# Patient Record
Sex: Male | Born: 1945 | Race: White | Hispanic: No | Marital: Married | Smoking: Never smoker
Health system: Southern US, Community
[De-identification: ages and names within clinical notes are randomized; demographics above are authoritative.]

## PROBLEM LIST (undated history)

## (undated) DIAGNOSIS — Z86010 Personal history of colonic polyps: Secondary | ICD-10-CM

## (undated) DIAGNOSIS — C8590 Non-Hodgkin lymphoma, unspecified, unspecified site: Secondary | ICD-10-CM

## (undated) DIAGNOSIS — D649 Anemia, unspecified: Secondary | ICD-10-CM

## (undated) DIAGNOSIS — K219 Gastro-esophageal reflux disease without esophagitis: Secondary | ICD-10-CM

## (undated) DIAGNOSIS — I2510 Atherosclerotic heart disease of native coronary artery without angina pectoris: Secondary | ICD-10-CM

## (undated) DIAGNOSIS — F329 Major depressive disorder, single episode, unspecified: Secondary | ICD-10-CM

## (undated) DIAGNOSIS — F32A Depression, unspecified: Secondary | ICD-10-CM

## (undated) DIAGNOSIS — E785 Hyperlipidemia, unspecified: Secondary | ICD-10-CM

## (undated) DIAGNOSIS — M419 Scoliosis, unspecified: Secondary | ICD-10-CM

## (undated) DIAGNOSIS — M6740 Ganglion, unspecified site: Secondary | ICD-10-CM

## (undated) DIAGNOSIS — M8580 Other specified disorders of bone density and structure, unspecified site: Secondary | ICD-10-CM

## (undated) DIAGNOSIS — M4800 Spinal stenosis, site unspecified: Secondary | ICD-10-CM

## (undated) DIAGNOSIS — I10 Essential (primary) hypertension: Secondary | ICD-10-CM

## (undated) DIAGNOSIS — F419 Anxiety disorder, unspecified: Secondary | ICD-10-CM

## (undated) DIAGNOSIS — C4491 Basal cell carcinoma of skin, unspecified: Secondary | ICD-10-CM

## (undated) DIAGNOSIS — K4090 Unilateral inguinal hernia, without obstruction or gangrene, not specified as recurrent: Secondary | ICD-10-CM

## (undated) DIAGNOSIS — N400 Enlarged prostate without lower urinary tract symptoms: Secondary | ICD-10-CM

## (undated) HISTORY — DX: Anxiety disorder, unspecified: F41.9

## (undated) HISTORY — DX: Non-Hodgkin lymphoma, unspecified, unspecified site: C85.90

## (undated) HISTORY — PX: CERVICAL SPINE SURGERY: SHX589

## (undated) HISTORY — DX: Unilateral inguinal hernia, without obstruction or gangrene, not specified as recurrent: K40.90

## (undated) HISTORY — PX: HERNIA REPAIR: SHX51

## (undated) HISTORY — DX: Gastro-esophageal reflux disease without esophagitis: K21.9

## (undated) HISTORY — PX: CORONARY ARTERY BYPASS GRAFT: SHX141

## (undated) HISTORY — DX: Spinal stenosis, site unspecified: M48.00

## (undated) HISTORY — PX: RHINOPLASTY: SUR1284

## (undated) HISTORY — DX: Personal history of colonic polyps: Z86.010

## (undated) HISTORY — DX: Other specified disorders of bone density and structure, unspecified site: M85.80

## (undated) HISTORY — DX: Ganglion, unspecified site: M67.40

## (undated) HISTORY — PX: UPPER GASTROINTESTINAL ENDOSCOPY: SHX188

## (undated) HISTORY — DX: Anemia, unspecified: D64.9

## (undated) HISTORY — PX: OTHER SURGICAL HISTORY: SHX169

## (undated) HISTORY — DX: Depression, unspecified: F32.A

## (undated) HISTORY — DX: Benign prostatic hyperplasia without lower urinary tract symptoms: N40.0

## (undated) HISTORY — DX: Basal cell carcinoma of skin, unspecified: C44.91

## (undated) HISTORY — DX: Essential (primary) hypertension: I10

## (undated) HISTORY — DX: Hyperlipidemia, unspecified: E78.5

## (undated) HISTORY — DX: Scoliosis, unspecified: M41.9

## (undated) HISTORY — DX: Major depressive disorder, single episode, unspecified: F32.9

## (undated) HISTORY — DX: Atherosclerotic heart disease of native coronary artery without angina pectoris: I25.10

---

## 1997-06-18 HISTORY — PX: CORONARY ARTERY BYPASS GRAFT: SHX141

## 1997-12-27 ENCOUNTER — Ambulatory Visit (HOSPITAL_COMMUNITY): Admission: RE | Admit: 1997-12-27 | Discharge: 1997-12-27 | Payer: Self-pay | Admitting: Neurological Surgery

## 1998-01-11 ENCOUNTER — Ambulatory Visit (HOSPITAL_COMMUNITY): Admission: RE | Admit: 1998-01-11 | Discharge: 1998-01-11 | Payer: Self-pay | Admitting: Neurological Surgery

## 1998-01-26 ENCOUNTER — Ambulatory Visit (HOSPITAL_COMMUNITY): Admission: RE | Admit: 1998-01-26 | Discharge: 1998-01-26 | Payer: Self-pay | Admitting: Neurological Surgery

## 1998-04-19 ENCOUNTER — Inpatient Hospital Stay (HOSPITAL_COMMUNITY): Admission: AD | Admit: 1998-04-19 | Discharge: 1998-05-01 | Payer: Self-pay | Admitting: Cardiology

## 1998-04-22 ENCOUNTER — Encounter: Payer: Self-pay | Admitting: Surgery

## 1998-04-25 ENCOUNTER — Encounter: Payer: Self-pay | Admitting: Surgery

## 1998-04-26 ENCOUNTER — Encounter: Payer: Self-pay | Admitting: Surgery

## 1998-04-27 ENCOUNTER — Encounter: Payer: Self-pay | Admitting: Surgery

## 1998-05-07 ENCOUNTER — Encounter: Payer: Self-pay | Admitting: Internal Medicine

## 1998-05-07 ENCOUNTER — Inpatient Hospital Stay (HOSPITAL_COMMUNITY): Admission: EM | Admit: 1998-05-07 | Discharge: 1998-05-08 | Payer: Self-pay | Admitting: Emergency Medicine

## 1998-06-03 ENCOUNTER — Encounter (HOSPITAL_COMMUNITY): Admission: RE | Admit: 1998-06-03 | Discharge: 1998-09-01 | Payer: Self-pay | Admitting: Internal Medicine

## 1998-07-27 ENCOUNTER — Ambulatory Visit (HOSPITAL_COMMUNITY): Admission: RE | Admit: 1998-07-27 | Discharge: 1998-07-27 | Payer: Self-pay | Admitting: Gastroenterology

## 2000-06-18 HISTORY — PX: OTHER SURGICAL HISTORY: SHX169

## 2000-12-11 ENCOUNTER — Encounter (INDEPENDENT_AMBULATORY_CARE_PROVIDER_SITE_OTHER): Payer: Self-pay | Admitting: Specialist

## 2000-12-11 ENCOUNTER — Other Ambulatory Visit: Admission: RE | Admit: 2000-12-11 | Discharge: 2000-12-11 | Payer: Self-pay | Admitting: Urology

## 2002-09-24 ENCOUNTER — Encounter: Admission: RE | Admit: 2002-09-24 | Discharge: 2002-09-24 | Payer: Self-pay | Admitting: Neurological Surgery

## 2002-09-24 ENCOUNTER — Encounter: Payer: Self-pay | Admitting: Neurological Surgery

## 2002-11-20 ENCOUNTER — Encounter: Payer: Self-pay | Admitting: Neurological Surgery

## 2002-11-20 ENCOUNTER — Encounter: Admission: RE | Admit: 2002-11-20 | Discharge: 2002-11-20 | Payer: Self-pay | Admitting: Neurological Surgery

## 2003-04-15 ENCOUNTER — Encounter: Admission: RE | Admit: 2003-04-15 | Discharge: 2003-04-15 | Payer: Self-pay | Admitting: Surgery

## 2003-04-19 ENCOUNTER — Ambulatory Visit (HOSPITAL_COMMUNITY): Admission: RE | Admit: 2003-04-19 | Discharge: 2003-04-19 | Payer: Self-pay | Admitting: Surgery

## 2003-04-19 ENCOUNTER — Ambulatory Visit (HOSPITAL_BASED_OUTPATIENT_CLINIC_OR_DEPARTMENT_OTHER): Admission: RE | Admit: 2003-04-19 | Discharge: 2003-04-19 | Payer: Self-pay | Admitting: Surgery

## 2003-05-12 ENCOUNTER — Encounter: Admission: RE | Admit: 2003-05-12 | Discharge: 2003-05-12 | Payer: Self-pay | Admitting: Internal Medicine

## 2003-09-15 ENCOUNTER — Encounter: Admission: RE | Admit: 2003-09-15 | Discharge: 2003-09-15 | Payer: Self-pay | Admitting: Neurological Surgery

## 2004-02-18 ENCOUNTER — Encounter: Admission: RE | Admit: 2004-02-18 | Discharge: 2004-02-18 | Payer: Self-pay | Admitting: Neurological Surgery

## 2004-03-31 ENCOUNTER — Encounter: Admission: RE | Admit: 2004-03-31 | Discharge: 2004-03-31 | Payer: Self-pay | Admitting: Internal Medicine

## 2004-07-05 ENCOUNTER — Encounter: Admission: RE | Admit: 2004-07-05 | Discharge: 2004-07-05 | Payer: Self-pay | Admitting: Internal Medicine

## 2004-10-04 ENCOUNTER — Ambulatory Visit: Payer: Self-pay

## 2004-11-06 ENCOUNTER — Ambulatory Visit: Payer: Self-pay | Admitting: Internal Medicine

## 2004-11-08 ENCOUNTER — Ambulatory Visit: Payer: Self-pay | Admitting: Internal Medicine

## 2005-05-29 ENCOUNTER — Ambulatory Visit: Payer: Self-pay | Admitting: Internal Medicine

## 2005-11-27 ENCOUNTER — Ambulatory Visit: Payer: Self-pay | Admitting: Oncology

## 2005-11-27 LAB — CBC WITH DIFFERENTIAL/PLATELET
BASO%: 0.9 % (ref 0.0–2.0)
Basophils Absolute: 0.1 10*3/uL (ref 0.0–0.1)
EOS%: 0.8 % (ref 0.0–7.0)
Eosinophils Absolute: 0.1 10*3/uL (ref 0.0–0.5)
HCT: 39.8 % (ref 38.7–49.9)
HGB: 13.7 g/dL (ref 13.0–17.1)
LYMPH%: 14.8 % (ref 14.0–48.0)
MCH: 30.5 pg (ref 28.0–33.4)
MCHC: 34.5 g/dL (ref 32.0–35.9)
MCV: 88.5 fL (ref 81.6–98.0)
MONO#: 0.6 10*3/uL (ref 0.1–0.9)
MONO%: 7.5 % (ref 0.0–13.0)
NEUT#: 6.2 10*3/uL (ref 1.5–6.5)
NEUT%: 76 % — ABNORMAL HIGH (ref 40.0–75.0)
Platelets: 211 10*3/uL (ref 145–400)
RBC: 4.5 10*6/uL (ref 4.20–5.71)
RDW: 13.4 % (ref 11.2–14.6)
WBC: 8.2 10*3/uL (ref 4.0–10.0)
lymph#: 1.2 10*3/uL (ref 0.9–3.3)

## 2005-11-29 LAB — CEA: CEA: <0.5 ng/mL (ref 0.0–5.0)

## 2005-11-29 LAB — COMPREHENSIVE METABOLIC PANEL
ALT: 11 U/L (ref 0–40)
AST: 15 U/L (ref 0–37)
Albumin: 4.6 g/dL (ref 3.5–5.2)
Alkaline Phosphatase: 61 U/L (ref 39–117)
BUN: 17 mg/dL (ref 6–23)
CO2: 28 mEq/L (ref 19–32)
Calcium: 9.5 mg/dL (ref 8.4–10.5)
Chloride: 102 mEq/L (ref 96–112)
Creatinine, Ser: 1.22 mg/dL (ref 0.40–1.50)
Glucose, Bld: 88 mg/dL (ref 70–99)
Potassium: 4.3 mEq/L (ref 3.5–5.3)
Sodium: 139 mEq/L (ref 135–145)
Total Bilirubin: 0.8 mg/dL (ref 0.3–1.2)
Total Protein: 7.2 g/dL (ref 6.0–8.3)

## 2005-11-29 LAB — IMMUNOFIXATION ELECTROPHORESIS
IgA: 153 mg/dL (ref 68–378)
IgG (Immunoglobin G), Serum: 1000 mg/dL (ref 694–1618)
IgM, Serum: 144 mg/dL (ref 60–263)
Total Protein, Serum Electrophoresis: 7.2 g/dL (ref 6.0–8.3)

## 2005-11-29 LAB — LACTATE DEHYDROGENASE: LDH: 133 U/L (ref 94–250)

## 2005-11-29 LAB — BETA 2 MICROGLOBULIN, SERUM: Beta-2 Microglobulin: 1.74 mg/L — ABNORMAL HIGH (ref 1.01–1.73)

## 2005-11-30 ENCOUNTER — Ambulatory Visit (HOSPITAL_COMMUNITY): Admission: RE | Admit: 2005-11-30 | Discharge: 2005-11-30 | Payer: Self-pay | Admitting: Oncology

## 2005-12-03 ENCOUNTER — Ambulatory Visit (HOSPITAL_COMMUNITY): Admission: RE | Admit: 2005-12-03 | Discharge: 2005-12-03 | Payer: Self-pay | Admitting: Oncology

## 2005-12-03 ENCOUNTER — Encounter (INDEPENDENT_AMBULATORY_CARE_PROVIDER_SITE_OTHER): Payer: Self-pay | Admitting: *Deleted

## 2005-12-03 ENCOUNTER — Encounter (INDEPENDENT_AMBULATORY_CARE_PROVIDER_SITE_OTHER): Payer: Self-pay | Admitting: Interventional Radiology

## 2005-12-24 ENCOUNTER — Ambulatory Visit: Payer: Self-pay | Admitting: Internal Medicine

## 2006-01-14 ENCOUNTER — Ambulatory Visit: Payer: Self-pay | Admitting: Oncology

## 2006-01-15 LAB — CBC WITH DIFFERENTIAL/PLATELET
BASO%: 1.1 % (ref 0.0–2.0)
Basophils Absolute: 0.1 10*3/uL (ref 0.0–0.1)
EOS%: 1.6 % (ref 0.0–7.0)
Eosinophils Absolute: 0.1 10*3/uL (ref 0.0–0.5)
HCT: 40.2 % (ref 38.7–49.9)
HGB: 14.1 g/dL (ref 13.0–17.1)
LYMPH%: 24.2 % (ref 14.0–48.0)
MCH: 30.4 pg (ref 28.0–33.4)
MCHC: 35 g/dL (ref 32.0–35.9)
MCV: 86.7 fL (ref 81.6–98.0)
MONO#: 0.5 10*3/uL (ref 0.1–0.9)
MONO%: 10 % (ref 0.0–13.0)
NEUT#: 3 10*3/uL (ref 1.5–6.5)
NEUT%: 63.2 % (ref 40.0–75.0)
Platelets: 202 10*3/uL (ref 145–400)
RBC: 4.63 10*6/uL (ref 4.20–5.71)
RDW: 11.4 % (ref 11.2–14.6)
WBC: 4.8 10*3/uL (ref 4.0–10.0)
lymph#: 1.2 10*3/uL (ref 0.9–3.3)

## 2006-01-22 LAB — CBC WITH DIFFERENTIAL/PLATELET
BASO%: 1.6 % (ref 0.0–2.0)
Basophils Absolute: 0.1 10*3/uL (ref 0.0–0.1)
EOS%: 1.8 % (ref 0.0–7.0)
Eosinophils Absolute: 0.1 10*3/uL (ref 0.0–0.5)
HCT: 38.3 % — ABNORMAL LOW (ref 38.7–49.9)
HGB: 13.4 g/dL (ref 13.0–17.1)
LYMPH%: 18.6 % (ref 14.0–48.0)
MCH: 30.8 pg (ref 28.0–33.4)
MCHC: 35.1 g/dL (ref 32.0–35.9)
MCV: 87.6 fL (ref 81.6–98.0)
MONO#: 0.5 10*3/uL (ref 0.1–0.9)
MONO%: 10.5 % (ref 0.0–13.0)
NEUT#: 3.4 10*3/uL (ref 1.5–6.5)
NEUT%: 67.5 % (ref 40.0–75.0)
Platelets: 186 10*3/uL (ref 145–400)
RBC: 4.37 10*6/uL (ref 4.20–5.71)
RDW: 11.9 % (ref 11.2–14.6)
WBC: 5.1 10*3/uL (ref 4.0–10.0)
lymph#: 0.9 10*3/uL (ref 0.9–3.3)

## 2006-01-23 ENCOUNTER — Ambulatory Visit: Payer: Self-pay

## 2006-01-24 ENCOUNTER — Ambulatory Visit: Payer: Self-pay

## 2006-01-28 LAB — CBC WITH DIFFERENTIAL/PLATELET
BASO%: 0.9 % (ref 0.0–2.0)
Basophils Absolute: 0.1 10*3/uL (ref 0.0–0.1)
EOS%: 0.5 % (ref 0.0–7.0)
Eosinophils Absolute: 0 10*3/uL (ref 0.0–0.5)
HCT: 39.7 % (ref 38.7–49.9)
HGB: 13.7 g/dL (ref 13.0–17.1)
LYMPH%: 15.9 % (ref 14.0–48.0)
MCH: 30.9 pg (ref 28.0–33.4)
MCHC: 34.5 g/dL (ref 32.0–35.9)
MCV: 89.6 fL (ref 81.6–98.0)
MONO#: 0.7 10*3/uL (ref 0.1–0.9)
MONO%: 10.8 % (ref 0.0–13.0)
NEUT#: 4.4 10*3/uL (ref 1.5–6.5)
NEUT%: 71.9 % (ref 40.0–75.0)
Platelets: 223 10*3/uL (ref 145–400)
RBC: 4.43 10*6/uL (ref 4.20–5.71)
RDW: 13.4 % (ref 11.2–14.6)
WBC: 6.1 10*3/uL (ref 4.0–10.0)
lymph#: 1 10*3/uL (ref 0.9–3.3)

## 2006-02-04 ENCOUNTER — Ambulatory Visit: Payer: Self-pay | Admitting: Internal Medicine

## 2006-02-05 LAB — CBC WITH DIFFERENTIAL/PLATELET
BASO%: 1.7 % (ref 0.0–2.0)
Basophils Absolute: 0.1 10*3/uL (ref 0.0–0.1)
EOS%: 1.5 % (ref 0.0–7.0)
Eosinophils Absolute: 0.1 10*3/uL (ref 0.0–0.5)
HCT: 40.1 % (ref 38.7–49.9)
HGB: 14 g/dL (ref 13.0–17.1)
LYMPH%: 22 % (ref 14.0–48.0)
MCH: 30.9 pg (ref 28.0–33.4)
MCHC: 34.9 g/dL (ref 32.0–35.9)
MCV: 88.6 fL (ref 81.6–98.0)
MONO#: 0.7 10*3/uL (ref 0.1–0.9)
MONO%: 14.1 % — ABNORMAL HIGH (ref 0.0–13.0)
NEUT#: 2.8 10*3/uL (ref 1.5–6.5)
NEUT%: 60.8 % (ref 40.0–75.0)
Platelets: 209 10*3/uL (ref 145–400)
RBC: 4.52 10*6/uL (ref 4.20–5.71)
RDW: 12.2 % (ref 11.2–14.6)
WBC: 4.7 10*3/uL (ref 4.0–10.0)
lymph#: 1 10*3/uL (ref 0.9–3.3)

## 2006-02-11 LAB — CBC WITH DIFFERENTIAL/PLATELET
BASO%: 0.7 % (ref 0.0–2.0)
Basophils Absolute: 0.1 10*3/uL (ref 0.0–0.1)
EOS%: 0.4 % (ref 0.0–7.0)
Eosinophils Absolute: 0 10*3/uL (ref 0.0–0.5)
HCT: 37.7 % — ABNORMAL LOW (ref 38.7–49.9)
HGB: 13.5 g/dL (ref 13.0–17.1)
LYMPH%: 12 % — ABNORMAL LOW (ref 14.0–48.0)
MCH: 31.3 pg (ref 28.0–33.4)
MCHC: 35.7 g/dL (ref 32.0–35.9)
MCV: 87.6 fL (ref 81.6–98.0)
MONO#: 0.6 10*3/uL (ref 0.1–0.9)
MONO%: 7.3 % (ref 0.0–13.0)
NEUT#: 6.5 10*3/uL (ref 1.5–6.5)
NEUT%: 79.6 % — ABNORMAL HIGH (ref 40.0–75.0)
Platelets: 203 10*3/uL (ref 145–400)
RBC: 4.3 10*6/uL (ref 4.20–5.71)
RDW: 11.7 % (ref 11.2–14.6)
WBC: 8.1 10*3/uL (ref 4.0–10.0)
lymph#: 1 10*3/uL (ref 0.9–3.3)

## 2006-04-19 ENCOUNTER — Ambulatory Visit: Payer: Self-pay | Admitting: Oncology

## 2006-04-23 LAB — CBC WITH DIFFERENTIAL/PLATELET
BASO%: 0.7 % (ref 0.0–2.0)
Basophils Absolute: 0 10*3/uL (ref 0.0–0.1)
EOS%: 1.3 % (ref 0.0–7.0)
Eosinophils Absolute: 0.1 10*3/uL (ref 0.0–0.5)
HCT: 39.1 % (ref 38.7–49.9)
HGB: 13.5 g/dL (ref 13.0–17.1)
LYMPH%: 18.2 % (ref 14.0–48.0)
MCH: 31.1 pg (ref 28.0–33.4)
MCHC: 34.5 g/dL (ref 32.0–35.9)
MCV: 90 fL (ref 81.6–98.0)
MONO#: 0.5 10*3/uL (ref 0.1–0.9)
MONO%: 8.7 % (ref 0.0–13.0)
NEUT#: 4.2 10*3/uL (ref 1.5–6.5)
NEUT%: 71.1 % (ref 40.0–75.0)
Platelets: 213 10*3/uL (ref 145–400)
RBC: 4.35 10*6/uL (ref 4.20–5.71)
RDW: 12.9 % (ref 11.2–14.6)
WBC: 5.9 10*3/uL (ref 4.0–10.0)
lymph#: 1.1 10*3/uL (ref 0.9–3.3)

## 2006-04-24 LAB — COMPREHENSIVE METABOLIC PANEL
ALT: 16 U/L (ref 0–53)
AST: 17 U/L (ref 0–37)
Albumin: 4.4 g/dL (ref 3.5–5.2)
Alkaline Phosphatase: 77 U/L (ref 39–117)
BUN: 11 mg/dL (ref 6–23)
CO2: 31 mEq/L (ref 19–32)
Calcium: 8.9 mg/dL (ref 8.4–10.5)
Chloride: 100 mEq/L (ref 96–112)
Creatinine, Ser: 1.03 mg/dL (ref 0.40–1.50)
Glucose, Bld: 85 mg/dL (ref 70–99)
Potassium: 4.1 mEq/L (ref 3.5–5.3)
Sodium: 141 mEq/L (ref 135–145)
Total Bilirubin: 0.6 mg/dL (ref 0.3–1.2)
Total Protein: 6.8 g/dL (ref 6.0–8.3)

## 2006-04-24 LAB — BETA 2 MICROGLOBULIN, SERUM: Beta-2 Microglobulin: 1.64 mg/L (ref 1.01–1.73)

## 2006-04-24 LAB — LACTATE DEHYDROGENASE: LDH: 116 U/L (ref 94–250)

## 2006-05-13 LAB — CBC WITH DIFFERENTIAL/PLATELET
BASO%: 1.3 % (ref 0.0–2.0)
Basophils Absolute: 0.1 10*3/uL (ref 0.0–0.1)
EOS%: 1.1 % (ref 0.0–7.0)
Eosinophils Absolute: 0.1 10*3/uL (ref 0.0–0.5)
HCT: 42.1 % (ref 38.7–49.9)
HGB: 14.7 g/dL (ref 13.0–17.1)
LYMPH%: 15.2 % (ref 14.0–48.0)
MCH: 30.6 pg (ref 28.0–33.4)
MCHC: 35.1 g/dL (ref 32.0–35.9)
MCV: 87.2 fL (ref 81.6–98.0)
MONO#: 0.5 10*3/uL (ref 0.1–0.9)
MONO%: 8.8 % (ref 0.0–13.0)
NEUT#: 4.4 10*3/uL (ref 1.5–6.5)
NEUT%: 73.7 % (ref 40.0–75.0)
Platelets: 223 10*3/uL (ref 145–400)
RBC: 4.82 10*6/uL (ref 4.20–5.71)
RDW: 11.3 % (ref 11.2–14.6)
WBC: 6 10*3/uL (ref 4.0–10.0)
lymph#: 0.9 10*3/uL (ref 0.9–3.3)

## 2006-05-21 LAB — CBC WITH DIFFERENTIAL/PLATELET
BASO%: 1 % (ref 0.0–2.0)
Basophils Absolute: 0 10*3/uL (ref 0.0–0.1)
EOS%: 1.6 % (ref 0.0–7.0)
Eosinophils Absolute: 0.1 10*3/uL (ref 0.0–0.5)
HCT: 41 % (ref 38.7–49.9)
HGB: 14.3 g/dL (ref 13.0–17.1)
LYMPH%: 17.8 % (ref 14.0–48.0)
MCH: 30.4 pg (ref 28.0–33.4)
MCHC: 34.9 g/dL (ref 32.0–35.9)
MCV: 87.1 fL (ref 81.6–98.0)
MONO#: 0.4 10*3/uL (ref 0.1–0.9)
MONO%: 10.5 % (ref 0.0–13.0)
NEUT#: 3 10*3/uL (ref 1.5–6.5)
NEUT%: 69.1 % (ref 40.0–75.0)
Platelets: 209 10*3/uL (ref 145–400)
RBC: 4.7 10*6/uL (ref 4.20–5.71)
RDW: 11.1 % — ABNORMAL LOW (ref 11.2–14.6)
WBC: 4.3 10*3/uL (ref 4.0–10.0)
lymph#: 0.8 10*3/uL — ABNORMAL LOW (ref 0.9–3.3)

## 2006-05-28 LAB — CBC WITH DIFFERENTIAL/PLATELET
BASO%: 0.7 % (ref 0.0–2.0)
Basophils Absolute: 0 10*3/uL (ref 0.0–0.1)
EOS%: 1.6 % (ref 0.0–7.0)
Eosinophils Absolute: 0.1 10*3/uL (ref 0.0–0.5)
HCT: 39.8 % (ref 38.7–49.9)
HGB: 14.2 g/dL (ref 13.0–17.1)
LYMPH%: 11.7 % — ABNORMAL LOW (ref 14.0–48.0)
MCH: 30.8 pg (ref 28.0–33.4)
MCHC: 35.6 g/dL (ref 32.0–35.9)
MCV: 86.4 fL (ref 81.6–98.0)
MONO#: 0.5 10*3/uL (ref 0.1–0.9)
MONO%: 8 % (ref 0.0–13.0)
NEUT#: 4.7 10*3/uL (ref 1.5–6.5)
NEUT%: 78.1 % — ABNORMAL HIGH (ref 40.0–75.0)
Platelets: 207 10*3/uL (ref 145–400)
RBC: 4.61 10*6/uL (ref 4.20–5.71)
RDW: 11.4 % (ref 11.2–14.6)
WBC: 6 10*3/uL (ref 4.0–10.0)
lymph#: 0.7 10*3/uL — ABNORMAL LOW (ref 0.9–3.3)

## 2006-06-04 ENCOUNTER — Ambulatory Visit: Payer: Self-pay | Admitting: Oncology

## 2006-06-04 LAB — CBC WITH DIFFERENTIAL/PLATELET
BASO%: 0.6 % (ref 0.0–2.0)
Basophils Absolute: 0 10*3/uL (ref 0.0–0.1)
EOS%: 1.2 % (ref 0.0–7.0)
Eosinophils Absolute: 0 10*3/uL (ref 0.0–0.5)
HCT: 38.9 % (ref 38.7–49.9)
HGB: 13.3 g/dL (ref 13.0–17.1)
LYMPH%: 18 % (ref 14.0–48.0)
MCH: 29.9 pg (ref 28.0–33.4)
MCHC: 34.2 g/dL (ref 32.0–35.9)
MCV: 87.4 fL (ref 81.6–98.0)
MONO#: 0.5 10*3/uL (ref 0.1–0.9)
MONO%: 12.4 % (ref 0.0–13.0)
NEUT#: 2.5 10*3/uL (ref 1.5–6.5)
NEUT%: 67.8 % (ref 40.0–75.0)
Platelets: 192 10*3/uL (ref 145–400)
RBC: 4.45 10*6/uL (ref 4.20–5.71)
RDW: 11.7 % (ref 11.2–14.6)
WBC: 3.7 10*3/uL — ABNORMAL LOW (ref 4.0–10.0)
lymph#: 0.7 10*3/uL — ABNORMAL LOW (ref 0.9–3.3)

## 2006-06-19 LAB — CBC WITH DIFFERENTIAL/PLATELET
BASO%: 0.7 % (ref 0.0–2.0)
Basophils Absolute: 0 10*3/uL (ref 0.0–0.1)
EOS%: 1.4 % (ref 0.0–7.0)
Eosinophils Absolute: 0.1 10*3/uL (ref 0.0–0.5)
HCT: 40.6 % (ref 38.7–49.9)
HGB: 14.3 g/dL (ref 13.0–17.1)
LYMPH%: 11.1 % — ABNORMAL LOW (ref 14.0–48.0)
MCH: 30.7 pg (ref 28.0–33.4)
MCHC: 35.2 g/dL (ref 32.0–35.9)
MCV: 87.2 fL (ref 81.6–98.0)
MONO#: 0.5 10*3/uL (ref 0.1–0.9)
MONO%: 10.7 % (ref 0.0–13.0)
NEUT#: 3.8 10*3/uL (ref 1.5–6.5)
NEUT%: 76.1 % — ABNORMAL HIGH (ref 40.0–75.0)
Platelets: 194 10*3/uL (ref 145–400)
RBC: 4.65 10*6/uL (ref 4.20–5.71)
RDW: 12.1 % (ref 11.2–14.6)
WBC: 5 10*3/uL (ref 4.0–10.0)
lymph#: 0.5 10*3/uL — ABNORMAL LOW (ref 0.9–3.3)

## 2006-06-19 LAB — COMPREHENSIVE METABOLIC PANEL
ALT: 24 U/L (ref 0–53)
AST: 23 U/L (ref 0–37)
Albumin: 4.9 g/dL (ref 3.5–5.2)
Alkaline Phosphatase: 89 U/L (ref 39–117)
BUN: 17 mg/dL (ref 6–23)
CO2: 28 mEq/L (ref 19–32)
Calcium: 9.4 mg/dL (ref 8.4–10.5)
Chloride: 102 mEq/L (ref 96–112)
Creatinine, Ser: 1.05 mg/dL (ref 0.40–1.50)
Glucose, Bld: 107 mg/dL — ABNORMAL HIGH (ref 70–99)
Potassium: 3.9 mEq/L (ref 3.5–5.3)
Sodium: 140 mEq/L (ref 135–145)
Total Bilirubin: 0.9 mg/dL (ref 0.3–1.2)
Total Protein: 7.5 g/dL (ref 6.0–8.3)

## 2006-06-19 LAB — IGG, IGA, IGM
IgA: 163 mg/dL (ref 68–378)
IgG (Immunoglobin G), Serum: 1140 mg/dL (ref 694–1618)
IgM, Serum: 138 mg/dL (ref 60–263)

## 2006-06-19 LAB — BETA 2 MICROGLOBULIN, SERUM: Beta-2 Microglobulin: 1.73 mg/L (ref 1.01–1.73)

## 2006-09-05 ENCOUNTER — Ambulatory Visit: Payer: Self-pay | Admitting: Oncology

## 2006-09-09 LAB — CBC WITH DIFFERENTIAL/PLATELET
BASO%: 0.4 % (ref 0.0–2.0)
Basophils Absolute: 0 10*3/uL (ref 0.0–0.1)
EOS%: 0.7 % (ref 0.0–7.0)
Eosinophils Absolute: 0 10*3/uL (ref 0.0–0.5)
HCT: 41.9 % (ref 38.7–49.9)
HGB: 14.9 g/dL (ref 13.0–17.1)
LYMPH%: 12.4 % — ABNORMAL LOW (ref 14.0–48.0)
MCH: 31.5 pg (ref 28.0–33.4)
MCHC: 35.5 g/dL (ref 32.0–35.9)
MCV: 88.7 fL (ref 81.6–98.0)
MONO#: 0.7 10*3/uL (ref 0.1–0.9)
MONO%: 10.4 % (ref 0.0–13.0)
NEUT#: 4.8 10*3/uL (ref 1.5–6.5)
NEUT%: 76.1 % — ABNORMAL HIGH (ref 40.0–75.0)
Platelets: 182 10*3/uL (ref 145–400)
RBC: 4.73 10*6/uL (ref 4.20–5.71)
RDW: 12.7 % (ref 11.2–14.6)
WBC: 6.3 10*3/uL (ref 4.0–10.0)
lymph#: 0.8 10*3/uL — ABNORMAL LOW (ref 0.9–3.3)

## 2006-09-10 LAB — COMPREHENSIVE METABOLIC PANEL
ALT: 12 U/L (ref 0–53)
AST: 15 U/L (ref 0–37)
Albumin: 4.5 g/dL (ref 3.5–5.2)
Alkaline Phosphatase: 86 U/L (ref 39–117)
BUN: 12 mg/dL (ref 6–23)
CO2: 29 mEq/L (ref 19–32)
Calcium: 9.1 mg/dL (ref 8.4–10.5)
Chloride: 99 mEq/L (ref 96–112)
Creatinine, Ser: 0.98 mg/dL (ref 0.40–1.50)
Glucose, Bld: 73 mg/dL (ref 70–99)
Potassium: 4 mEq/L (ref 3.5–5.3)
Sodium: 138 mEq/L (ref 135–145)
Total Bilirubin: 0.9 mg/dL (ref 0.3–1.2)
Total Protein: 7.1 g/dL (ref 6.0–8.3)

## 2006-09-10 LAB — IGG, IGA, IGM
IgA: 143 mg/dL (ref 68–378)
IgG (Immunoglobin G), Serum: 880 mg/dL (ref 694–1618)
IgM, Serum: 130 mg/dL (ref 60–263)

## 2006-09-10 LAB — BETA 2 MICROGLOBULIN, SERUM: Beta-2 Microglobulin: 1.55 mg/L (ref 1.01–1.73)

## 2006-09-10 LAB — LACTATE DEHYDROGENASE: LDH: 123 U/L (ref 94–250)

## 2007-01-27 ENCOUNTER — Ambulatory Visit: Payer: Self-pay | Admitting: Internal Medicine

## 2007-01-28 ENCOUNTER — Ambulatory Visit: Payer: Self-pay

## 2007-03-07 ENCOUNTER — Encounter: Payer: Self-pay | Admitting: Internal Medicine

## 2007-03-13 ENCOUNTER — Ambulatory Visit (HOSPITAL_BASED_OUTPATIENT_CLINIC_OR_DEPARTMENT_OTHER): Admission: RE | Admit: 2007-03-13 | Discharge: 2007-03-13 | Payer: Self-pay | Admitting: Orthopedic Surgery

## 2007-03-13 ENCOUNTER — Encounter (INDEPENDENT_AMBULATORY_CARE_PROVIDER_SITE_OTHER): Payer: Self-pay | Admitting: Orthopedic Surgery

## 2007-03-18 ENCOUNTER — Ambulatory Visit: Payer: Self-pay | Admitting: Oncology

## 2007-03-20 LAB — CBC WITH DIFFERENTIAL/PLATELET
BASO%: 0.5 % (ref 0.0–2.0)
Basophils Absolute: 0 10*3/uL (ref 0.0–0.1)
EOS%: 2.2 % (ref 0.0–7.0)
Eosinophils Absolute: 0.1 10*3/uL (ref 0.0–0.5)
HCT: 40.1 % (ref 38.7–49.9)
HGB: 14.2 g/dL (ref 13.0–17.1)
LYMPH%: 14.4 % (ref 14.0–48.0)
MCH: 32 pg (ref 28.0–33.4)
MCHC: 35.4 g/dL (ref 32.0–35.9)
MCV: 90.6 fL (ref 81.6–98.0)
MONO#: 0.5 10*3/uL (ref 0.1–0.9)
MONO%: 10.9 % (ref 0.0–13.0)
NEUT#: 3.2 10*3/uL (ref 1.5–6.5)
NEUT%: 72 % (ref 40.0–75.0)
Platelets: 221 10*3/uL (ref 145–400)
RBC: 4.43 10*6/uL (ref 4.20–5.71)
RDW: 12.9 % (ref 11.2–14.6)
WBC: 4.4 10*3/uL (ref 4.0–10.0)
lymph#: 0.6 10*3/uL — ABNORMAL LOW (ref 0.9–3.3)

## 2007-03-20 LAB — IGG, IGA, IGM
IgA: 150 mg/dL (ref 68–378)
IgG (Immunoglobin G), Serum: 989 mg/dL (ref 694–1618)
IgM, Serum: 125 mg/dL (ref 60–263)

## 2007-03-20 LAB — BETA 2 MICROGLOBULIN, SERUM: Beta-2 Microglobulin: 1.58 mg/L (ref 1.01–1.73)

## 2007-03-20 LAB — COMPREHENSIVE METABOLIC PANEL
ALT: 14 U/L (ref 0–53)
AST: 15 U/L (ref 0–37)
Albumin: 4.4 g/dL (ref 3.5–5.2)
Alkaline Phosphatase: 77 U/L (ref 39–117)
BUN: 18 mg/dL (ref 6–23)
CO2: 28 mEq/L (ref 19–32)
Calcium: 9.1 mg/dL (ref 8.4–10.5)
Chloride: 101 mEq/L (ref 96–112)
Creatinine, Ser: 1.08 mg/dL (ref 0.40–1.50)
Glucose, Bld: 79 mg/dL (ref 70–99)
Potassium: 4.8 mEq/L (ref 3.5–5.3)
Sodium: 137 mEq/L (ref 135–145)
Total Bilirubin: 0.9 mg/dL (ref 0.3–1.2)
Total Protein: 6.8 g/dL (ref 6.0–8.3)

## 2007-03-27 ENCOUNTER — Ambulatory Visit (HOSPITAL_COMMUNITY): Admission: RE | Admit: 2007-03-27 | Discharge: 2007-03-27 | Payer: Self-pay | Admitting: Oncology

## 2007-06-17 ENCOUNTER — Ambulatory Visit: Payer: Self-pay | Admitting: Oncology

## 2007-06-20 LAB — CBC WITH DIFFERENTIAL/PLATELET
BASO%: 0.4 % (ref 0.0–2.0)
Basophils Absolute: 0 10*3/uL (ref 0.0–0.1)
EOS%: 1.6 % (ref 0.0–7.0)
Eosinophils Absolute: 0.1 10*3/uL (ref 0.0–0.5)
HCT: 39.6 % (ref 38.7–49.9)
HGB: 13.9 g/dL (ref 13.0–17.1)
LYMPH%: 14.9 % (ref 14.0–48.0)
MCH: 31.5 pg (ref 28.0–33.4)
MCHC: 35 g/dL (ref 32.0–35.9)
MCV: 89.9 fL (ref 81.6–98.0)
MONO#: 0.4 10*3/uL (ref 0.1–0.9)
MONO%: 9 % (ref 0.0–13.0)
NEUT#: 3.6 10*3/uL (ref 1.5–6.5)
NEUT%: 74.1 % (ref 40.0–75.0)
Platelets: 187 10*3/uL (ref 145–400)
RBC: 4.4 10*6/uL (ref 4.20–5.71)
RDW: 13.5 % (ref 11.2–14.6)
WBC: 4.9 10*3/uL (ref 4.0–10.0)
lymph#: 0.7 10*3/uL — ABNORMAL LOW (ref 0.9–3.3)

## 2007-06-20 LAB — MORPHOLOGY
PLT EST: ADEQUATE
RBC Comments: NORMAL

## 2007-06-20 LAB — COMPREHENSIVE METABOLIC PANEL
ALT: 19 U/L (ref 0–53)
AST: 17 U/L (ref 0–37)
Albumin: 4.6 g/dL (ref 3.5–5.2)
Alkaline Phosphatase: 77 U/L (ref 39–117)
BUN: 16 mg/dL (ref 6–23)
CO2: 27 mEq/L (ref 19–32)
Calcium: 9.4 mg/dL (ref 8.4–10.5)
Chloride: 99 mEq/L (ref 96–112)
Creatinine, Ser: 1.07 mg/dL (ref 0.40–1.50)
Glucose, Bld: 109 mg/dL — ABNORMAL HIGH (ref 70–99)
Potassium: 4.1 mEq/L (ref 3.5–5.3)
Sodium: 140 mEq/L (ref 135–145)
Total Bilirubin: 0.9 mg/dL (ref 0.3–1.2)
Total Protein: 7.1 g/dL (ref 6.0–8.3)

## 2007-06-20 LAB — IGG, IGA, IGM
IgA: 126 mg/dL (ref 68–378)
IgG (Immunoglobin G), Serum: 917 mg/dL (ref 694–1618)
IgM, Serum: 111 mg/dL (ref 60–263)

## 2007-06-20 LAB — BETA 2 MICROGLOBULIN, SERUM: Beta-2 Microglobulin: 1.61 mg/L (ref 1.01–1.73)

## 2007-06-20 LAB — URIC ACID: Uric Acid, Serum: 6 mg/dL (ref 2.4–7.0)

## 2007-06-20 LAB — LACTATE DEHYDROGENASE: LDH: 130 U/L (ref 94–250)

## 2007-08-07 ENCOUNTER — Ambulatory Visit: Payer: Self-pay | Admitting: Internal Medicine

## 2007-09-15 ENCOUNTER — Ambulatory Visit: Payer: Self-pay | Admitting: Oncology

## 2007-09-17 LAB — CBC WITH DIFFERENTIAL/PLATELET
BASO%: 0.4 % (ref 0.0–2.0)
Basophils Absolute: 0 10*3/uL (ref 0.0–0.1)
EOS%: 1.4 % (ref 0.0–7.0)
Eosinophils Absolute: 0.1 10*3/uL (ref 0.0–0.5)
HCT: 37.8 % — ABNORMAL LOW (ref 38.7–49.9)
HGB: 13.5 g/dL (ref 13.0–17.1)
LYMPH%: 15.1 % (ref 14.0–48.0)
MCH: 32.4 pg (ref 28.0–33.4)
MCHC: 35.6 g/dL (ref 32.0–35.9)
MCV: 91 fL (ref 81.6–98.0)
MONO#: 0.7 10*3/uL (ref 0.1–0.9)
MONO%: 10.1 % (ref 0.0–13.0)
NEUT#: 5 10*3/uL (ref 1.5–6.5)
NEUT%: 73 % (ref 40.0–75.0)
Platelets: 250 10*3/uL (ref 145–400)
RBC: 4.16 10*6/uL — ABNORMAL LOW (ref 4.20–5.71)
RDW: 13.5 % (ref 11.2–14.6)
WBC: 6.9 10*3/uL (ref 4.0–10.0)
lymph#: 1 10*3/uL (ref 0.9–3.3)

## 2007-09-17 LAB — COMPREHENSIVE METABOLIC PANEL
ALT: 15 U/L (ref 0–53)
AST: 14 U/L (ref 0–37)
Albumin: 4.4 g/dL (ref 3.5–5.2)
Alkaline Phosphatase: 68 U/L (ref 39–117)
BUN: 18 mg/dL (ref 6–23)
CO2: 26 mEq/L (ref 19–32)
Calcium: 9.1 mg/dL (ref 8.4–10.5)
Chloride: 102 mEq/L (ref 96–112)
Creatinine, Ser: 0.97 mg/dL (ref 0.40–1.50)
Glucose, Bld: 89 mg/dL (ref 70–99)
Potassium: 4 mEq/L (ref 3.5–5.3)
Sodium: 138 mEq/L (ref 135–145)
Total Bilirubin: 0.6 mg/dL (ref 0.3–1.2)
Total Protein: 6.6 g/dL (ref 6.0–8.3)

## 2007-09-19 LAB — IMMUNOFIXATION ELECTROPHORESIS
IgA: 142 mg/dL (ref 68–378)
IgG (Immunoglobin G), Serum: 915 mg/dL (ref 694–1618)
IgM, Serum: 111 mg/dL (ref 60–263)
Total Protein, Serum Electrophoresis: 6.8 g/dL (ref 6.0–8.3)

## 2007-09-19 LAB — BETA 2 MICROGLOBULIN, SERUM: Beta-2 Microglobulin: 1.33 mg/L (ref 1.01–1.73)

## 2007-11-13 ENCOUNTER — Ambulatory Visit: Payer: Self-pay | Admitting: Oncology

## 2007-11-20 ENCOUNTER — Encounter: Payer: Self-pay | Admitting: Internal Medicine

## 2007-11-20 ENCOUNTER — Ambulatory Visit: Payer: Self-pay

## 2007-11-20 ENCOUNTER — Ambulatory Visit: Payer: Self-pay | Admitting: Internal Medicine

## 2008-02-04 ENCOUNTER — Encounter: Payer: Self-pay | Admitting: Internal Medicine

## 2008-02-04 ENCOUNTER — Ambulatory Visit: Payer: Self-pay

## 2008-03-26 ENCOUNTER — Ambulatory Visit (HOSPITAL_COMMUNITY): Admission: RE | Admit: 2008-03-26 | Discharge: 2008-03-26 | Payer: Self-pay | Admitting: Oncology

## 2008-05-07 ENCOUNTER — Ambulatory Visit: Payer: Self-pay | Admitting: Oncology

## 2008-05-12 LAB — CBC WITH DIFFERENTIAL/PLATELET
BASO%: 0.8 % (ref 0.0–2.0)
Basophils Absolute: 0 10*3/uL (ref 0.0–0.1)
EOS%: 1.8 % (ref 0.0–7.0)
Eosinophils Absolute: 0.1 10*3/uL (ref 0.0–0.5)
HCT: 37 % — ABNORMAL LOW (ref 38.7–49.9)
HGB: 13 g/dL (ref 13.0–17.1)
LYMPH%: 22.4 % (ref 14.0–48.0)
MCH: 32.6 pg (ref 28.0–33.4)
MCHC: 35.2 g/dL (ref 32.0–35.9)
MCV: 92.6 fL (ref 81.6–98.0)
MONO#: 0.5 10*3/uL (ref 0.1–0.9)
MONO%: 10.3 % (ref 0.0–13.0)
NEUT#: 3.1 10*3/uL (ref 1.5–6.5)
NEUT%: 64.7 % (ref 40.0–75.0)
Platelets: 189 10*3/uL (ref 145–400)
RBC: 3.99 10*6/uL — ABNORMAL LOW (ref 4.20–5.71)
RDW: 12.8 % (ref 11.2–14.6)
WBC: 4.7 10*3/uL (ref 4.0–10.0)
lymph#: 1.1 10*3/uL (ref 0.9–3.3)

## 2008-05-17 LAB — IMMUNOFIXATION ELECTROPHORESIS
IgA: 161 mg/dL (ref 68–378)
IgG (Immunoglobin G), Serum: 1050 mg/dL (ref 694–1618)
IgM, Serum: 125 mg/dL (ref 60–263)
Total Protein, Serum Electrophoresis: 6.7 g/dL (ref 6.0–8.3)

## 2008-05-17 LAB — KAPPA/LAMBDA LIGHT CHAINS
Kappa free light chain: 1.56 mg/dL (ref 0.33–1.94)
Kappa:Lambda Ratio: 1.47 (ref 0.26–1.65)
Lambda Free Lght Chn: 1.06 mg/dL (ref 0.57–2.63)

## 2008-05-17 LAB — COMPREHENSIVE METABOLIC PANEL
ALT: 9 U/L (ref 0–53)
AST: 12 U/L (ref 0–37)
Albumin: 4.3 g/dL (ref 3.5–5.2)
Alkaline Phosphatase: 77 U/L (ref 39–117)
BUN: 16 mg/dL (ref 6–23)
CO2: 26 mEq/L (ref 19–32)
Calcium: 9.4 mg/dL (ref 8.4–10.5)
Chloride: 101 mEq/L (ref 96–112)
Creatinine, Ser: 1.15 mg/dL (ref 0.40–1.50)
Glucose, Bld: 84 mg/dL (ref 70–99)
Potassium: 4.2 mEq/L (ref 3.5–5.3)
Sodium: 137 mEq/L (ref 135–145)
Total Bilirubin: 0.6 mg/dL (ref 0.3–1.2)
Total Protein: 6.7 g/dL (ref 6.0–8.3)

## 2008-05-17 LAB — LACTATE DEHYDROGENASE: LDH: 118 U/L (ref 94–250)

## 2008-08-23 ENCOUNTER — Ambulatory Visit: Payer: Self-pay | Admitting: Internal Medicine

## 2008-09-06 ENCOUNTER — Telehealth (INDEPENDENT_AMBULATORY_CARE_PROVIDER_SITE_OTHER): Payer: Self-pay | Admitting: *Deleted

## 2008-09-07 ENCOUNTER — Encounter: Payer: Self-pay | Admitting: Cardiovascular Disease

## 2008-09-07 ENCOUNTER — Ambulatory Visit: Payer: Self-pay

## 2008-09-27 ENCOUNTER — Telehealth: Payer: Self-pay | Admitting: Internal Medicine

## 2008-10-02 DIAGNOSIS — I10 Essential (primary) hypertension: Secondary | ICD-10-CM | POA: Insufficient documentation

## 2008-10-02 DIAGNOSIS — E785 Hyperlipidemia, unspecified: Secondary | ICD-10-CM | POA: Insufficient documentation

## 2008-10-14 ENCOUNTER — Telehealth: Payer: Self-pay | Admitting: Internal Medicine

## 2008-11-09 ENCOUNTER — Ambulatory Visit: Payer: Self-pay | Admitting: Oncology

## 2008-11-11 LAB — CBC WITH DIFFERENTIAL/PLATELET
BASO%: 0.5 % (ref 0.0–2.0)
Basophils Absolute: 0 10*3/uL (ref 0.0–0.1)
EOS%: 1.3 % (ref 0.0–7.0)
Eosinophils Absolute: 0.1 10*3/uL (ref 0.0–0.5)
HCT: 35.6 % — ABNORMAL LOW (ref 38.4–49.9)
HGB: 12.4 g/dL — ABNORMAL LOW (ref 13.0–17.1)
LYMPH%: 16.4 % (ref 14.0–49.0)
MCH: 32.5 pg (ref 27.2–33.4)
MCHC: 35 g/dL (ref 32.0–36.0)
MCV: 92.9 fL (ref 79.3–98.0)
MONO#: 0.5 10*3/uL (ref 0.1–0.9)
MONO%: 9.3 % (ref 0.0–14.0)
NEUT#: 3.6 10*3/uL (ref 1.5–6.5)
NEUT%: 72.5 % (ref 39.0–75.0)
Platelets: 181 10*3/uL (ref 140–400)
RBC: 3.83 10*6/uL — ABNORMAL LOW (ref 4.20–5.82)
RDW: 12.6 % (ref 11.0–14.6)
WBC: 4.9 10*3/uL (ref 4.0–10.3)
lymph#: 0.8 10*3/uL — ABNORMAL LOW (ref 0.9–3.3)

## 2008-11-12 LAB — COMPREHENSIVE METABOLIC PANEL
ALT: 11 U/L (ref 0–53)
AST: 14 U/L (ref 0–37)
Albumin: 4.1 g/dL (ref 3.5–5.2)
Alkaline Phosphatase: 83 U/L (ref 39–117)
BUN: 16 mg/dL (ref 6–23)
CO2: 26 mEq/L (ref 19–32)
Calcium: 8.9 mg/dL (ref 8.4–10.5)
Chloride: 104 mEq/L (ref 96–112)
Creatinine, Ser: 1.02 mg/dL (ref 0.40–1.50)
Glucose, Bld: 93 mg/dL (ref 70–99)
Potassium: 4.1 mEq/L (ref 3.5–5.3)
Sodium: 139 mEq/L (ref 135–145)
Total Bilirubin: 0.7 mg/dL (ref 0.3–1.2)
Total Protein: 6.4 g/dL (ref 6.0–8.3)

## 2008-11-12 LAB — KAPPA/LAMBDA LIGHT CHAINS
Kappa free light chain: 1.55 mg/dL (ref 0.33–1.94)
Kappa:Lambda Ratio: 1.28 (ref 0.26–1.65)
Lambda Free Lght Chn: 1.21 mg/dL (ref 0.57–2.63)

## 2008-11-12 LAB — IGG, IGA, IGM
IgA: 128 mg/dL (ref 68–378)
IgG (Immunoglobin G), Serum: 887 mg/dL (ref 694–1618)
IgM, Serum: 96 mg/dL (ref 60–263)

## 2008-11-12 LAB — LACTATE DEHYDROGENASE: LDH: 117 U/L (ref 94–250)

## 2008-11-18 ENCOUNTER — Encounter: Payer: Self-pay | Admitting: Internal Medicine

## 2008-11-19 ENCOUNTER — Telehealth: Payer: Self-pay | Admitting: Internal Medicine

## 2009-01-18 ENCOUNTER — Ambulatory Visit: Payer: Self-pay | Admitting: Internal Medicine

## 2009-01-18 DIAGNOSIS — I2510 Atherosclerotic heart disease of native coronary artery without angina pectoris: Secondary | ICD-10-CM | POA: Insufficient documentation

## 2009-03-21 ENCOUNTER — Ambulatory Visit: Payer: Self-pay | Admitting: Oncology

## 2009-03-23 LAB — CBC WITH DIFFERENTIAL/PLATELET
BASO%: 0.8 % (ref 0.0–2.0)
Basophils Absolute: 0 10*3/uL (ref 0.0–0.1)
EOS%: 2.1 % (ref 0.0–7.0)
Eosinophils Absolute: 0.1 10*3/uL (ref 0.0–0.5)
HCT: 37.1 % — ABNORMAL LOW (ref 38.4–49.9)
HGB: 12.8 g/dL — ABNORMAL LOW (ref 13.0–17.1)
LYMPH%: 20.4 % (ref 14.0–49.0)
MCH: 32 pg (ref 27.2–33.4)
MCHC: 34.4 g/dL (ref 32.0–36.0)
MCV: 92.8 fL (ref 79.3–98.0)
MONO#: 0.6 10*3/uL (ref 0.1–0.9)
MONO%: 11.8 % (ref 0.0–14.0)
NEUT#: 3.2 10*3/uL (ref 1.5–6.5)
NEUT%: 64.9 % (ref 39.0–75.0)
Platelets: 209 10*3/uL (ref 140–400)
RBC: 3.99 10*6/uL — ABNORMAL LOW (ref 4.20–5.82)
RDW: 12.8 % (ref 11.0–14.6)
WBC: 4.9 10*3/uL (ref 4.0–10.3)
lymph#: 1 10*3/uL (ref 0.9–3.3)

## 2009-03-25 LAB — LACTATE DEHYDROGENASE: LDH: 133 U/L (ref 94–250)

## 2009-03-25 LAB — IMMUNOFIXATION ELECTROPHORESIS
IgA: 136 mg/dL (ref 68–378)
IgG (Immunoglobin G), Serum: 920 mg/dL (ref 694–1618)
IgM, Serum: 96 mg/dL (ref 60–263)
Total Protein, Serum Electrophoresis: 6.5 g/dL (ref 6.0–8.3)

## 2009-03-25 LAB — COMPREHENSIVE METABOLIC PANEL
ALT: 14 U/L (ref 0–53)
AST: 16 U/L (ref 0–37)
Albumin: 4.1 g/dL (ref 3.5–5.2)
Alkaline Phosphatase: 82 U/L (ref 39–117)
BUN: 15 mg/dL (ref 6–23)
CO2: 27 mEq/L (ref 19–32)
Calcium: 8.9 mg/dL (ref 8.4–10.5)
Chloride: 102 mEq/L (ref 96–112)
Creatinine, Ser: 1.07 mg/dL (ref 0.40–1.50)
Glucose, Bld: 89 mg/dL (ref 70–99)
Potassium: 4.3 mEq/L (ref 3.5–5.3)
Sodium: 139 mEq/L (ref 135–145)
Total Bilirubin: 0.6 mg/dL (ref 0.3–1.2)
Total Protein: 6.5 g/dL (ref 6.0–8.3)

## 2009-03-25 LAB — BETA 2 MICROGLOBULIN, SERUM: Beta-2 Microglobulin: 1.68 mg/L (ref 1.01–1.73)

## 2009-03-25 LAB — KAPPA/LAMBDA LIGHT CHAINS
Kappa free light chain: 0.64 mg/dL (ref 0.33–1.94)
Kappa:Lambda Ratio: 0.74 (ref 0.26–1.65)
Lambda Free Lght Chn: 0.87 mg/dL (ref 0.57–2.63)

## 2009-03-31 ENCOUNTER — Encounter: Payer: Self-pay | Admitting: Internal Medicine

## 2009-06-08 ENCOUNTER — Emergency Department (HOSPITAL_COMMUNITY): Admission: EM | Admit: 2009-06-08 | Discharge: 2009-06-08 | Payer: Self-pay | Admitting: Family Medicine

## 2009-07-31 ENCOUNTER — Emergency Department (HOSPITAL_COMMUNITY): Admission: EM | Admit: 2009-07-31 | Discharge: 2009-07-31 | Payer: Self-pay | Admitting: Family Medicine

## 2009-09-20 ENCOUNTER — Ambulatory Visit: Payer: Self-pay | Admitting: Oncology

## 2009-09-22 LAB — CBC WITH DIFFERENTIAL/PLATELET
BASO%: 0.3 % (ref 0.0–2.0)
Basophils Absolute: 0 10*3/uL (ref 0.0–0.1)
EOS%: 0.7 % (ref 0.0–7.0)
Eosinophils Absolute: 0.1 10*3/uL (ref 0.0–0.5)
HCT: 37.8 % — ABNORMAL LOW (ref 38.4–49.9)
HGB: 13 g/dL (ref 13.0–17.1)
LYMPH%: 14 % (ref 14.0–49.0)
MCH: 32.1 pg (ref 27.2–33.4)
MCHC: 34.4 g/dL (ref 32.0–36.0)
MCV: 93.3 fL (ref 79.3–98.0)
MONO#: 0.5 10*3/uL (ref 0.1–0.9)
MONO%: 6.3 % (ref 0.0–14.0)
NEUT#: 6.2 10*3/uL (ref 1.5–6.5)
NEUT%: 78.7 % — ABNORMAL HIGH (ref 39.0–75.0)
Platelets: 224 10*3/uL (ref 140–400)
RBC: 4.05 10*6/uL — ABNORMAL LOW (ref 4.20–5.82)
RDW: 13.4 % (ref 11.0–14.6)
WBC: 7.8 10*3/uL (ref 4.0–10.3)
lymph#: 1.1 10*3/uL (ref 0.9–3.3)

## 2009-09-23 LAB — IGG, IGA, IGM
IgA: 120 mg/dL (ref 68–378)
IgG (Immunoglobin G), Serum: 762 mg/dL (ref 694–1618)
IgM, Serum: 93 mg/dL (ref 60–263)

## 2009-09-23 LAB — COMPREHENSIVE METABOLIC PANEL
ALT: 10 U/L (ref 0–53)
AST: 14 U/L (ref 0–37)
Albumin: 4.6 g/dL (ref 3.5–5.2)
Alkaline Phosphatase: 82 U/L (ref 39–117)
BUN: 14 mg/dL (ref 6–23)
CO2: 26 mEq/L (ref 19–32)
Calcium: 8.9 mg/dL (ref 8.4–10.5)
Chloride: 103 mEq/L (ref 96–112)
Creatinine, Ser: 0.92 mg/dL (ref 0.40–1.50)
Glucose, Bld: 86 mg/dL (ref 70–99)
Potassium: 3.9 mEq/L (ref 3.5–5.3)
Sodium: 139 mEq/L (ref 135–145)
Total Bilirubin: 0.5 mg/dL (ref 0.3–1.2)
Total Protein: 6.4 g/dL (ref 6.0–8.3)

## 2009-09-23 LAB — BETA 2 MICROGLOBULIN, SERUM: Beta-2 Microglobulin: 1.41 mg/L (ref 1.01–1.73)

## 2009-09-29 ENCOUNTER — Encounter: Payer: Self-pay | Admitting: Internal Medicine

## 2009-10-26 ENCOUNTER — Encounter: Payer: Self-pay | Admitting: Internal Medicine

## 2009-10-26 LAB — CONVERTED CEMR LAB
HCT: 39 %
Hemoglobin: 13.3 g/dL
MCV: 91.8 fL
Platelets: 260 10*3/uL
WBC: 6.5 10*3/uL

## 2009-11-03 ENCOUNTER — Encounter: Payer: Self-pay | Admitting: Internal Medicine

## 2009-11-04 ENCOUNTER — Telehealth: Payer: Self-pay | Admitting: Internal Medicine

## 2009-11-07 ENCOUNTER — Telehealth (INDEPENDENT_AMBULATORY_CARE_PROVIDER_SITE_OTHER): Payer: Self-pay | Admitting: *Deleted

## 2009-12-09 ENCOUNTER — Ambulatory Visit (HOSPITAL_COMMUNITY): Admission: RE | Admit: 2009-12-09 | Discharge: 2009-12-09 | Payer: Self-pay | Admitting: Neurological Surgery

## 2009-12-14 ENCOUNTER — Telehealth: Payer: Self-pay | Admitting: Internal Medicine

## 2009-12-16 ENCOUNTER — Encounter: Payer: Self-pay | Admitting: Internal Medicine

## 2009-12-22 ENCOUNTER — Ambulatory Visit: Payer: Self-pay | Admitting: Internal Medicine

## 2010-01-17 ENCOUNTER — Telehealth (INDEPENDENT_AMBULATORY_CARE_PROVIDER_SITE_OTHER): Payer: Self-pay | Admitting: *Deleted

## 2010-01-17 ENCOUNTER — Telehealth: Payer: Self-pay | Admitting: Internal Medicine

## 2010-01-18 ENCOUNTER — Ambulatory Visit: Payer: Self-pay | Admitting: Internal Medicine

## 2010-01-18 DIAGNOSIS — R195 Other fecal abnormalities: Secondary | ICD-10-CM | POA: Insufficient documentation

## 2010-01-19 ENCOUNTER — Encounter: Payer: Self-pay | Admitting: Internal Medicine

## 2010-02-07 ENCOUNTER — Ambulatory Visit (HOSPITAL_COMMUNITY): Admission: RE | Admit: 2010-02-07 | Discharge: 2010-02-07 | Payer: Self-pay | Admitting: Neurological Surgery

## 2010-02-23 ENCOUNTER — Telehealth: Payer: Self-pay | Admitting: Internal Medicine

## 2010-02-27 ENCOUNTER — Encounter (INDEPENDENT_AMBULATORY_CARE_PROVIDER_SITE_OTHER): Payer: Self-pay | Admitting: *Deleted

## 2010-02-27 DIAGNOSIS — R1319 Other dysphagia: Secondary | ICD-10-CM | POA: Insufficient documentation

## 2010-03-02 ENCOUNTER — Encounter (HOSPITAL_COMMUNITY): Admission: RE | Admit: 2010-03-02 | Discharge: 2010-04-20 | Payer: Self-pay | Admitting: Internal Medicine

## 2010-03-17 ENCOUNTER — Telehealth: Payer: Self-pay | Admitting: Internal Medicine

## 2010-03-21 ENCOUNTER — Ambulatory Visit: Payer: Self-pay | Admitting: Oncology

## 2010-03-21 ENCOUNTER — Ambulatory Visit (HOSPITAL_COMMUNITY): Admission: RE | Admit: 2010-03-21 | Discharge: 2010-03-21 | Payer: Self-pay | Admitting: Internal Medicine

## 2010-03-21 LAB — CBC WITH DIFFERENTIAL/PLATELET
BASO%: 0.4 % (ref 0.0–2.0)
Basophils Absolute: 0 10*3/uL (ref 0.0–0.1)
EOS%: 1.5 % (ref 0.0–7.0)
Eosinophils Absolute: 0.1 10*3/uL (ref 0.0–0.5)
HCT: 37.7 % — ABNORMAL LOW (ref 38.4–49.9)
HGB: 12.7 g/dL — ABNORMAL LOW (ref 13.0–17.1)
LYMPH%: 22.3 % (ref 14.0–49.0)
MCH: 30.6 pg (ref 27.2–33.4)
MCHC: 33.7 g/dL (ref 32.0–36.0)
MCV: 90.8 fL (ref 79.3–98.0)
MONO#: 0.4 10*3/uL (ref 0.1–0.9)
MONO%: 8 % (ref 0.0–14.0)
NEUT#: 3.6 10*3/uL (ref 1.5–6.5)
NEUT%: 67.8 % (ref 39.0–75.0)
Platelets: 185 10*3/uL (ref 140–400)
RBC: 4.15 10*6/uL — ABNORMAL LOW (ref 4.20–5.82)
RDW: 13.1 % (ref 11.0–14.6)
WBC: 5.4 10*3/uL (ref 4.0–10.3)
lymph#: 1.2 10*3/uL (ref 0.9–3.3)

## 2010-03-22 LAB — COMPREHENSIVE METABOLIC PANEL
ALT: 9 U/L (ref 0–53)
AST: 16 U/L (ref 0–37)
Albumin: 4.4 g/dL (ref 3.5–5.2)
Alkaline Phosphatase: 103 U/L (ref 39–117)
BUN: 10 mg/dL (ref 6–23)
CO2: 32 mEq/L (ref 19–32)
Calcium: 9.9 mg/dL (ref 8.4–10.5)
Chloride: 97 mEq/L (ref 96–112)
Creatinine, Ser: 1.04 mg/dL (ref 0.40–1.50)
Glucose, Bld: 82 mg/dL (ref 70–99)
Potassium: 4.1 mEq/L (ref 3.5–5.3)
Sodium: 138 mEq/L (ref 135–145)
Total Bilirubin: 0.7 mg/dL (ref 0.3–1.2)
Total Protein: 6.7 g/dL (ref 6.0–8.3)

## 2010-03-22 LAB — LACTATE DEHYDROGENASE: LDH: 128 U/L (ref 94–250)

## 2010-03-22 LAB — BETA 2 MICROGLOBULIN, SERUM: Beta-2 Microglobulin: 1.76 mg/L — ABNORMAL HIGH (ref 1.01–1.73)

## 2010-03-23 ENCOUNTER — Telehealth: Payer: Self-pay | Admitting: Internal Medicine

## 2010-03-27 ENCOUNTER — Encounter (INDEPENDENT_AMBULATORY_CARE_PROVIDER_SITE_OTHER): Payer: Self-pay

## 2010-03-28 ENCOUNTER — Ambulatory Visit: Payer: Self-pay | Admitting: Internal Medicine

## 2010-04-04 ENCOUNTER — Ambulatory Visit (HOSPITAL_COMMUNITY): Admission: RE | Admit: 2010-04-04 | Discharge: 2010-04-04 | Payer: Self-pay | Admitting: Internal Medicine

## 2010-04-04 ENCOUNTER — Ambulatory Visit: Payer: Self-pay | Admitting: Internal Medicine

## 2010-04-04 DIAGNOSIS — Z860100 Personal history of colon polyps, unspecified: Secondary | ICD-10-CM

## 2010-04-04 HISTORY — DX: Personal history of colonic polyps: Z86.010

## 2010-04-04 HISTORY — DX: Personal history of colon polyps, unspecified: Z86.0100

## 2010-04-10 ENCOUNTER — Encounter: Payer: Self-pay | Admitting: Internal Medicine

## 2010-05-15 ENCOUNTER — Ambulatory Visit (HOSPITAL_COMMUNITY)
Admission: RE | Admit: 2010-05-15 | Discharge: 2010-05-15 | Payer: Self-pay | Source: Home / Self Care | Admitting: Oncology

## 2010-06-14 ENCOUNTER — Ambulatory Visit: Payer: Self-pay | Admitting: Oncology

## 2010-06-20 LAB — COMPREHENSIVE METABOLIC PANEL
ALT: 14 U/L (ref 0–53)
AST: 16 U/L (ref 0–37)
Albumin: 4 g/dL (ref 3.5–5.2)
Alkaline Phosphatase: 87 U/L (ref 39–117)
BUN: 13 mg/dL (ref 6–23)
CO2: 32 mEq/L (ref 19–32)
Calcium: 9.3 mg/dL (ref 8.4–10.5)
Chloride: 103 mEq/L (ref 96–112)
Creatinine, Ser: 0.97 mg/dL (ref 0.40–1.50)
Glucose, Bld: 91 mg/dL (ref 70–99)
Potassium: 4.3 mEq/L (ref 3.5–5.3)
Sodium: 140 mEq/L (ref 135–145)
Total Bilirubin: 0.7 mg/dL (ref 0.3–1.2)
Total Protein: 7.1 g/dL (ref 6.0–8.3)

## 2010-06-20 LAB — CBC WITH DIFFERENTIAL/PLATELET
BASO%: 1.3 % (ref 0.0–2.0)
Basophils Absolute: 0.1 10*3/uL (ref 0.0–0.1)
EOS%: 1.8 % (ref 0.0–7.0)
Eosinophils Absolute: 0.1 10*3/uL (ref 0.0–0.5)
HCT: 39.5 % (ref 38.4–49.9)
HGB: 14 g/dL (ref 13.0–17.1)
LYMPH%: 17.7 % (ref 14.0–49.0)
MCH: 32.5 pg (ref 27.2–33.4)
MCHC: 35.5 g/dL (ref 32.0–36.0)
MCV: 91.5 fL (ref 79.3–98.0)
MONO#: 0.5 10*3/uL (ref 0.1–0.9)
MONO%: 7.6 % (ref 0.0–14.0)
NEUT#: 4.5 10*3/uL (ref 1.5–6.5)
NEUT%: 71.6 % (ref 39.0–75.0)
Platelets: 220 10*3/uL (ref 140–400)
RBC: 4.32 10*6/uL (ref 4.20–5.82)
RDW: 13.4 % (ref 11.0–14.6)
WBC: 6.3 10*3/uL (ref 4.0–10.3)
lymph#: 1.1 10*3/uL (ref 0.9–3.3)

## 2010-06-20 LAB — LACTATE DEHYDROGENASE: LDH: 114 U/L (ref 94–250)

## 2010-06-21 LAB — IGM: IgM, Serum: 111 mg/dL (ref 60–263)

## 2010-06-21 LAB — BETA 2 MICROGLOBULIN, SERUM: Beta-2 Microglobulin: 1.34 mg/L (ref 1.01–1.73)

## 2010-06-27 ENCOUNTER — Encounter: Payer: Self-pay | Admitting: Internal Medicine

## 2010-07-08 ENCOUNTER — Encounter: Payer: Self-pay | Admitting: Internal Medicine

## 2010-07-18 NOTE — Letter (Signed)
[facility]   Imported By: Roderic Ovens 12/13/2009 14:07:51  _____________________________________________________________________  External Attachment:    Type:   Image     Comment:   External Document

## 2010-07-18 NOTE — Letter (Signed)
Summary: [facility]  [facility]   Imported By: Lester Garrett 05/12/2010 09:06:45  _____________________________________________________________________  External Attachment:    Type:   Image     Comment:   External Document

## 2010-07-18 NOTE — Progress Notes (Signed)
Summary:  Endo/Colon & BA Swallow Scheduled  Phone Note Call from Patient Call back at [phone]    [phone]   Caller: wife-Brenda Call For: Dr. Leone Payor Reason for Call: Talk to Nurse Summary of Call: wife called to sch COL.Marland Kitchen. however, pt was seen recently for blood in stool- is pt cleared for a COL??... also, wife states that pt is having more difficulty swallowing since ov and indigestion... wanting to knowif pt could have ECL instead of just COL  Initial call taken by: Vallarie Mare,  February 23, 2010 9:00 AM  Follow-up for Phone Call        Message left for patient's wife to callback. Laureen Ochs LPN  February 23, 2010 12:24 PM   Last OV 01-18-10, Per pt. wife, she wants pt. to have an Endoscopy also, for c/o dysphagia.   DR.GESSNER PLEASE ADVISE  Follow-up by: Laureen Ochs LPN,  February 23, 2010 1:09 PM  Additional Follow-up for Phone Call Additional follow up Details #1::        as long as c-spine surgery done and surgeon ok for him to have colonoscopy its ok. if he is having trouble swallowing food after c-spine surgery that could be temporary...Marland Kitchenor not i know he had trouble swallowing pills only and was not inclined to scope for that 1) ok for colonoscopy if done with c-spine surgery and cleared as avbove 2) get more hx re: dysphagia and if having trouble swallowing food have hime get a Ba swallow w/tablet - if only large pills as before then would not do anything but cut pills etc Additional Follow-up by: Iva Boop MD, Clementeen Graham,  February 23, 2010 4:17 PM  New Problems: DYSPHAGIA (ICD-787.29)   Additional Follow-up for Phone Call Additional follow up Details #2::    Above MD orders reviewed with Mrs.Domagalski, she states the dysphagia is worse since surgery but  pt. had increased bloating/belching, before surgery, "He eats Tums like candy"  She would rather he have an Endoscopy than a Ba Swallow.  She states his surgeon told them he could have the Colonoscopy 6 weeks  after c-spine surgery, so she wants to go ahead and get it scheduled, but she really wants an Endoscopy done, also.   DR.GESSNER PLEASE ADVISE  Follow-up by: Laureen Ochs LPN,  February 23, 2010 4:25 PM  Additional Follow-up for Phone Call Additional follow up Details #3:: Details for Additional Follow-up Action Taken: ok to set up both but I think he should have a bariun swallow with tablet re: dysphagia also - will provide useful info and can be done anytime now Iva Boop MD, [facility]  February 24, 2010 9:57 AM  Message left for Mrs.Vallone to callback. Laureen Ochs LPN  February 24, 2010 10:11 AM  Message left for patient to callback. Laureen Ochs LPN  February 24, 2010 3:38 PM  Above MD orders reviewed with patient's wife.  SHe scheduled pt's Ba Swallow w/tablet on 03-30-10 at 9:30am at [facility], NPO 3 hours prior. She scheduled his previsit for 03-28-10 at 8:30am and his Endo/dil, Colon at [facility] on 04-04-10 at 9:15am. Pt. instructed to call back as needed. Additional Follow-up by: Laureen Ochs LPN,  February 27, 2010 12:20 PM  New Problems: DYSPHAGIA [phone])

## 2010-07-18 NOTE — Progress Notes (Signed)
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 Patient would like a copy mailed to pt or fax [phone] attnSteward Drone  Phone [phone] ext [phone] Delmar Landau Initial call taken by: Burnard Leigh,  September 27, 2008 9:43 AM  Follow-up for Phone Call        needs copy of dictation for his records also.  Will put with records and will be ready to pick up tomorrow 09/28/08. Pt aware Follow-up by: Dennis Bast, RN, BSN,  September 27, 2008 10:03 AM    New/Updated Medications: TOPROL XL 50 MG XR24H-TAB (METOPROLOL SUCCINATE)

## 2010-07-18 NOTE — Progress Notes (Signed)
Summary: letter for handicap sticker  Phone Note Call from Patient Call back at [phone]    Caller: Spouse  Willie Garcia Summary of Call: Patient would like a letter so he can apply for a handicap sticker for his car.  Dr. Ladona Ridgel does not feel he is sick enough at this point to need a handicap sticker. Will call pt and let them know. LMOM for patient to call me back if he wishes to discuss this further. Dennis Bast, RN, BSN  October 15, 2008 11:33 AM  Initial call taken by: Burnard Leigh,  October 14, 2008 3:44 PM  Follow-up for Phone Call        Will have one done if Dr. Ladona Ridgel says okay.  Will ask on 10/15/08 Dennis Bast, RN, BSN  October 14, 2008 5:59 PM

## 2010-07-18 NOTE — Letter (Signed)
Summary: [facility]  [facility]   Imported By: Marylou Mccoy 02/08/2010 15:04:28  _____________________________________________________________________  External Attachment:    Type:   Image     Comment:   External Document

## 2010-07-18 NOTE — Progress Notes (Signed)
  Walk in Patient Form Recieved " Pt needs paperwork for Physical please call" sent to [facility]  January 17, 2010 8:18 AM

## 2010-07-18 NOTE — Letter (Signed)
Summary: [facility]  [facility]   Imported By: Kassie Mends 06/09/2009 08:45:58  _____________________________________________________________________  External Attachment:    Type:   Image     Comment:   External Document

## 2010-07-18 NOTE — Letter (Signed)
Summary: [facility] Instructions  [facility]  [address] [place], [place] [address]   Phone: [phone]  Fax: [phone]       Willie Garcia    [birth year]    MRN: [id number]        Procedure Day /Date: Tuesday 04-04-10     Arrival Time: 8:15 a.m.     Procedure Time: 9:15 a.m.     Location of Procedure:                      [facility] ( Outpatient Registration)                        PREPARATION FOR COLONOSCOPY WITH MOVIPREP   Starting 5 days prior to your procedure  03-30-10 do not eat nuts, seeds, popcorn, corn, beans, peas,  salads, or any raw vegetables.  Do not take any fiber supplements (e.g. Metamucil, Citrucel, and Benefiber).  THE DAY BEFORE YOUR PROCEDURE         DATE: 04-03-10   DAY:  Monday  1.  Drink clear liquids the entire day-NO SOLID FOOD  2.  Do not drink anything colored red or purple.  Avoid juices with pulp.  No orange juice.  3.  Drink at least 64 oz. (8 glasses) of fluid/clear liquids during the day to prevent dehydration and help the prep work efficiently.  CLEAR LIQUIDS INCLUDE: Water Jello Ice Popsicles Tea (sugar ok, no milk/cream) Powdered fruit flavored drinks Coffee (sugar ok, no milk/cream) Gatorade Juice: apple, white grape, white cranberry  Lemonade Clear bullion, consomm, broth Carbonated beverages (any kind) Strained chicken noodle soup Hard Candy                             4.  In the morning, mix first dose of MoviPrep solution:    Empty 1 Pouch A and 1 Pouch B into the disposable container    Add lukewarm drinking water to the top line of the container. Mix to dissolve    Refrigerate (mixed solution should be used within 24 hrs)  5.  Begin drinking the prep at 5:00 p.m. The MoviPrep container is divided by 4 marks.   Every 15 minutes drink the solution down to the next mark (approximately 8 oz) until the full liter is complete.   6.  Follow completed prep with 16 oz of clear liquid of your  choice (Nothing red or purple).  Continue to drink clear liquids until bedtime.  7.  Before going to bed, mix second dose of MoviPrep solution:    Empty 1 Pouch A and 1 Pouch B into the disposable container    Add lukewarm drinking water to the top line of the container. Mix to dissolve    Refrigerate  THE DAY OF YOUR PROCEDURE      DATE:  04-04-10 DAY: Tuesday  Beginning at  4:15 a.m. (5 hours before procedure):         1. Every 15 minutes, drink the solution down to the next mark (approx 8 oz) until the full liter is complete.  2. Follow completed prep with 16 oz. of clear liquid of your choice.    3. You may drink clear liquids until  5:15 a.m.  (4 HOURS BEFORE PROCEDURE).   MEDICATION INSTRUCTIONS  Unless otherwise instructed, you should take regular prescription medications with a small sip of water  as early as possible the morning of your procedure.         OTHER INSTRUCTIONS  You will need a responsible adult at least 65 years of age to accompany you and drive you home.   This person must remain in the waiting room during your procedure.  Wear loose fitting clothing that is easily removed.  Leave jewelry and other valuables at home.  However, you may wish to bring a book to read or  an iPod/MP3 player to listen to music as you wait for your procedure to start.  Remove all body piercing jewelry and leave at home.  Total time from sign-in until discharge is approximately 2-3 hours.  You should go home directly after your procedure and rest.  You can resume normal activities the  day after your procedure.  The day of your procedure you should not:   Drive   Make legal decisions   Operate machinery   Drink alcohol   Return to work  You will receive specific instructions about eating, activities and medications before you leave.    The above instructions have been reviewed and explained to me by   Ulis Rias RN  March 28, 2010 9:49 AM     I  fully understand and can verbalize these instructions _____________________________ Date _________

## 2010-07-18 NOTE — Procedures (Signed)
Summary: Colonoscopy  Patient: [name] Note: All result statuses are Final unless otherwise noted.  Tests: (1) Colonoscopy (COL)   COL Colonoscopy           DONE     [facility]     [address] [place], [place]  [address]           COLONOSCOPY PROCEDURE REPORT           PATIENT:  Willie Garcia, Willie Garcia  MR#:  [id number]     BIRTHDATE:  Oct 22, 1945, 64 yrs. old  GENDER:  male     ENDOSCOPIST:  Iva Boop, MD, [facility]           PROCEDURE DATE:  04/04/2010     PROCEDURE:  Colonoscopy with snare polypectomy     ASA CLASS:  Class II     INDICATIONS:  heme positive stool iFOBT +     MEDICATIONS:   There was residual sedation effect present from     prior procedure., Fentanyl 25 mcg IV, Versed 3 mg           DESCRIPTION OF PROCEDURE:   After the risks benefits and     alternatives of the procedure were thoroughly explained, informed     consent was obtained.  Digital rectal exam was performed and     revealed no abnormalities and normal prostate.   The Pentax     Colonoscope [id number] endoscope was introduced through the anus and     advanced to the cecum, which was identified by both the appendix     and ileocecal valve, without limitations.  The quality of the prep     was good, using MoviPrep.  The instrument was then slowly     withdrawn as the colon was fully examined.     Insertion: 10 minutes Withdrawal: 12 minutes     <<PROCEDUREIMAGES>>           FINDINGS:  Three polyps were found. They were diminutive. Cecum,     transverse and descending polyps. Polyps were snared without     cautery. Retrieval was successful. snare polyp  This was otherwise     a normal examination of the colon.   Retroflexed views in the     rectum revealed internal hemorrhoids.    The scope was then     withdrawn from the patient and the procedure completed.           COMPLICATIONS:  None     ENDOSCOPIC IMPRESSION:     1) Three diminutive polyps removed     2) Internal hemorrhoids  3) Otherwise normal examination, good prep           RECOMMENDATIONS: he will be in a colonoscopy surveillance and     screening program so routine hemoccults will not be needed           REPEAT EXAM:  In for Colonoscopy, pending biopsy results.           Iva Boop, MD, Clementeen Graham           CC:  [name], MD     The Patient           n.     Rosalie Doctor:   Iva Boop at 04/04/2010 10:33 AM           [place], [phone]  Note: An exclamation mark (!) indicates a result that was not dispersed into the flowsheet.  Document Creation Date: 04/04/2010 10:33 AM _______________________________________________________________________  (1) Order result status: Final Collection or observation date-time: 04/04/2010 10:22 Requested date-time:  Receipt date-time:  Reported date-time:  Referring Physician:   Ordering Physician: [name] [id number]) Specimen Source:  Source: Launa Grill Order Number: [id number] Lab site:   Appended Document: Colonoscopy   Colonoscopy  Procedure date:  04/04/2010  Findings:         1) Three diminutive polyps removed ADENOMAS     2) Internal hemorrhoids     3) Otherwise normal examination, good prep  Comments:      Repeat colonoscopy in 5 years.   Procedures Next Due Date:    Colonoscopy: 04/2015   Appended Document: Colonoscopy recall entered in IDX 03/2015

## 2010-07-18 NOTE — Progress Notes (Signed)
Summary: needs to talk w/you today re a situation he is having  Phone Note Call from Patient   Caller: Patient [phone] Reason for Call: Talk to Nurse Summary of Call: pt needs to talk with you re "a situation that's going on" he will explain when you call-would like a call back today- [phone] Initial call taken by: Glynda Jaeger,  December 14, 2009 3:02 PM  Follow-up for Phone Call        12/22/2009 at 3:15 with r Russ Halo, RN, BSN  December 14, 2009 3:45 PM

## 2010-07-18 NOTE — Letter (Signed)
Summary: [facility]  [facility]   Imported By: Sherian Rein 01/25/2010 07:14:30  _____________________________________________________________________  External Attachment:    Type:   Image     Comment:   External Document

## 2010-07-18 NOTE — Assessment & Plan Note (Signed)
Summary: rov-needs letter for job   Visit Type:  Follow-up   History of Present Illness: Mr. reiland returns today for followup to discuss multiple issues.  He is being considered for cervical spine surgery by Dr. Danielle Dess.  He needs clearance.  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  The patient denies c/p, sob, or peripheral edema.  No syncope or near syncope.  His energy level appears to be stable.  He was noted to have an abnormal CXR and is undergoing evaluation.  No fever/chills/ or productive cough.  Current Medications (verified): 1)  Toprol Xl 50 Mg Xr24h-Tab (Metoprolol Succinate) .... Take One Tablet By Mouth Once Daily 2)  Crestor 10 Mg Tabs (Rosuvastatin Calcium) .... Take One Tablet By Mouth Daily. 3)  Aspirin 325 Mg  Tabs (Aspirin) .... Daily  Allergies (verified): No Known Drug Allergies  Past History:  Past Medical History: Last updated: 10/02/2008 DYSLIPIDEMIA (ICD-272.4) HYPERTENSION (ICD-401.9)    Past Surgical History: Last updated: 10/02/2008 CABG Rhinoplasy inguinal hernia repair  Review of Systems  The patient denies chest pain, syncope, dyspnea on exertion, and peripheral edema.    Vital Signs:  Patient profile:   65 year old male Height:      70 inches Weight:      154 pounds BMI:     22.18 Pulse rate:   78 / minute BP sitting:   136 / 82  (left arm)  Vitals Entered By: Laurance Flatten CMA (December 22, 2009 3:15 PM)  Physical Exam  General:  Well developed, well nourished, in no acute distress. Head:  normocephalic and atraumatic Eyes:  PERRLA/EOM intact; conjunctiva and lids normal. Mouth:  Teeth, gums and palate normal. Oral mucosa normal. Neck:  Neck supple, no JVD. No masses, thyromegaly or abnormal cervical nodes. Chest Wall:  Well healed median sternotomy incision. Lungs:  Clear bilaterally to auscultation with no wheezes, rales, or rhonchi. Heart:  RRR with normal S1 and S2.  PMI  is not enlarged or laterally displaced. Abdomen:  Bowel sounds positive; abdomen soft and non-tender without masses, organomegaly, or hernias noted. No hepatosplenomegaly. Msk:  Back normal, normal gait. Muscle strength and tone normal. Pulses:  pulses normal in all 4 extremities Extremities:  No clubbing or cyanosis. Neurologic:  Alert and oriented x 3.   Impression & Recommendations:  Problem # 1:  PREOPERATIVE EXAMINATION (ICD-V72.84) The patient is pending cervical spine surgery.  I have discussed the risks with him.  His cardiovascular risk is low and I have encouraged him to proceed if his surgeon thinks that he can help improve his symptoms.  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  He will continue his meds as below. His updated medication list for this problem includes:    Toprol Xl 50 Mg Xr24h-tab (Metoprolol succinate) .Marland Kitchen... Take one tablet by mouth once daily    Aspirin 325 Mg Tabs (Aspirin) .Marland Kitchen... Daily  Problem # 3:  DYSLIPIDEMIA (ICD-272.4) He will continue his Crestor.  Will followup fasting lipids on followup in 6 months. His updated medication list for this problem includes:    Crestor 10 Mg Tabs (Rosuvastatin calcium) .Marland Kitchen... Take one tablet by mouth daily.  Problem # 4:  HYPERTENSION (ICD-401.9) A low sodium diet is recommended along with his current meds. His updated medication list for this problem includes:    Toprol Xl 50 Mg  Xr24h-tab (Metoprolol succinate) .Marland Kitchen... Take one tablet by mouth once daily    Aspirin 325 Mg Tabs (Aspirin) .Marland Kitchen... Daily  Patient Instructions: 1)  Your physician wants you to follow-up in:6 months with Dr Ladona Ridgel.   You will receive a reminder letter in the mail two months in advance. If you don't receive a letter, please call our office to schedule the follow-up appointment.

## 2010-07-18 NOTE — Assessment & Plan Note (Signed)
Summary: Nuclear Study  Nuclear Med Background History: COPD, Echo, Heart Catheterization, Myocardial Infarction  Nuclear Pre-Procedure Cardiac Risk Factors: Family History - CAD, Hypertension, Lipids Caffeine/Decaff Intake: none NPO After: 8:30 PM IV 0.9% NS with Angio Cath: 22g     IV Site: (R) IV Started by: Irean Hong RN Chest Size (in) 42     Height (in): 70 Weight (lb): 156 BMI: 22.46  Nuclear Med Study 1 or 2 day study:  1 day     Stress Test Type:  Stress Reading MD:  Charlton Haws, MD     Referring MD:  G. Taylor Resting Radionuclide:  Technetium 2m Tetrofosmin     Resting Radionuclide Dose:  9.0 mCi  Stress Radionuclide:  Technetium 42m Tetrofosmin     Stress Radionuclide Dose:  31.9 mCi   Stress Protocol Exercise Time (min):  14:22 min     Max HR:  155 bpm     Predicted Max HR: 157 bpm  Max Systolic BP: 170 mm Hg     % Max HR:  99 %     METS: 17.2 Rate Pressure Product:  16109    Stress Test Technologist:  Milana Na EMT-P     Nuclear Technologist:  Domenic Polite CNMT  Rest Procedure  Myocardial perfusion imaging was performed at rest 45 minutes following the intraveneous administration of Myoview Technetium 92m Tetrofosmin.  Stress Procedure  The patient exercised for 14:22.  The patient stopped due to fatigue  and denied any chest pain.  There were non specific ST-T wave changes, and rare pvcs.  Myoview was injected at peak exercise and myocardial perfusion imaging was performed after a brief delay.  QPS Raw Data Images:  Normal; no motion artifact; normal heart/lung ratio. Stress Images:  There is normal uptake in all areas. Rest Images:  Normal homogeneous uptake in all areas of the myocardium. Subtraction (SDS):  Normal Transient Ischemic Dilatation:  .96  (Normal <1.22)  Lung/Heart Ratio:  .29  (Normal <0.45)  Quantitative Gated Spect Images QGS EDV:  92 ml QGS ESV:  31 ml QGS EF:  66 % QGS cine images:  Normal  Findings Normal nuclear  study     Overall Impression Exercise Capacity: Excellent exercise capacity. BP Response: Normal blood pressure response. Clinical Symptoms: No chest pain ECG Impression: No significant ST segment change suggestive of ischemia. Overall Impression: Normal stress nuclear study. Overall Impression Comments: Normal  Appended Document: Nuclear Study Review of Willie Garcia records demonstrate that he was send to have COPD.  This was typed inadvertently.  Careful review of his records demonstrates no evidence of COPD.

## 2010-07-18 NOTE — Miscellaneous (Signed)
Summary: Lec previsit  Clinical Lists Changes  Medications: Added new medication of MOVIPREP 100 GM  SOLR (PEG-KCL-NACL-NASULF-NA ASC-C) As per prep instructions. - Signed Rx of MOVIPREP 100 GM  SOLR (PEG-KCL-NACL-NASULF-NA ASC-C) As per prep instructions.;  #1 x 0;  Signed;  Entered by: Ulis Rias RN;  Authorized by: Iva Boop MD, [facility];  Method used: Electronically to [facility] [address] [place], [place]  [id number], Ph: [phone], Fax: [phone] Observations: Added new observation of NKA: T (03/28/2010 9:34)    Prescriptions: MOVIPREP 100 GM  SOLR (PEG-KCL-NACL-NASULF-NA ASC-C) As per prep instructions.  #1 x 0   Entered by:   Ulis Rias RN   Authorized by:   Iva Boop MD, [facility]   Signed by:   Ulis Rias RN on 03/28/2010   Method used:   Electronically to        [facility]* (retail)       [address]       [place], [place]  [id number]       Ph: [phone]       Fax: [phone]   RxID:   [id number]

## 2010-07-18 NOTE — Progress Notes (Signed)
Summary: Rescheduled Ba Esophagram  Phone Note Call from Patient Call back at [phone]   Caller: wife, [name] Call For: Dr. Leone Payor Reason for Call: Talk to Nurse Summary of Call: would like to resch barium swallow Initial call taken by: Vallarie Mare,  March 17, 2010 2:43 PM  Follow-up for Phone Call        Pt's wife wants to move Ba Esophagram, she will call [facility] to get appt. rescheduled. Laureen Ochs LPN  March 17, 2010 3:13 PM   Ba Esophagram rescheduled to 03-21-10 at 11am at [facility].  Follow-up by: Laureen Ochs LPN,  March 17, 2010 4:19 PM

## 2010-07-18 NOTE — Letter (Signed)
Summary: Patient Notice- Polyp Results  [facility]  [address] [place], [place] [address]   Phone: [phone]  Fax: [phone]        April 10, 2010 MRN: [id number]    Willie Garcia [address] [place], [place]  [address]    Dear [name],  The polyps removed from your colon were adenomatous. This means that they were pre-cancerous or that  they had the potential to change into cancer over time.  I recommend that you have a repeat colonoscopy in 5 years to determine if you have developed any new polyps over time. If you develop any new rectal bleeding, abdominal pain or significant bowel habit changes, please contact us before then.  In addition to repeating colonoscopy, changing health habits may reduce your risk of having more colon polyps and possibly, colon cancer. You may lower your risk of future polyps and colon cancer by adopting healthy habits such as not smoking or using tobacco (if you do), being physically active, losing weight (if overweight), and eating a diet which includes fruits and vegetables and limits red meat.   Please call us if you are having persistent problems or have questions about your condition that have not been fully answered at this time.  Sincerely,  Iva Boop MD, [facility]  This letter has been electronically signed by your physician.  Appended Document: Patient Notice- Polyp Results letter printed and mailed

## 2010-07-18 NOTE — Progress Notes (Signed)
  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

## 2010-07-18 NOTE — Progress Notes (Signed)
Summary: Ba Swallow results  Phone Note Call from Patient Call back at [phone]   Caller: Brenda-spouse Call For: Dr Leone Payor Summary of Call: [facility] Swallow results Initial call taken by: Leanor Kail [facility],  March 23, 2010 8:09 AM  Follow-up for Phone Call        Eva notified results are normal.  She is relieved.  Advised we will see him at endo/colon, but if Mr. Turano is having problems prior to that to call office.   Follow-up by: Francee Piccolo CMA Duncan Dull),  March 23, 2010 12:27 PM

## 2010-07-18 NOTE — Progress Notes (Signed)
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  If he needs a GXT of just regular office visit.  She will call me back and let me know. Dennis Bast, RN, BSN  Nov 04, 2009 11:18 AM Spoke with wife she will call her husband and find out what it is that the [facility] is needing and call me back Dennis Bast, RN, BSN  Nov 04, 2009 11:25 AM pt came by and brought the paper work for [employer].  He wants to pick up himself.  He does not want [place] to mail or fax in.  He wants to be called to pick up the papers when finished.  Very important that he pick up. Dennis Bast, RN, BSN  Nov 07, 2009 5:13 PM   Additional Follow-up for Phone Call Additional follow up Details #1::        Dr Ladona Ridgel reviewed paper work and said that he only needed the note and strees test results.  Called pt will have this outfront and if they need anything else from [place] we will be gald to see him before 01/2010 He will pick up paper work and let me know if anything else is needed Dennis Bast, RN, BSN  Nov 08, 2009 1:43 PM

## 2010-07-18 NOTE — Procedures (Signed)
Summary: Upper Endoscopy w/DIL  Patient: [name] Note: All result statuses are Final unless otherwise noted.  Tests: (1) Upper Endoscopy w/DIL (UED)  UED Upper Endoscopy w/DIL                             DONE     [facility]     [address] [place], [place]  [address]           ENDOSCOPY PROCEDURE REPORT           PATIENT:  Willie Garcia, Willie Garcia  MR#:  [id number]     BIRTHDATE:  1945/11/26, 64 yrs. old  GENDER:  male           ENDOSCOPIST:  Iva Boop, MD, [facility]           PROCEDURE DATE:  04/04/2010     PROCEDURE:  EGD, diagnostic, Maloney Dilation of the Esophagus     ASA CLASS:  Class II     INDICATIONS:  1) dysphagia           MEDICATIONS:   Fentanyl 75 mcg IV, Versed 9 mg IV     TOPICAL ANESTHETIC:  Cetacaine Spray           DESCRIPTION OF PROCEDURE:   After the risks benefits and     alternatives of the procedure were thoroughly explained, informed     consent was obtained.  The  endoscope was introduced through the     mouth and advanced to the second portion of the duodenum, without     limitations.  The instrument was slowly withdrawn as the mucosa     was carefully examined.     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 The UES area seemed tight to     passage of the scope - subjective.  The examination was otherwise     normal.    Dilation was then performed at the total esophagus           1) Dilator:  Elease Hashimoto  Size(s):  54 French     Resistance:  moderate  Heme:  yes           Slight heme.           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     RECOMMENDATIONS:     Clear liquids until 1130 AM then soft foods. Normal foods     tomorrow.     call Dr. Leone Payor if swallowing problems persist.           REPEAT EXAM:  In for as needed.           Iva Boop,  MD, [facility]           CC:  [name], M.D.     The Patient           n.     eSIGNED:   Iva Boop at 04/04/2010 09:57 AM           [place], [id number]  Note: An exclamation mark (!) indicates a result that was not dispersed into the flowsheet. 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

## 2010-07-18 NOTE — Assessment & Plan Note (Signed)
Summary: pos hem stools...em   History of Present Illness Visit Type: consult  Primary GI Garcia: Willie Garcia [facility] Primary Provider: [name], Garcia Requesting Provider: [name], Garcia Chief Complaint: Heme positive stools  History of Present Illness:   65 yo wm with recent screening iFOBT test +. Had a negative colonoscopy Willie Garcia) 2008. Does believe he has had hemorrhoid problems in past. Occasional pain in LLQ. Not new. Not much of a bother overall. Has been there before and after cheotherapy but better after that.  Occasionally unable to swallow large pills but food is ok.  He uses Prilosec intermittently, and also uses Tus. Will get heartburn a couple x/month.    GI Review of Systems    Reports abdominal pain, acid reflux, belching, bloating, dysphagia with solids, heartburn, loss of appetite, and  nausea.     Location of  Abdominal pain: left side.    Denies chest pain, dysphagia with liquids, vomiting, vomiting blood, weight loss, and  weight gain.      Reports constipation, heme positive stool, hemorrhoids, and  rectal bleeding.     Denies anal fissure, black tarry stools, change in bowel habit, diarrhea, diverticulosis, fecal incontinence, irritable bowel syndrome, jaundice, light color stool, liver problems, and  rectal pain.    Colonoscopy  Procedure date:  03/07/2007  Findings:      Results: Normal. Dr. [name]  EGD  Procedure date:  03/07/2007  Findings:      Findings: Normal   Dr. [name]   Current Medications (verified): 1)  Toprol Xl 50 Mg Xr24h-Tab (Metoprolol Succinate) .... Take One Tablet By Mouth Once Daily 2)  Crestor 20 Mg Tabs (Rosuvastatin Calcium) .... One Tablet By Mouth Once Daily 3)  Aspirin 325 Mg  Tabs (Aspirin) .... Take 1 Tablet By Mouth Once A Day 4)  Androgel Pump 1 % Gel (Testosterone) .... Apply Four Pumps To Shoulders/upper Arms/abdomen Every Day 5)  Vitamin C 500 Mg Tabs (Ascorbic Acid) .... Take 1 Tablet By Mouth  Once A Day As Needed 6)  Folic Acid 1 Mg Tabs (Folic Acid) .... Take 1 Tablet By Mouth Once A Day 7)  Multivitamins  Tabs (Multiple Vitamin) .... Take 1 Tablet By Mouth Once A Day 8)  Prilosec Otc 20 Mg Tbec (Omeprazole Magnesium) .... Take 1 Tablet By Mouth Once A Day As Needed 9)  Vicodin 5-500 Mg Tabs (Hydrocodone-Acetaminophen) .... Take As Needed 10)  Niacin 500 Mg Tabs (Niacin) .... As Needed  Allergies (verified): No Known Drug Allergies  Past History:  Past Medical History: Allergies, Seasonal BPH Hypogonadism Non-Hodgkin's Lymphoma Coronary Artery Disease Depression GERD Hyperlipidemia Hypertension Osteopenia Anemia Anxiety Disorder  Past Surgical History: Reviewed history from 01/18/2010 and no changes required. 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  Alcohol Use - no Daily Caffeine Use: 3 daily  Illicit Drug Use - no  Review of Systems       The patient complains of allergy/sinus, anxiety-new, arthritis/joint pain, back pain, depression-new, fatigue, and sleeping problems.         about to have Cspine surgery late August All other ROS negative except as per HPI.   Vital Signs:  Patient profile:   65 year old male Height:      70 inches Weight:      155 pounds BMI:     22.32 BSA:     1.87 Pulse rate:   76 / minute Pulse rhythm:   regular BP sitting:   136 / 60  (left arm) Cuff size:   regular  Vitals Entered By: Ok Anis CMA (January 18, 2010 3:30 PM)  Physical Exam  General:  Well developed, well nourished, no acute distress. Eyes:  PERRLA, no icterus. Mouth:   clear Lungs:  Clear throughout to auscultation. Heart:  Regular rate and rhythm; no murmurs, rubs,  or bruits. Abdomen:  Soft, nontender and nondistended. No masses, hepatosplenomegaly or hernias noted. Normal bowel sounds. Extremities:  no edema Psych:  Alert and cooperative. Normal mood and affect.   Impression & Recommendations:  Problem # 1:  BLOOD IN STOOL, OCCULT (ICD-792.1) Assessment New iFOBT + so blood from colon normal colonoscopy in 2008 some chronic mild and intermittent abdominal pain unlikely to be significant  colonoscopy is appropriate to assess the heme + stool he is not anemic  to have c-spine surgery soon so we will contact him in Sept to arrange for the colonoscopy, cannot schedule before  Patient Instructions: 1)  We will contact you in mid-September to discuss scheduling a colonoscopy. 2)  Copy sent to : R. Avva, Garcia 3)  The medication list was reviewed and reconciled.  All changed / newly prescribed medications were explained.  A complete medication list was provided to the patient / caregiver.

## 2010-07-18 NOTE — Procedures (Signed)
Summary: Colon Prep  Colon Prep   Imported By: Lester Atwood 03/31/2010 08:31:17  _____________________________________________________________________  External Attachment:    Type:   Image     Comment:   External Document

## 2010-07-18 NOTE — Progress Notes (Signed)
Summary: personal call  Phone Note Call from Patient Call back at Home Phone [phone]   Caller: Patient [phone] Reason for Call: Talk to Nurse Summary of Call: per pt calling, personal call.  Initial call taken by: Lorne Skeens,  January 17, 2010 1:02 PM  Follow-up for Phone Call        spoke with pt.   Dr Ladona Ridgel did letter and pt aware.  Will pick up 01/20/10 Dennis Bast, RN, BSN  January 19, 2010 6:23 PM

## 2010-07-18 NOTE — Letter (Signed)
Summary: Previsit letter  [facility]  [address] [place], [place] [address]   Phone: [phone]  Fax: [phone]       02/27/2010 MRN: [id number]  Willie Garcia [address] [place], [place]  [address]  Dear [name],  Welcome to the Gastroenterology Division at [facility].    You are scheduled to see a nurse for your pre-procedure visit on 03-28-10 at 8:30am,  on the 3rd floor at [facility], [address]  We ask that you try to arrive at our office 15 minutes prior to your appointment time to allow for check-in.  Your nurse visit will consist of discussing your medical and surgical history, your immediate family medical history, and your medications.    Please bring a complete list of all your medications or, if you prefer, bring the medication bottles and we will list them.  We will need to be aware of both prescribed and over the counter drugs.  We will need to know exact dosage information as well.  If you are on blood thinners (Coumadin, Plavix, Aggrenox, Ticlid, etc.) please call our office today/prior to your appointment, as we need to consult with your physician about holding your medication.   Please be prepared to read and sign documents such as consent forms, a financial agreement, and acknowledgement forms.  If necessary, and with your consent, a friend or relative is welcome to sit-in on the nurse visit with you.  Please bring your insurance card so that we may make a copy of it.  If your insurance requires a referral to see a specialist, please bring your referral form from your primary care physician.  No co-pay is required for this nurse visit.     If you cannot keep your appointment, please call [phone] to cancel or reschedule prior to your appointment date.  This allows [place] the opportunity to schedule an appointment for another patient in need of care.    Thank you for choosing [facility] for your medical  needs.  We appreciate the opportunity to care for you.  Please visit [place] at our website  to learn more about our practice.                     Sincerely.                                                                                                                   The Gastroenterology Division   (Your procedures are at [facility] on 04-04-10 at 9:15am)  Appended Document: Previsit letter Letter mailed to patient.

## 2010-07-18 NOTE — Procedures (Signed)
Summary: EGD/[facility]  EGD/[facility]   Imported By: Sherian Rein 01/25/2010 07:11:42  _____________________________________________________________________  External Attachment:    Type:   Image     Comment:   External Document

## 2010-07-18 NOTE — Letter (Signed)
Summary: Patient's history  Patient's history   Imported By: Lester [place] 03/31/2010 08:39:49  _____________________________________________________________________  External Attachment:    Type:   Image     Comment:   External Document

## 2010-07-18 NOTE — Assessment & Plan Note (Signed)
Summary: 2:15/rov/kfw   History of Present Illness: Willie Garcia returns today for followup.  He has longstanding CAD, s/p CABG.  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  He is able to perform strenuous exertion with out too much difficulty.  He denies c/p or sob with exertion.  No peripheral edema.  He does complain of fatigue and some weakness after eating.   Current Medications (verified): 1)  Toprol Xl 50 Mg Xr24h-Tab (Metoprolol Succinate) .... Take One Tablet By Mouth Once Daily 2)  Crestor 10 Mg Tabs (Rosuvastatin Calcium) .... Take One Tablet By Mouth Daily. 3)  Aspirin 325 Mg  Tabs (Aspirin) .... Daily  Allergies: No Known Drug Allergies  Past History:  Past Medical History: Last updated: 10/02/2008 DYSLIPIDEMIA (ICD-272.4) HYPERTENSION (ICD-401.9)    Past Surgical History: Last updated: 10/02/2008 CABG Rhinoplasy inguinal hernia repair  Review of Systems  The patient denies chest pain, syncope, dyspnea on exertion, and peripheral edema.    Vital Signs:  Patient profile:   65 year old male Height:      70 inches Weight:      160 pounds BMI:     23.04 Pulse rate:   53 / minute Resp:     16 per minute BP sitting:   101 / 60  (right arm)  Vitals Entered By: Marrion Coy, CNA (January 18, 2009 3:06 PM)  Physical Exam  General:  Well developed, well nourished, in no acute distress. Head:  normocephalic and atraumatic Eyes:  PERRLA/EOM intact; conjunctiva and lids normal. Mouth:  Teeth, gums and palate normal. Oral mucosa normal. Neck:  Neck supple, no JVD. No masses, thyromegaly or abnormal cervical nodes. Chest Wall:  Well healed median sternotomy incision. Lungs:  Clear bilaterally to auscultation and percussion. Heart:  RRR with normal S1 and S2.  PMI is not enlarged or laterally displaced. Abdomen:  Bowel sounds positive; abdomen soft and non-tender without masses, organomegaly, or hernias noted. No hepatosplenomegaly. Msk:  Back normal, normal  gait. Muscle strength and tone normal. Pulses:  pulses normal in all 4 extremities Extremities:  No clubbing or cyanosis. Neurologic:  Alert and oriented x 3.   EKG  Procedure date:  01/18/2009  Findings:      Sinus bradycardia with rate of:  53.  Impression & Recommendations:  Problem # 1:  COR ATHEROSLERO UNSPEC TYPE VESSEL NATIVE/GRAFT (ICD-414.00) Willie Garcia remains largely pain free.  Continue current meds as noted below.  Because he is at times bothered by fatigue, I have instructed him that he may hold his beta blockers at times if he is feeling poorly, particularly if the blood pressure is low as it sometimes is. His updated medication list for this problem includes:    Toprol Xl 50 Mg Xr24h-tab (Metoprolol succinate) .Marland Kitchen... Take one tablet by mouth once daily    Aspirin 325 Mg Tabs (Aspirin) .Marland Kitchen... Daily  Problem # 2:  DYSLIPIDEMIA (ICD-272.4) A low cholesterol diet is recommended. His updated medication list for this problem includes:    Crestor 10 Mg Tabs (Rosuvastatin calcium) .Marland Kitchen... Take one tablet by mouth daily.  Problem # 3:  HYPERTENSION (ICD-401.9) His blood pressure remains well controlled. His updated medication list for this problem includes:    Toprol Xl 50 Mg Xr24h-tab (Metoprolol succinate) .Marland Kitchen... Take one tablet by mouth once daily    Aspirin 325 Mg Tabs (Aspirin) .Marland Kitchen... Daily  Patient Instructions: 1)  Your physician recommends that you schedule a follow-up appointment in: 12 months

## 2010-07-18 NOTE — Letter (Signed)
Summary: Generic Letter  Architectural technologist, Main Office  [address]   [place], [place] [address]   Phone: [phone]  Fax: [phone]    01/19/2010  [name] [address] [place], [place]  [address]  Dear Dr. Williams Che,  I have followed Willie Garcia for almost 12 years.  He does in fact have CAD and is s/p CABG and has done well.  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  He is cleared to work with a breathing apparatus as he has no significant pulmonary disease. He has had cardiac testing in the past including exercise treadmill testing and he is asymptomatic.           Sincerely,   Lewayne Bunting, MD, [facility]

## 2010-07-18 NOTE — Procedures (Signed)
Summary: Colonoscopy/[facility]  Colonoscopy/[facility]   Imported By: Sherian Rein 01/25/2010 07:11:01  _____________________________________________________________________  External Attachment:    Type:   Image     Comment:   External Document

## 2010-07-18 NOTE — Progress Notes (Signed)
Summary: Medical Hx/Patient  Medical Hx/Patient   Imported By: Sherian Rein 01/25/2010 07:16:00  _____________________________________________________________________  External Attachment:    Type:   Image     Comment:   External Document

## 2010-08-03 NOTE — Letter (Addendum)
Summary: [facility]  [facility]   Imported By: Sherian Rein 07/24/2010 11:49:30  _____________________________________________________________________  External Attachment:    Type:   Image     Comment:   External Document

## 2010-08-30 LAB — GLUCOSE, CAPILLARY: Glucose-Capillary: 174 mg/dL — ABNORMAL HIGH (ref 70–99)

## 2010-08-31 LAB — COMPREHENSIVE METABOLIC PANEL
ALT: 15 U/L (ref 0–53)
AST: 18 U/L (ref 0–37)
Albumin: 3.8 g/dL (ref 3.5–5.2)
Alkaline Phosphatase: 100 U/L (ref 39–117)
BUN: 11 mg/dL (ref 6–23)
CO2: 30 mEq/L (ref 19–32)
Calcium: 9.3 mg/dL (ref 8.4–10.5)
Chloride: 103 mEq/L (ref 96–112)
Creatinine, Ser: 0.92 mg/dL (ref 0.4–1.5)
GFR calc Af Amer: >60 mL/min (ref >60)
GFR calc non Af Amer: >60 mL/min (ref >60)
Glucose, Bld: 98 mg/dL (ref 70–99)
Potassium: 4.2 mEq/L (ref 3.5–5.1)
Sodium: 137 mEq/L (ref 135–145)
Total Bilirubin: 0.8 mg/dL (ref 0.3–1.2)
Total Protein: 6.4 g/dL (ref 6.0–8.3)

## 2010-08-31 LAB — SURGICAL PCR SCREEN
MRSA, PCR: NEGATIVE
Staphylococcus aureus: NEGATIVE

## 2010-08-31 LAB — CBC
HCT: 38.2 % — ABNORMAL LOW (ref 39.0–52.0)
Hemoglobin: 12.8 g/dL — ABNORMAL LOW (ref 13.0–17.0)
MCH: 30.5 pg (ref 26.0–34.0)
MCHC: 33.5 g/dL (ref 30.0–36.0)
MCV: 91 fL (ref 78.0–100.0)
Platelets: 205 10*3/uL (ref 150–400)
RBC: 4.2 MIL/uL — ABNORMAL LOW (ref 4.22–5.81)
RDW: 12.9 % (ref 11.5–15.5)
WBC: 6.4 10*3/uL (ref 4.0–10.5)

## 2010-09-18 LAB — POCT RAPID STREP A (OFFICE): Streptococcus, Group A Screen (Direct): NEGATIVE

## 2010-09-28 ENCOUNTER — Other Ambulatory Visit: Payer: Self-pay | Admitting: Dermatology

## 2010-10-06 ENCOUNTER — Encounter: Payer: Self-pay | Admitting: Internal Medicine

## 2010-10-06 ENCOUNTER — Encounter: Payer: Self-pay | Admitting: *Deleted

## 2010-10-09 ENCOUNTER — Encounter: Payer: Self-pay | Admitting: Internal Medicine

## 2010-10-09 ENCOUNTER — Ambulatory Visit (INDEPENDENT_AMBULATORY_CARE_PROVIDER_SITE_OTHER): Payer: Medicare Other | Admitting: Internal Medicine

## 2010-10-09 DIAGNOSIS — I2510 Atherosclerotic heart disease of native coronary artery without angina pectoris: Secondary | ICD-10-CM

## 2010-10-09 DIAGNOSIS — R0989 Other specified symptoms and signs involving the circulatory and respiratory systems: Secondary | ICD-10-CM

## 2010-10-09 DIAGNOSIS — R0609 Other forms of dyspnea: Secondary | ICD-10-CM

## 2010-10-09 DIAGNOSIS — I10 Essential (primary) hypertension: Secondary | ICD-10-CM

## 2010-10-09 DIAGNOSIS — R0600 Dyspnea, unspecified: Secondary | ICD-10-CM

## 2010-10-09 NOTE — Assessment & Plan Note (Signed)
His blood pressure today is well controlled. Ultimately we may have to reduce his beta blocker.

## 2010-10-09 NOTE — Assessment & Plan Note (Signed)
He has a strong family history of ruptured abdominal aneurysm. His exam today does suggest an abdominal bruit and perhaps an enlarged abdominal aorta. I recommended that he undergo abdominal ultrasonography for this evaluation.

## 2010-10-09 NOTE — Progress Notes (Signed)
HPI Willie Garcia returns today for followup. He is a pleasant 65 year old man with premature coronary disease, hypertension, dyslipidemia, and sinus bradycardia. The patient notes that over the last several weeks he has had increasing fatigue weakness and dyspnea. He has not had chest pain. He is status post bypass surgery and on presentation to the hospital at that time, he did not have much in the way of chest pain. The patient does have a strong family history of vascular disease including ruptured abdominal aneurysm. He denies abdominal pain. No Known Allergies   Current Outpatient Prescriptions  Medication Sig Dispense Refill  . Ascorbic Acid (VITAMIN C) 500 MG tablet Take 500 mg by mouth daily.        Marland Kitchen aspirin 325 MG tablet Take 325 mg by mouth daily.        . folic acid (FOLVITE) 1 MG tablet Take 1 mg by mouth daily.        Marland Kitchen HYDROcodone-acetaminophen (VICODIN) 5-500 MG per tablet Take 1 tablet by mouth every 6 (six) hours as needed.        . metoprolol (TOPROL-XL) 50 MG 24 hr tablet Take 50 mg by mouth daily.        . Multiple Vitamin (MULTIVITAMIN) capsule Take 1 capsule by mouth daily.        . rosuvastatin (CRESTOR) 20 MG tablet Take 20 mg by mouth daily.        . Testosterone 30 MG/ACT SOLN Place onto the skin as directed.           Past Medical History  Diagnosis Date  . Allergic rhinitis   . Non Hodgkin's lymphoma   . CAD (coronary artery disease)   . Depression   . GERD (gastroesophageal reflux disease)   . Hyperlipidemia   . HTN (hypertension)   . Osteopenia   . Anemia   . Anxiety     ROS:   All systems reviewed and negative except as noted in the HPI.   Past Surgical History  Procedure Date  . Coronary artery bypass graft   . Rhinoplasty   . Hernia repair      Family History  Problem Relation Age of Onset  . Colonic polyp    . Diabetes    . Heart disease       History   Social History  . Marital Status: Married    Spouse Name: N/A    Number of  Children: N/A  . Years of Education: N/A   Occupational History  . Not on file.   Social History Main Topics  . Smoking status: Never Smoker   . Smokeless tobacco: Not on file  . Alcohol Use: No  . Drug Use: No  . Sexually Active: Not on file   Other Topics Concern  . Not on file   Social History Narrative  . No narrative on file     BP 92/50  Pulse 54  Physical Exam:  Then, ill appearing NAD HEENT: Unremarkable Neck:  No JVD, no thyromegally Lymphatics:  No adenopathy Back:  No CVA tenderness Lungs:  Clear.no wheezes or rhonchi. HEART:  Regular rate rhythm, no murmurs, no rubs, no clicks Abd:  Flat, positive bowel sounds, no organomegally, no rebound, no guarding Ext:  2 plus pulses, no edema, no cyanosis, no clubbing Skin:  No rashes no nodules Neuro:  CN II through XII intact, motor grossly intact  EKG Sinus bradycardia with rightward axis. Assess/Plan:

## 2010-10-09 NOTE — Assessment & Plan Note (Signed)
The patient does not have anginal symptoms but does note increasing shortness of breath. I recommended that he undergo transthoracic echocardiography. Additional testing will be based on the results of his echo.

## 2010-10-09 NOTE — Patient Instructions (Signed)
Your physician has requested that you have an abdominal aorta duplex. During this test, an ultrasound is used to evaluate the aorta. Allow 30 minutes for this exam. Do not eat after midnight the day before and avoid carbonated beverages Your physician has requested that you have an echocardiogram. Echocardiography is a painless test that uses sound waves to create images of your heart. It provides your doctor with information about the size and shape of your heart and how well your heart's chambers and valves are working. This procedure takes approximately one hour. There are no restrictions for this procedure.  Your physician recommends that you schedule a follow-up appointment in: based on the results of your testing.

## 2010-10-17 ENCOUNTER — Encounter: Payer: Self-pay | Admitting: Internal Medicine

## 2010-10-19 ENCOUNTER — Ambulatory Visit (HOSPITAL_COMMUNITY): Payer: Medicare Other | Attending: Internal Medicine | Admitting: Radiology

## 2010-10-19 ENCOUNTER — Encounter (INDEPENDENT_AMBULATORY_CARE_PROVIDER_SITE_OTHER): Payer: Medicare Other | Admitting: Cardiology

## 2010-10-19 DIAGNOSIS — R0609 Other forms of dyspnea: Secondary | ICD-10-CM | POA: Insufficient documentation

## 2010-10-19 DIAGNOSIS — R0989 Other specified symptoms and signs involving the circulatory and respiratory systems: Secondary | ICD-10-CM

## 2010-10-19 DIAGNOSIS — R0600 Dyspnea, unspecified: Secondary | ICD-10-CM

## 2010-10-24 ENCOUNTER — Encounter: Payer: Self-pay | Admitting: Internal Medicine

## 2010-10-31 NOTE — Assessment & Plan Note (Signed)
[facility]                         ELECTROPHYSIOLOGY OFFICE NOTE   [name], [name]                       MRN:          [id number]  DATE:01/27/2007                            DOB:          [birth year]    REASON FOR VISIT:  Willie Garcia returns today for followup.  He is a very  pleasant 65 year old man with a history of ischemic heart disease status  post bypass surgery back in __________ .  He has a history of non-  Hodgkin's lymphoma and is status post initial chemotherapy just over a  year ago.  He states that his lymphoma has been stable and is not  growing according to his hematologist, Dr. Darnelle Catalan.  Overall, he feels  well except for some mild fatigue.  He wonders about whether he has  developed blockages in the blood vessels in his neck.  The patient  denies any localizing signs or symptoms of carotid disease but has  multiple risk factors.  He denies chest pain or shortness of breath.  He  does note chronic fatigue, question whether related to beta blockers.   MEDICATIONS:  His medications include:  1. Toprol XL 50 daily.  2. Aspirin 325 mg daily.  3. Zocor 40 mg daily.  4. Depotestosterone injections.   PHYSICAL EXAMINATION:  VITAL SIGNS:  Blood pressure was 142/90.  Pulse  was 76 and regular.  Respirations 18.  Weight was 152 pounds.  NECK:  Reveals no jugular venous distention.  His carotids were  decreased bilaterally, though I do not appreciate a bruit.  His  pulsations were clearly reduced.  I have not appreciated this in times  past.  LUNGS:  Clear bilaterally to auscultation.  No rales, rhonchi or  wheezing are present.  CARDIOVASCULAR:  Exam reveals a regular rate and rhythm with normal S1  and S2.  EXTREMITIES:  Demonstrate no edema.   CLINICAL DATA:  EKG today demonstrates sinus rhythm with normal axis and  intervals.   IMPRESSION:  1. Ischemic heart disease with preserved left ventricular function,      status post  bypass surgery.  2. Dyslipidemia on Statin therapy.  3. Questionable carotid artery stenosis manifested by decreased      pulsations in the neck.   DISCUSSION:  Overall, Willie Garcia is stable.  I will plan to have him  undergo carotid ultrasounding and I have asked that he change his Toprol  from taking it during the day to taking it at night to see if this helps  relieve his fatigue.  Ultimately we may have him try to decrease the  dose initially to see if this improves his symptoms of fatigue and  weakness.  We will see him back in the office in one year, sooner should  he have additional problems.     Doylene Canning. Ladona Ridgel, MD  Electronically Signed    GWT/MedQ  DD: 01/27/2007  DT: 01/28/2007  Job #: [id number]   cc:   Olene Craven, M.D.

## 2010-10-31 NOTE — Assessment & Plan Note (Signed)
[facility]                         ELECTROPHYSIOLOGY OFFICE NOTE   SIDDARTH, HSIUNG                       MRN:          [id number]  DATE:08/23/2008                            DOB:          [birth year]    [name] returns today for follow up.  He is a very pleasant middle-  aged male with longstanding ischemic heart disease status post bypass  surgery over 10 years ago.  The patient's LV function has previously  been normal, but he has developed worsening shortness of breath, fatigue  and weakness.  His baseline EKG is abnormal with diffuse T-wave  abnormalities with T-wave flattening across his anterior precordium and  inferior leads, and he is now referred for evaluation.  The patient  denies frank anginal symptoms.   PAST MEDICAL HISTORY:  1. Hypertension.  2. Dyslipidemia.   PHYSICAL EXAMINATION:  GENERAL:  He is a pleasant middle-aged man in no  distress.  VITAL SIGNS:  Blood pressure is 94/60, the pulse 56 and regular,  respirations were 18.  Weight was 159 pounds.  NECK:  No jugular venous distention.  There is no thyromegaly.  LUNGS:  Clear bilaterally to auscultation.  No wheezes, rales or  rhonchi.  CARDIAC:  Regular rate and rhythm.  Normal S1 and S2.  The PMI was not  enlarged or laterally displaced.  ABDOMEN:  Soft, nontender.  EXTREMITIES:  No edema.   ELECTROCARDIOGRAM:  EKG as previously noted.  Sinus rhythm with  nonspecific T-wave abnormality.   IMPRESSION:  1. Ischemic heart disease status post bypass surgery now almost 11      years ago.  2. Worsening fatigue, shortness of breath and dyspnea.  3. Dyslipidemia.  4. Hypertension.   DISCUSSION:  I have discussed the treatment options with the patient.  I  have recommended that we proceed with exercise treadmill testing, as he  has had worsening fatigue, dyspnea and shortness of breath.  Additional  treatment recommendations will be based on the findings of his  stress  test.     Willie Canning. Ladona Ridgel, MD  Electronically Signed    GWT/MedQ  DD: 09/06/2008  DT: 09/06/2008  Job #: [id number]

## 2010-10-31 NOTE — Assessment & Plan Note (Signed)
[facility]                         ELECTROPHYSIOLOGY OFFICE NOTE   LAQUINTON, BIHM                       MRN:          [id number]  DATE:08/07/2007                            DOB:          [birth year]    HISTORY:  Mr. Kolenda returns today for followup.  He is a very pleasant  middle-aged male with ischemic heart disease status post bypass surgery,  dyslipidemia, hypertension, and diagnosis of non-Hodgkin's lymphoma  status post chemotherapy.  He returns today for followup.  The patient  complains of some fatigue and weakness, but overall has been stable.  He  has had no chest pain.  He does note that his cholesterol numbers which  were checked several months ago had worsened.  He is on Vytorin 10/40.  He has had a chronically low HDL despite being on statin therapy.   MEDICATIONS:  1. Toprol XL 50 a day.  2. Aspirin 325 a day.  3. Testosterone injections.  4. Vytorin 10/40.  5. Lexapro.  6. Folate.   PHYSICAL EXAMINATION:  GENERAL:  He is a pleasant middle-aged man in no  acute distress.  VITAL SIGNS:  Blood pressure was 136/83, pulse was 80 and regular,  respirations were 18.  Weight was 160 pounds.  NECK:  Revealed no jugular venous distention.  LUNGS:  Clear bilaterally to auscultation.  No wheezes, rales or rhonchi  are present.  CARDIOVASCULAR:  Regular rate and rhythm.  Normal S1-S2.  ABDOMINAL:  Soft, nontender.  There is a question of splenomegaly.  EXTREMITIES:  Demonstrated no edema.   DIAGNOSTICS:  EKG demonstrates sinus rhythm with nonspecific T-wave  abnormality.   IMPRESSION:  1. Ischemic heart disease.  2. Dyslipidemia.  3. Hypertension.  4. Non-Hodgkin's lymphoma.   DISCUSSION:  Mr. Akram is stable today.  We will plan on continuing his  current medical therapy.  I have asked that he start trying Niaspan 500  a day.  We will give him a prescription for  Niaspan if he is able to tolerate this medication or  alternatively  consider switching him to Simcor.  Will see him back in the office in 1  year or sooner should he have additional problems.     Doylene Canning. Ladona Ridgel, MD  Electronically Signed    GWT/MedQ  DD: 08/07/2007  DT: 08/08/2007  Job #: [id number]   cc:   Larina Earthly, M.D.  Valentino Hue. Magrinat, M.D.

## 2010-10-31 NOTE — Op Note (Signed)
Willie Garcia, NIGH                ACCOUNT NO.:  [id number]   MEDICAL RECORD NO.:  [id number]          PATIENT TYPE:  AMB   LOCATION:  DSC                          FACILITY:  [facility]   PHYSICIAN:  Katy Fitch. Sypher, M.D. DATE OF BIRTH:  1946-05-10   DATE OF PROCEDURE:  03/13/2007  DATE OF DISCHARGE:  03/13/2007                               OPERATIVE REPORT   PREOPERATIVE DIAGNOSIS:  Mass volar aspect of right wrist with positive  Tinel's sign.   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   OPERATION:  Excisional biopsy of mass right volar forearm.   SURGEON:  Katy Fitch. Sypher, M.D.   ASSISTANT:  None.   ANESTHESIA:  IV regional.   SUPERVISING ANESTHESIOLOGIST:  Burna Forts, M.D.   INDICATIONS:  [name] is a 65 year old gentleman referred for  evaluation and management of a mass on the volar aspect of his right  wrist.  The mass was 2 cm proximal to his distal wrist flexion crease  and had a positive Tinel's sign over the mass.  Our concern was this may  be a nerve element tumor.  Due to history of non-Hodgkins lymphoma, Mr.  Bircher requested that a biopsy be performed to rule out a worrisome  disease process.  After informed consent, he is brought to the operating  at this time.   PROCEDURE:  Natanel Snavely is brought to the operating room and placed in  the supine position on the operating table.  Following an anesthesia  consult with Dr. Jacklynn Bue, an IV regional block was placed without  complication.  Mr. Melichar was transferred to room 8 and placed in the  supine position on the operating table and under Dr. Marlane Mingle direct  supervision, IV sedation provided followed by placement of an IV  regional block with the tourniquet on the proximal brachium.  After ten  minutes excellent anesthesia was achieved.  The arm was then prepped  with Betadine soap solution and  sterilely draped.   The procedure commenced with an oblique incision directly over the mass.  The subcutaneous tissues were carefully divided taking care to identify  an opaque solid mass measuring approximately 7 mm diameter that appeared  to be attached to a cutaneous sensory branch.  This was not the palmar  cutaneous branch proper.  The mass was separated from a volar vein and  circumferentially dissected and resected with a margin of normal  appearing tissue.   The wound was then inspected for bleeding points which were  electrocauterized with bipolar current followed by repair of the skin  with intradermal 3-0 Prolene and Steri-Strip.  2% lidocaine was  infiltrated for postop analgesia.  A compressive dressing was applied  with sterile gauze and an Ace wrap.  There no apparent complications.      Katy Fitch Sypher, M.D.  Electronically Signed     RVS/MEDQ  D:  03/13/2007  T:  03/13/2007  Job:  [id number]   cc:   Olene Craven, M.D.

## 2010-10-31 NOTE — Assessment & Plan Note (Signed)
[facility]                         ELECTROPHYSIOLOGY OFFICE NOTE   Willie Garcia, Willie Garcia                       MRN:          [id number]  DATE:11/20/2007                            DOB:          June 06, 1946    Willie Garcia returns today for follow-up.  He is a very pleasant middle-  aged male with ischemic heart disease, shortness of breath and dyspnea.  I saw him in the office several weeks ago, and he complained of  shortness of breath.  He also notes fatigue; and for this reason, he  underwent an echocardiogram today in preparation for follow-up with me.  The patient states that he continues to feel fatigued and weak, though  he is better after cutting back on his beta blocker.  The patient also  notes that he has muscle aches since he has been placed on Vytorin; he  asked if there was an alternative.   CURRENT MEDICATIONS:  1. Toprol XL 50 mg daily.  2. Aspirin 325 mg daily.  3. Testosterone injection.  4. Vytorin 10/40 mg daily.  5. Lexapro 10 mg daily.  6. Folate 1 mg daily.  7. Multiple vitamin and niacin, which he has not started taking.   PHYSICAL EXAMINATION:  He is a pleasant middle-aged man in no acute  distress.  His affect is somewhat blunted.  His blood pressure was  119/77, the pulse 69 and regular, respirations were 18.  Weight was 157  pounds.  NECK:  Revealed no jugular distention.  LUNGS:  Clear bilaterally to auscultation.  No wheezes, rales or rhonchi  are present.  CARDIOVASCULAR:  Exam revealed a regular rate and rhythm.  Normal S1 and  S2.  There was a soft S4 gallop.  ABDOMINAL:  Scaphoid, soft, nontender, nondistended.  EXTREMITIES:  Demonstrated no edema.   DIAGNOSTIC TESTING:  A 2-D echo demonstrates preliminary review with  normal LV systolic function.   IMPRESSION:  1. Ischemic heart disease, status post bypass surgery; with now      preserved left ventricular function.  2. Hypertension.  3. Dyslipidemia.  4.  Dysthymia.   DISCUSSION:  Overall Willie Garcia is stable today.  However. because of  his leg cramps. I have asked that he decrease his Vytorin and switch him  over to Crestor 5 mg and then 10 mg daily.  I have asked that he cut  back on his Toprol because of a sense of fatigue.   We will see him back in the office in several months.     Doylene Canning. Ladona Ridgel, MD  Electronically Signed    GWT/MedQ  DD: 11/20/2007  DT: 11/20/2007  Job #: [id number]

## 2010-11-01 ENCOUNTER — Telehealth: Payer: Self-pay | Admitting: Internal Medicine

## 2010-11-01 NOTE — Telephone Encounter (Signed)
Pt was called with results of test but did not get all of it off of his phone.  Please call wife and give her results at [phone] ext [phone].  She would also like to get a copy of the test faxed to her.  [phone] [name].

## 2010-11-01 NOTE — Telephone Encounter (Signed)
Willie Garcia pt's wife called for echo and Duplex of the abdominal Aorta. Wife states a message was left in her answer service, but she was unable to hear all the message in it. Results given. I let wife know that Dr. Ladona Ridgel has not review the echo results. Wife states to fax her the results. She will way for any recommendations from MD. Test results faxed to pt's wife to [phone].

## 2010-11-08 NOTE — Telephone Encounter (Signed)
Faxed to wife today

## 2010-11-09 ENCOUNTER — Telehealth: Payer: Self-pay | Admitting: Internal Medicine

## 2010-11-09 NOTE — Telephone Encounter (Signed)
Pt returning your call. Pt # [id number] ext [phone]

## 2010-11-09 NOTE — Telephone Encounter (Signed)
Left message with the secretary to call back.

## 2010-11-09 NOTE — Telephone Encounter (Signed)
Discuss any follow up an Test results.

## 2010-11-09 NOTE — Telephone Encounter (Signed)
Patient's wife said received echo and aortic U/S results. She would like to know " IVC vessel  Dilated " in the echo result. Is it significant to be concern.

## 2010-11-14 NOTE — Telephone Encounter (Signed)
Dr Ladona Ridgel reviewed and echo is ok  [facility] for wife

## 2010-11-16 ENCOUNTER — Encounter (HOSPITAL_COMMUNITY): Payer: Self-pay | Admitting: Internal Medicine

## 2011-01-05 ENCOUNTER — Other Ambulatory Visit: Payer: Self-pay | Admitting: Oncology

## 2011-01-05 ENCOUNTER — Encounter (HOSPITAL_BASED_OUTPATIENT_CLINIC_OR_DEPARTMENT_OTHER): Payer: Medicare Other | Admitting: Oncology

## 2011-01-05 DIAGNOSIS — C8299 Follicular lymphoma, unspecified, extranodal and solid organ sites: Secondary | ICD-10-CM

## 2011-01-05 LAB — CBC WITH DIFFERENTIAL/PLATELET
BASO%: 0.4 % (ref 0.0–2.0)
Basophils Absolute: 0 10*3/uL (ref 0.0–0.1)
EOS%: 1.8 % (ref 0.0–7.0)
Eosinophils Absolute: 0.1 10*3/uL (ref 0.0–0.5)
HCT: 38.4 % (ref 38.4–49.9)
HGB: 12.8 g/dL — ABNORMAL LOW (ref 13.0–17.1)
LYMPH%: 18 % (ref 14.0–49.0)
MCH: 30.3 pg (ref 27.2–33.4)
MCHC: 33.3 g/dL (ref 32.0–36.0)
MCV: 90.8 fL (ref 79.3–98.0)
MONO#: 0.5 10*3/uL (ref 0.1–0.9)
MONO%: 7 % (ref 0.0–14.0)
NEUT#: 5.6 10*3/uL (ref 1.5–6.5)
NEUT%: 72.8 % (ref 39.0–75.0)
Platelets: 209 10*3/uL (ref 140–400)
RBC: 4.23 10*6/uL (ref 4.20–5.82)
RDW: 13.2 % (ref 11.0–14.6)
WBC: 7.7 10*3/uL (ref 4.0–10.3)
lymph#: 1.4 10*3/uL (ref 0.9–3.3)

## 2011-01-08 LAB — URIC ACID: Uric Acid, Serum: 4.7 mg/dL (ref 4.0–7.8)

## 2011-01-08 LAB — COMPREHENSIVE METABOLIC PANEL
ALT: 9 U/L (ref 0–53)
AST: 15 U/L (ref 0–37)
Albumin: 4 g/dL (ref 3.5–5.2)
Alkaline Phosphatase: 86 U/L (ref 39–117)
BUN: 12 mg/dL (ref 6–23)
CO2: 25 mEq/L (ref 19–32)
Calcium: 8.8 mg/dL (ref 8.4–10.5)
Chloride: 100 mEq/L (ref 96–112)
Creatinine, Ser: 1.07 mg/dL (ref 0.50–1.35)
Glucose, Bld: 164 mg/dL — ABNORMAL HIGH (ref 70–99)
Potassium: 4 mEq/L (ref 3.5–5.3)
Sodium: 138 mEq/L (ref 135–145)
Total Bilirubin: 0.6 mg/dL (ref 0.3–1.2)
Total Protein: 6.3 g/dL (ref 6.0–8.3)

## 2011-01-08 LAB — LACTATE DEHYDROGENASE: LDH: 105 U/L (ref 94–250)

## 2011-01-08 LAB — BETA 2 MICROGLOBULIN, SERUM: Beta-2 Microglobulin: 1.54 mg/L (ref 1.01–1.73)

## 2011-01-08 LAB — IGM: IgM, Serum: 88 mg/dL (ref 41–251)

## 2011-01-09 ENCOUNTER — Encounter (HOSPITAL_BASED_OUTPATIENT_CLINIC_OR_DEPARTMENT_OTHER): Payer: Medicare Other | Admitting: Oncology

## 2011-01-09 DIAGNOSIS — Z23 Encounter for immunization: Secondary | ICD-10-CM

## 2011-01-09 DIAGNOSIS — C8589 Other specified types of non-Hodgkin lymphoma, extranodal and solid organ sites: Secondary | ICD-10-CM

## 2011-01-09 DIAGNOSIS — C8299 Follicular lymphoma, unspecified, extranodal and solid organ sites: Secondary | ICD-10-CM

## 2011-03-28 ENCOUNTER — Other Ambulatory Visit: Payer: Self-pay | Admitting: Oncology

## 2011-03-28 ENCOUNTER — Encounter (HOSPITAL_BASED_OUTPATIENT_CLINIC_OR_DEPARTMENT_OTHER): Payer: Medicare Other | Admitting: Oncology

## 2011-03-28 DIAGNOSIS — C8589 Other specified types of non-Hodgkin lymphoma, extranodal and solid organ sites: Secondary | ICD-10-CM

## 2011-03-28 DIAGNOSIS — Z23 Encounter for immunization: Secondary | ICD-10-CM

## 2011-03-28 DIAGNOSIS — C8299 Follicular lymphoma, unspecified, extranodal and solid organ sites: Secondary | ICD-10-CM

## 2011-03-28 LAB — CBC WITH DIFFERENTIAL/PLATELET
BASO%: 0.5 % (ref 0.0–2.0)
Basophils Absolute: 0 10*3/uL (ref 0.0–0.1)
EOS%: 1.4 % (ref 0.0–7.0)
Eosinophils Absolute: 0.1 10*3/uL (ref 0.0–0.5)
HCT: 41.1 % (ref 38.4–49.9)
HGB: 14.2 g/dL (ref 13.0–17.1)
LYMPH%: 23.7 % (ref 14.0–49.0)
MCH: 31.4 pg (ref 27.2–33.4)
MCHC: 34.6 g/dL (ref 32.0–36.0)
MCV: 90.8 fL (ref 79.3–98.0)
MONO#: 0.4 10*3/uL (ref 0.1–0.9)
MONO%: 7.7 % (ref 0.0–14.0)
NEUT#: 3.2 10*3/uL (ref 1.5–6.5)
NEUT%: 66.7 % (ref 39.0–75.0)
Platelets: 187 10*3/uL (ref 140–400)
RBC: 4.53 10*6/uL (ref 4.20–5.82)
RDW: 13 % (ref 11.0–14.6)
WBC: 4.9 10*3/uL (ref 4.0–10.3)
lymph#: 1.1 10*3/uL (ref 0.9–3.3)

## 2011-03-29 LAB — POCT HEMOGLOBIN-HEMACUE
Hemoglobin: 11.3 — ABNORMAL LOW
Operator id: 15436

## 2011-03-29 LAB — COMPREHENSIVE METABOLIC PANEL
ALT: 11 U/L (ref 0–53)
AST: 19 U/L (ref 0–37)
Albumin: 4.6 g/dL (ref 3.5–5.2)
Alkaline Phosphatase: 89 U/L (ref 39–117)
BUN: 17 mg/dL (ref 6–23)
CO2: 30 mEq/L (ref 19–32)
Calcium: 9.6 mg/dL (ref 8.4–10.5)
Chloride: 101 mEq/L (ref 96–112)
Creatinine, Ser: 1.02 mg/dL (ref 0.50–1.35)
Glucose, Bld: 89 mg/dL (ref 70–99)
Potassium: 4.2 mEq/L (ref 3.5–5.3)
Sodium: 140 mEq/L (ref 135–145)
Total Bilirubin: 0.9 mg/dL (ref 0.3–1.2)
Total Protein: 7 g/dL (ref 6.0–8.3)

## 2011-03-29 LAB — BASIC METABOLIC PANEL
BUN: 11
CO2: 32
Calcium: 9.1
Chloride: 103
Creatinine, Ser: 1.02
GFR calc Af Amer: >60
GFR calc non Af Amer: >60
Glucose, Bld: 93
Potassium: 4.1
Sodium: 140

## 2011-03-29 LAB — URIC ACID: Uric Acid, Serum: 4.9 mg/dL (ref 4.0–7.8)

## 2011-03-29 LAB — IGM: IgM, Serum: 96 mg/dL (ref 41–251)

## 2011-03-29 LAB — BETA 2 MICROGLOBULIN, SERUM: Beta-2 Microglobulin: 1.82 mg/L — ABNORMAL HIGH (ref 1.01–1.73)

## 2011-03-29 LAB — LACTATE DEHYDROGENASE: LDH: 128 U/L (ref 94–250)

## 2011-05-24 ENCOUNTER — Telehealth: Payer: Self-pay | Admitting: *Deleted

## 2011-05-24 NOTE — Telephone Encounter (Signed)
left voice message to inform the patient of the new date and time in 06-28-2011 at 10:00am lab 07-05-2011 at 12:00pm

## 2011-05-25 ENCOUNTER — Telehealth: Payer: Self-pay | Admitting: Oncology

## 2011-05-25 NOTE — Telephone Encounter (Signed)
pts wife rtn call and confirm appts for jan2013

## 2011-05-31 ENCOUNTER — Other Ambulatory Visit: Payer: Self-pay | Admitting: Oncology

## 2011-05-31 ENCOUNTER — Other Ambulatory Visit (HOSPITAL_BASED_OUTPATIENT_CLINIC_OR_DEPARTMENT_OTHER): Payer: Medicare Other | Admitting: Lab

## 2011-05-31 DIAGNOSIS — Z23 Encounter for immunization: Secondary | ICD-10-CM

## 2011-05-31 DIAGNOSIS — C8299 Follicular lymphoma, unspecified, extranodal and solid organ sites: Secondary | ICD-10-CM

## 2011-05-31 DIAGNOSIS — C8589 Other specified types of non-Hodgkin lymphoma, extranodal and solid organ sites: Secondary | ICD-10-CM

## 2011-06-01 LAB — BETA 2 MICROGLOBULIN, SERUM: Beta-2 Microglobulin: 1.51 mg/L (ref 1.01–1.73)

## 2011-06-21 DIAGNOSIS — I2510 Atherosclerotic heart disease of native coronary artery without angina pectoris: Secondary | ICD-10-CM | POA: Diagnosis not present

## 2011-06-21 DIAGNOSIS — M5030 Other cervical disc degeneration, unspecified cervical region: Secondary | ICD-10-CM | POA: Diagnosis not present

## 2011-06-21 DIAGNOSIS — M899 Disorder of bone, unspecified: Secondary | ICD-10-CM | POA: Diagnosis not present

## 2011-06-21 DIAGNOSIS — E785 Hyperlipidemia, unspecified: Secondary | ICD-10-CM | POA: Diagnosis not present

## 2011-06-21 DIAGNOSIS — I10 Essential (primary) hypertension: Secondary | ICD-10-CM | POA: Diagnosis not present

## 2011-06-27 DIAGNOSIS — R059 Cough, unspecified: Secondary | ICD-10-CM | POA: Diagnosis not present

## 2011-06-27 DIAGNOSIS — R05 Cough: Secondary | ICD-10-CM | POA: Diagnosis not present

## 2011-06-28 ENCOUNTER — Other Ambulatory Visit: Payer: Medicare Other | Admitting: Lab

## 2011-07-02 ENCOUNTER — Other Ambulatory Visit: Payer: Self-pay | Admitting: Oncology

## 2011-07-02 ENCOUNTER — Other Ambulatory Visit (HOSPITAL_BASED_OUTPATIENT_CLINIC_OR_DEPARTMENT_OTHER): Payer: Medicare Other | Admitting: Lab

## 2011-07-02 DIAGNOSIS — C8299 Follicular lymphoma, unspecified, extranodal and solid organ sites: Secondary | ICD-10-CM

## 2011-07-02 DIAGNOSIS — C8589 Other specified types of non-Hodgkin lymphoma, extranodal and solid organ sites: Secondary | ICD-10-CM

## 2011-07-02 DIAGNOSIS — Z23 Encounter for immunization: Secondary | ICD-10-CM

## 2011-07-02 LAB — CBC WITH DIFFERENTIAL/PLATELET
BASO%: 0.5 % (ref 0.0–2.0)
Basophils Absolute: 0 10*3/uL (ref 0.0–0.1)
EOS%: 1.6 % (ref 0.0–7.0)
Eosinophils Absolute: 0.1 10*3/uL (ref 0.0–0.5)
HCT: 39.3 % (ref 38.4–49.9)
HGB: 13.5 g/dL (ref 13.0–17.1)
LYMPH%: 25.4 % (ref 14.0–49.0)
MCH: 31.5 pg (ref 27.2–33.4)
MCHC: 34.3 g/dL (ref 32.0–36.0)
MCV: 92.1 fL (ref 79.3–98.0)
MONO#: 0.4 10*3/uL (ref 0.1–0.9)
MONO%: 6.8 % (ref 0.0–14.0)
NEUT#: 3.6 10*3/uL (ref 1.5–6.5)
NEUT%: 65.7 % (ref 39.0–75.0)
Platelets: 288 10*3/uL (ref 140–400)
RBC: 4.27 10*6/uL (ref 4.20–5.82)
RDW: 12.7 % (ref 11.0–14.6)
WBC: 5.6 10*3/uL (ref 4.0–10.3)
lymph#: 1.4 10*3/uL (ref 0.9–3.3)

## 2011-07-03 LAB — URIC ACID: Uric Acid, Serum: 6.1 mg/dL (ref 4.0–7.8)

## 2011-07-03 LAB — COMPREHENSIVE METABOLIC PANEL
ALT: <8 U/L (ref 0–53)
AST: 13 U/L (ref 0–37)
Albumin: 4.1 g/dL (ref 3.5–5.2)
Alkaline Phosphatase: 85 U/L (ref 39–117)
BUN: 15 mg/dL (ref 6–23)
CO2: 30 mEq/L (ref 19–32)
Calcium: 8.9 mg/dL (ref 8.4–10.5)
Chloride: 101 mEq/L (ref 96–112)
Creatinine, Ser: 1.13 mg/dL (ref 0.50–1.35)
Glucose, Bld: 112 mg/dL — ABNORMAL HIGH (ref 70–99)
Potassium: 4.3 mEq/L (ref 3.5–5.3)
Sodium: 140 mEq/L (ref 135–145)
Total Bilirubin: 0.5 mg/dL (ref 0.3–1.2)
Total Protein: 6.6 g/dL (ref 6.0–8.3)

## 2011-07-03 LAB — BETA 2 MICROGLOBULIN, SERUM: Beta-2 Microglobulin: 1.73 mg/L (ref 1.01–1.73)

## 2011-07-03 LAB — LACTATE DEHYDROGENASE: LDH: 119 U/L (ref 94–250)

## 2011-07-03 LAB — IGM: IgM, Serum: 100 mg/dL (ref 41–251)

## 2011-07-05 ENCOUNTER — Ambulatory Visit (HOSPITAL_BASED_OUTPATIENT_CLINIC_OR_DEPARTMENT_OTHER): Payer: Medicare Other | Admitting: Oncology

## 2011-07-05 VITALS — BP 102/71 | HR 65 | Temp 97.7°F | Ht 69.0 in | Wt 144.9 lb

## 2011-07-05 DIAGNOSIS — C8290 Follicular lymphoma, unspecified, unspecified site: Secondary | ICD-10-CM

## 2011-07-05 DIAGNOSIS — Z87898 Personal history of other specified conditions: Secondary | ICD-10-CM

## 2011-07-05 DIAGNOSIS — Z9221 Personal history of antineoplastic chemotherapy: Secondary | ICD-10-CM

## 2011-07-05 DIAGNOSIS — C8590 Non-Hodgkin lymphoma, unspecified, unspecified site: Secondary | ICD-10-CM

## 2011-07-07 DIAGNOSIS — C8290 Follicular lymphoma, unspecified, unspecified site: Secondary | ICD-10-CM | POA: Insufficient documentation

## 2011-07-07 NOTE — Progress Notes (Signed)
ID: ZAMARI VEA  DOB: May 02, 1946  MR#: [id number]  CSN#: [id number]   Interval History:   Montavious returns with his wife [name] for followup of his non-Hodgkin's lymphoma. The interval history is unremarkable. He does not exercise regularly, but is very active at [employer] and doing yard work. He has aches and pains you're and air which are not more intense or frequent. [name] is concerned that he is not eating enough, but he feels he has an adequate appetite and his weight is relatively stable.   ROS:  Asked what his worse problem is he says his difficulty sleeping, and he has some cold intolerance as well. There have been no fevers, no adenopathy that he is aware of, no rash, no pruritus, no swelling, and generally no "B" symptoms. A detailed review of systems was otherwise stable.  No Known Allergies  Current Outpatient Prescriptions  Medication Sig Dispense Refill  . Ascorbic Acid (VITAMIN C) 500 MG tablet Take 500 mg by mouth daily.        Marland Kitchen aspirin 325 MG tablet Take 325 mg by mouth daily.        . folic acid (FOLVITE) 1 MG tablet Take 1 mg by mouth daily.        Marland Kitchen HYDROcodone-acetaminophen (VICODIN) 5-500 MG per tablet Take 1 tablet by mouth every 6 (six) hours as needed.        . metoprolol (TOPROL-XL) 50 MG 24 hr tablet Take 50 mg by mouth daily.        . Multiple Vitamin (MULTIVITAMIN) capsule Take 1 capsule by mouth daily.        . rosuvastatin (CRESTOR) 20 MG tablet Take 20 mg by mouth daily.        . Testosterone 30 MG/ACT SOLN Place onto the skin as directed.          PAST MEDICAL HISTORY:   Significant for hypercholesterolemia, coronary artery disease status post CABG under Evelene Croon in 1999.  History of GERD. History of degenerative disk disease, with MRI of the spine showing scoliosis and lumbar degenerative changes, most prominent on the left at L2-3 and L4-5.  There is a history of benign prostatic hypertrophy, anemia, possible childhood rheumatic fever, osteopenia  by DEXA scan, status post herniorrhaphy, and status post septoplasty.  FAMILY HISTORY:  The patient's father died at the age of 61 from heart problems.  The patient's mother died at the age of 93 from heart problems.  The patient has three brothers and two sisters.  The only cancer in the family known to the patient is the maternal grandmother, who had breast cancer diagnosed in her fifties or sixties.    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  He has been married to [place] for 30 years.  They have no children of their own. They have, however, multiple cats.  They attend [employer].  HEALTH MAINTENANCE:    He had a colonoscopy under Boyd Kerbs in 1999.  His cholesterol is "all right".  The patient is a nonsmoker and has never abused alcohol.  His PSA is low.  As already noted, he had a DEXA scan showing osteopenia.    Objective:  Filed Vitals:   07/05/11 1215  BP: 102/71  Pulse: 65  Temp: 97.7 F (36.5 C)    BMI: Body mass index is 21.40 kg/(m^2).   ECOG FS: 0  Physical Exam:  Sclerae unicteric  Oropharynx clear  No cervical, supraclavicular, axillary, or inguinal adenopathy  Lungs clear -- no rales or rhonchi  Heart regular rate and rhythm  Abdomen benign  MSK no focal spinal tenderness, no peripheral edema  Neuro nonfocal    Lab Results:      Chemistry      Component Value Date/Time   NA 140 07/02/2011 1123   NA 140 07/02/2011 1123   NA 140 07/02/2011 1123   K 4.3 07/02/2011 1123   K 4.3 07/02/2011 1123   K 4.3 07/02/2011 1123   CL 101 07/02/2011 1123   CL 101 07/02/2011 1123   CL 101 07/02/2011 1123   CO2 30 07/02/2011 1123   CO2 30 07/02/2011 1123   CO2 30 07/02/2011 1123   BUN 15 07/02/2011 1123   BUN 15 07/02/2011 1123   BUN 15 07/02/2011 1123   CREATININE 1.13 07/02/2011 1123   CREATININE 1.13 07/02/2011 1123   CREATININE 1.13 07/02/2011 1123      Component Value Date/Time   CALCIUM  8.9 07/02/2011 1123   CALCIUM 8.9 07/02/2011 1123   CALCIUM 8.9 07/02/2011 1123   ALKPHOS 85 07/02/2011 1123   ALKPHOS 85 07/02/2011 1123   ALKPHOS 85 07/02/2011 1123   AST 13 07/02/2011 1123   AST 13 07/02/2011 1123   AST 13 07/02/2011 1123   ALT <8 07/02/2011 1123   ALT <8 07/02/2011 1123   ALT <8 07/02/2011 1123   BILITOT 0.5 07/02/2011 1123   BILITOT 0.5 07/02/2011 1123   BILITOT 0.5 07/02/2011 1123       Lab Results  Component Value Date   WBC 5.6 07/02/2011   HGB 13.5 07/02/2011   HCT 39.3 07/02/2011   MCV 92.1 07/02/2011   PLT 288 07/02/2011   NEUTROABS 3.6 07/02/2011    Studies/Results:  No new results found.  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  Plan: There is no evidence of disease activity. I am going to continue to see Beldon every 6 months fairly indefinitely, but we will not up 10 scans unless they have specific symptoms to follow. He has a chronically dysthymic affect, but does not seem to be bothered by this, and has consistently refused antidepressants. I have encouraged him to keep his weight up and to call for any problems that may develop before the next visit  Cheryn Lundquist C 07/07/2011

## 2011-09-18 DIAGNOSIS — E291 Testicular hypofunction: Secondary | ICD-10-CM | POA: Diagnosis not present

## 2011-10-24 DIAGNOSIS — E291 Testicular hypofunction: Secondary | ICD-10-CM | POA: Diagnosis not present

## 2011-11-22 DIAGNOSIS — M545 Low back pain, unspecified: Secondary | ICD-10-CM | POA: Diagnosis not present

## 2011-11-27 DIAGNOSIS — E291 Testicular hypofunction: Secondary | ICD-10-CM | POA: Diagnosis not present

## 2011-11-28 DIAGNOSIS — H524 Presbyopia: Secondary | ICD-10-CM | POA: Diagnosis not present

## 2011-11-28 DIAGNOSIS — H259 Unspecified age-related cataract: Secondary | ICD-10-CM | POA: Diagnosis not present

## 2011-11-28 DIAGNOSIS — H5200 Hypermetropia, unspecified eye: Secondary | ICD-10-CM | POA: Diagnosis not present

## 2011-11-28 DIAGNOSIS — H52209 Unspecified astigmatism, unspecified eye: Secondary | ICD-10-CM | POA: Diagnosis not present

## 2011-12-07 DIAGNOSIS — IMO0002 Reserved for concepts with insufficient information to code with codable children: Secondary | ICD-10-CM | POA: Diagnosis not present

## 2011-12-07 DIAGNOSIS — M47817 Spondylosis without myelopathy or radiculopathy, lumbosacral region: Secondary | ICD-10-CM | POA: Diagnosis not present

## 2011-12-18 ENCOUNTER — Encounter: Payer: Self-pay | Admitting: Internal Medicine

## 2011-12-18 DIAGNOSIS — D649 Anemia, unspecified: Secondary | ICD-10-CM | POA: Diagnosis not present

## 2011-12-18 DIAGNOSIS — R5383 Other fatigue: Secondary | ICD-10-CM | POA: Diagnosis not present

## 2011-12-18 DIAGNOSIS — R5381 Other malaise: Secondary | ICD-10-CM | POA: Diagnosis not present

## 2011-12-18 DIAGNOSIS — E785 Hyperlipidemia, unspecified: Secondary | ICD-10-CM | POA: Diagnosis not present

## 2011-12-18 DIAGNOSIS — D510 Vitamin B12 deficiency anemia due to intrinsic factor deficiency: Secondary | ICD-10-CM | POA: Diagnosis not present

## 2011-12-18 DIAGNOSIS — I10 Essential (primary) hypertension: Secondary | ICD-10-CM | POA: Diagnosis not present

## 2011-12-25 DIAGNOSIS — S8010XA Contusion of unspecified lower leg, initial encounter: Secondary | ICD-10-CM | POA: Diagnosis not present

## 2011-12-25 DIAGNOSIS — Z23 Encounter for immunization: Secondary | ICD-10-CM | POA: Diagnosis not present

## 2011-12-26 ENCOUNTER — Other Ambulatory Visit (HOSPITAL_BASED_OUTPATIENT_CLINIC_OR_DEPARTMENT_OTHER): Payer: Medicare Other | Admitting: Lab

## 2011-12-26 DIAGNOSIS — C8589 Other specified types of non-Hodgkin lymphoma, extranodal and solid organ sites: Secondary | ICD-10-CM | POA: Diagnosis not present

## 2011-12-26 DIAGNOSIS — C8590 Non-Hodgkin lymphoma, unspecified, unspecified site: Secondary | ICD-10-CM

## 2011-12-26 LAB — CBC WITH DIFFERENTIAL/PLATELET
BASO%: 0.5 % (ref 0.0–2.0)
Basophils Absolute: 0 10*3/uL (ref 0.0–0.1)
EOS%: 0.9 % (ref 0.0–7.0)
Eosinophils Absolute: 0.1 10*3/uL (ref 0.0–0.5)
HCT: 39.3 % (ref 38.4–49.9)
HGB: 13.2 g/dL (ref 13.0–17.1)
LYMPH%: 18.6 % (ref 14.0–49.0)
MCH: 31.2 pg (ref 27.2–33.4)
MCHC: 33.6 g/dL (ref 32.0–36.0)
MCV: 93 fL (ref 79.3–98.0)
MONO#: 0.6 10*3/uL (ref 0.1–0.9)
MONO%: 8.9 % (ref 0.0–14.0)
NEUT#: 4.5 10*3/uL (ref 1.5–6.5)
NEUT%: 71.1 % (ref 39.0–75.0)
Platelets: 199 10*3/uL (ref 140–400)
RBC: 4.23 10*6/uL (ref 4.20–5.82)
RDW: 13 % (ref 11.0–14.6)
WBC: 6.4 10*3/uL (ref 4.0–10.3)
lymph#: 1.2 10*3/uL (ref 0.9–3.3)

## 2011-12-27 LAB — COMPREHENSIVE METABOLIC PANEL
ALT: 9 U/L (ref 0–53)
AST: 12 U/L (ref 0–37)
Albumin: 4 g/dL (ref 3.5–5.2)
Alkaline Phosphatase: 81 U/L (ref 39–117)
BUN: 14 mg/dL (ref 6–23)
CO2: 29 mEq/L (ref 19–32)
Calcium: 9 mg/dL (ref 8.4–10.5)
Chloride: 103 mEq/L (ref 96–112)
Creatinine, Ser: 1.03 mg/dL (ref 0.50–1.35)
Glucose, Bld: 89 mg/dL (ref 70–99)
Potassium: 4.4 mEq/L (ref 3.5–5.3)
Sodium: 139 mEq/L (ref 135–145)
Total Bilirubin: 0.9 mg/dL (ref 0.3–1.2)
Total Protein: 6.2 g/dL (ref 6.0–8.3)

## 2011-12-27 LAB — IGM: IgM, Serum: 93 mg/dL (ref 41–251)

## 2011-12-27 LAB — LACTATE DEHYDROGENASE: LDH: 120 U/L (ref 94–250)

## 2011-12-27 LAB — BETA 2 MICROGLOBULIN, SERUM: Beta-2 Microglobulin: 1.31 mg/L (ref 1.01–1.73)

## 2012-01-02 ENCOUNTER — Other Ambulatory Visit: Payer: Self-pay | Admitting: Oncology

## 2012-01-02 ENCOUNTER — Encounter: Payer: Self-pay | Admitting: Oncology

## 2012-01-02 ENCOUNTER — Ambulatory Visit (HOSPITAL_BASED_OUTPATIENT_CLINIC_OR_DEPARTMENT_OTHER): Payer: Medicare Other | Admitting: Oncology

## 2012-01-02 ENCOUNTER — Telehealth: Payer: Self-pay | Admitting: Oncology

## 2012-01-02 VITALS — BP 107/69 | HR 68 | Temp 97.7°F | Ht 69.0 in | Wt 142.9 lb

## 2012-01-02 DIAGNOSIS — Z87828 Personal history of other (healed) physical injury and trauma: Secondary | ICD-10-CM

## 2012-01-02 DIAGNOSIS — M419 Scoliosis, unspecified: Secondary | ICD-10-CM | POA: Insufficient documentation

## 2012-01-02 DIAGNOSIS — M4120 Other idiopathic scoliosis, site unspecified: Secondary | ICD-10-CM

## 2012-01-02 DIAGNOSIS — C8583 Other specified types of non-Hodgkin lymphoma, intra-abdominal lymph nodes: Secondary | ICD-10-CM

## 2012-01-02 DIAGNOSIS — C8290 Follicular lymphoma, unspecified, unspecified site: Secondary | ICD-10-CM

## 2012-01-02 DIAGNOSIS — N400 Enlarged prostate without lower urinary tract symptoms: Secondary | ICD-10-CM | POA: Insufficient documentation

## 2012-01-02 MED ORDER — GABAPENTIN 300 MG PO CAPS: 300.0000 mg | ORAL_CAPSULE | Freq: Every evening | ORAL | Status: DC | PRN

## 2012-01-02 NOTE — Telephone Encounter (Signed)
gve the pt his jan 2014 appt calendar °

## 2012-01-02 NOTE — Progress Notes (Signed)
ID: Willie Garcia   DOB: [birth year]  MR#: [id number]  WUJ#:[id number]  HISTORY OF PRESENT ILLNESS: Willie Garcia has long had back problems and has been followed for this by Dr. Danielle Dess.  On November 20, 2005, the patient had an MRI of the lumbar spine without contrast for evaluation of his chronic progressive low back and left buttock pain.  This showed confluent retroperitoneal adenopathy encasing the renal vessels and measuring up to 5.2 x 3.4 cm transversely. There was no epidural mass and the kidneys appeared unremarkable.  This had been compared with an MRI from Nov 09, 2004 where no such adenopathy was noted.    The patient's wife, [name], is a Customer service manager at [employer], so she brought this to the attention of Dr. Earlene Plater and a CT Scan of the chest, abdomen and pelvis was obtained there on June 11th.  It was ready by Kara Pacer, showing some small mediastinal lymph nodes, the largest being 1.4 cm, but significant adenopathy surrounding the left renal vein with anterior displacement of that vein as well as the inferior vena cava.  This measured 5.2 cm in transverse diameter and 3.8 cm in AP diameter.  The adenopathy extended inferiorly to the level of the aortic bifurcation.  There were small mesenteric lymph nodes, all less than 12 mm. The spleen was upper normal in size with no focal lesions.  There was no pelvic adenopathy.  His subsequent history is as detailed below  INTERVAL HISTORY: Willie Garcia returns today with his wife [name] for followup of his non-Hodgkin's lymphoma. The interval history is chiefly significant for is having dropped a fairly large piece of lumbar on his left ankle, with significant of bruising and nerve pain. Apparently he had plain films which showed no fracture.  REVIEW OF SYSTEMS: Otherwise a review of systems is stable. He continues to have problems from his scoliosis, but he and Dr. Danielle Dess had discussed surgery and the plan is for conservative followup only at least for now. He  keeps busy in his workshop, but is really not enjoying his retirement. He tells me his appetite is okay but things don't taste like anything and he really doesn't crave any food. Urinary stream is variable depending on his hydration status he tells me. Bowel movements are okay there have been no unusual headaches visual changes nausea or vomiting, no cough or phlegm production. There have been no drenching sweats, fevers, rash, or adenopathy that he is aware of. A detailed review of systems was otherwise unremarkable  PAST MEDICAL HISTORY: Past Medical History  Diagnosis Date  . Allergic rhinitis   . Non Hodgkin's lymphoma   . CAD (coronary artery disease)   . Depression   . GERD (gastroesophageal reflux disease)   . Hyperlipidemia   . HTN (hypertension)   . Osteopenia   . Anemia   . Anxiety   . Scoliosis   . BPH (benign prostatic hyperplasia)     PAST SURGICAL HISTORY: Past Surgical History  Procedure Date  . Coronary artery bypass graft   . Rhinoplasty   . Hernia repair     FAMILY HISTORY Family History  Problem Relation Age of Onset  . Colonic polyp    . Diabetes    . Heart disease    The patient's father died at the age of 62 from heart problems.  The patient's mother died at the age of 29 from heart problems.  The patient has three brothers and two sisters.  The only cancer in the  family known to the patient is the maternal grandmother, who had breast cancer diagnosed in her fifties or sixties.    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  He has been married to [place] for >30 years.  They have no children of their own. They attend [employer]   ADVANCED DIRECTIVES:  HEALTH MAINTENANCE: History  Substance Use Topics  . Smoking status: Never Smoker   . Smokeless tobacco: Not on file  . Alcohol Use: No     Colonoscopy:  PAP:  Bone density:  Lipid panel:  No Known Allergies  Current Outpatient Prescriptions    Medication Sig Dispense Refill  . Ascorbic Acid (VITAMIN C) 500 MG tablet Take 500 mg by mouth daily.        Marland Kitchen aspirin 325 MG tablet Take 325 mg by mouth daily.        Marland Kitchen doxycycline (VIBRAMYCIN) 100 MG capsule       . folic acid (FOLVITE) 1 MG tablet Take 1 mg by mouth daily.        Marland Kitchen HYDROcodone-acetaminophen (VICODIN) 5-500 MG per tablet Take 1 tablet by mouth every 6 (six) hours as needed.        . metoprolol (TOPROL-XL) 50 MG 24 hr tablet Take 50 mg by mouth daily.        . Multiple Vitamin (MULTIVITAMIN) capsule Take 1 capsule by mouth daily.        . rosuvastatin (CRESTOR) 20 MG tablet Take 20 mg by mouth daily.        . Testosterone 30 MG/ACT SOLN Place onto the skin as directed.        . gabapentin (NEURONTIN) 300 MG capsule Take 1 capsule (300 mg total) by mouth at bedtime as needed.  30 capsule  12    OBJECTIVE: Middle-aged white male with fairly flat affect Filed Vitals:   01/02/12 1101  BP: 107/69  Pulse: 68  Temp: 97.7 F (36.5 C)     Body mass index is 21.10 kg/(m^2).    ECOG FS: 1  Sclerae unicteric Oropharynx clear No cervical or supraclavicular adenopathy; no axillary or inguinal adenopathy Lungs no rales or rhonchi Heart regular rate and rhythm Abd benign MSK scoliosis but no focal spinal tenderness, no peripheral edema; bruising left ankle, with some tenderness to palpation Neuro: nonfocal  LAB RESULTS: IgM, LDH, and beta-2 microglobulin are all normal.  Lab Results  Component Value Date   WBC 6.4 12/26/2011   NEUTROABS 4.5 12/26/2011   HGB 13.2 12/26/2011   HCT 39.3 12/26/2011   MCV 93.0 12/26/2011   PLT 199 12/26/2011      Chemistry      Component Value Date/Time   NA 139 12/26/2011 1035   K 4.4 12/26/2011 1035   CL 103 12/26/2011 1035   CO2 29 12/26/2011 1035   BUN 14 12/26/2011 1035   CREATININE 1.03 12/26/2011 1035      Component Value Date/Time   CALCIUM 9.0 12/26/2011 1035   ALKPHOS 81 12/26/2011 1035   AST 12 12/26/2011 1035   ALT 9 12/26/2011  1035   BILITOT 0.9 12/26/2011 1035       No results found for this basename: LABCA2    No components found with this basename: LABCA125    No results found for this basename: INR:1;PROTIME:1 in the last 168 hours  Urinalysis No results found for this basename: colorurine,  appearanceur,  labspec,  phurine,  glucoseu,  hgbur,  bilirubinur,  ketonesur,  proteinur,  urobilinogen,  nitrite,  leukocytesur    STUDIES: No results found.   ASSESSMENT: 66 y.o. 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   PLAN: There is no evidence of lymphoma activity. He is doing terrific from my point of view. I think he might benefit from gabapentin at bedtime since the pain in his ankle from the recent trauma is worse around 4 in the morning. I have cautioned him regarding sleepiness during the day with this medication and you certainly should not use a tractor or any other equipment it if he takes the gabapentin during the day. He will see me again in 6 months. We will repeat lab work then. He knows to call for any problems that may develop before that visit.   Kaisley Stiverson C    01/02/2012

## 2012-01-03 DIAGNOSIS — L0231 Cutaneous abscess of buttock: Secondary | ICD-10-CM | POA: Diagnosis not present

## 2012-01-03 DIAGNOSIS — E291 Testicular hypofunction: Secondary | ICD-10-CM | POA: Diagnosis not present

## 2012-01-16 DIAGNOSIS — M949 Disorder of cartilage, unspecified: Secondary | ICD-10-CM | POA: Diagnosis not present

## 2012-01-16 DIAGNOSIS — M899 Disorder of bone, unspecified: Secondary | ICD-10-CM | POA: Diagnosis not present

## 2012-02-22 ENCOUNTER — Encounter: Payer: Self-pay | Admitting: Internal Medicine

## 2012-02-22 ENCOUNTER — Ambulatory Visit (INDEPENDENT_AMBULATORY_CARE_PROVIDER_SITE_OTHER): Payer: Medicare Other | Admitting: Internal Medicine

## 2012-02-22 VITALS — BP 107/67 | HR 58 | Ht 70.0 in | Wt 141.6 lb

## 2012-02-22 DIAGNOSIS — I10 Essential (primary) hypertension: Secondary | ICD-10-CM | POA: Diagnosis not present

## 2012-02-22 DIAGNOSIS — E785 Hyperlipidemia, unspecified: Secondary | ICD-10-CM

## 2012-02-22 DIAGNOSIS — I2510 Atherosclerotic heart disease of native coronary artery without angina pectoris: Secondary | ICD-10-CM

## 2012-02-22 NOTE — Patient Instructions (Signed)
Your physician wants you to follow-up in: 12 months with Dr. Taylor. You will receive a reminder letter in the mail two months in advance. If you don't receive a letter, please call our office to schedule the follow-up appointment.    

## 2012-02-27 ENCOUNTER — Encounter: Payer: Self-pay | Admitting: Internal Medicine

## 2012-02-27 NOTE — Assessment & Plan Note (Signed)
His blood pressure is well controlled. No change in medical therapy. 

## 2012-02-27 NOTE — Assessment & Plan Note (Signed)
He will continue Crestor 20 mg daily. A low-fat diet is recommended.

## 2012-02-27 NOTE — Assessment & Plan Note (Signed)
He denies anginal symptoms. He will continue his current medical therapy. 

## 2012-02-27 NOTE — Progress Notes (Signed)
HPI [name] returns today for followup. He is a very pleasant 66 year old man with a history of premature coronary disease status post bypass surgery, with preserved left ventricular function, history of lymphoma status post therapy, hypertension, and dyslipidemia. Today he complains of occasional episodes of weakness. These are not related to exertion. No chest pain, shortness of breath, or syncope. No Known Allergies   Current Outpatient Prescriptions  Medication Sig Dispense Refill  . Ascorbic Acid (VITAMIN C) 500 MG tablet Take 500 mg by mouth daily.        Marland Kitchen aspirin 325 MG tablet Take 325 mg by mouth daily.        Marland Kitchen doxycycline (VIBRAMYCIN) 100 MG capsule       . folic acid (FOLVITE) 1 MG tablet Take 1 mg by mouth daily.        Marland Kitchen gabapentin (NEURONTIN) 300 MG capsule Take 1 capsule (300 mg total) by mouth at bedtime as needed.  30 capsule  12  . HYDROcodone-acetaminophen (VICODIN) 5-500 MG per tablet Take 1 tablet by mouth every 6 (six) hours as needed.        . metoprolol (TOPROL-XL) 50 MG 24 hr tablet Take 50 mg by mouth daily.        . Multiple Vitamin (MULTIVITAMIN) capsule Take 1 capsule by mouth daily.        . rosuvastatin (CRESTOR) 20 MG tablet Take 20 mg by mouth daily.        . Testosterone 30 MG/ACT SOLN Place onto the skin as directed.           Past Medical History  Diagnosis Date  . Allergic rhinitis   . Non Hodgkin's lymphoma   . CAD (coronary artery disease)   . Depression   . GERD (gastroesophageal reflux disease)   . Hyperlipidemia   . HTN (hypertension)   . Osteopenia   . Anemia   . Anxiety   . Scoliosis   . BPH (benign prostatic hyperplasia)     ROS:   All systems reviewed and negative except as noted in the HPI.   Past Surgical History  Procedure Date  . Coronary artery bypass graft   . Rhinoplasty   . Hernia repair      Family History  Problem Relation Age of Onset  . Colonic polyp    . Diabetes    . Heart disease       History    Social History  . Marital Status: Married    Spouse Name: N/A    Number of Children: N/A  . Years of Education: N/A   Occupational History  . Not on file.   Social History Main Topics  . Smoking status: Never Smoker   . Smokeless tobacco: Not on file  . Alcohol Use: No  . Drug Use: No  . Sexually Active: Not on file   Other Topics Concern  . Not on file   Social History Narrative  . No narrative on file     BP 107/67  Pulse 58  Ht 5\' 10"  (1.778 m)  Wt 141 lb 9.6 oz (64.229 kg)  BMI 20.32 kg/m2  Physical Exam:  Well appearing middle-aged man, NAD HEENT: Unremarkable Neck:  No JVD, no thyromegally Lungs:  Clear with no wheezes, rales, or rhonchi. HEART:  Regular rate rhythm, no murmurs, no rubs, no clicks Abd:  soft, positive bowel sounds, no organomegally, no rebound, no guarding Ext:  2 plus pulses, no edema, no cyanosis, no clubbing Skin:  No rashes no nodules Neuro:  CN II through XII intact, motor grossly intact  EKG Sinus bradycardia with left atrial enlargement  Assess/Plan:

## 2012-02-28 DIAGNOSIS — E291 Testicular hypofunction: Secondary | ICD-10-CM | POA: Diagnosis not present

## 2012-02-28 DIAGNOSIS — D510 Vitamin B12 deficiency anemia due to intrinsic factor deficiency: Secondary | ICD-10-CM | POA: Diagnosis not present

## 2012-03-25 ENCOUNTER — Other Ambulatory Visit: Payer: Self-pay | Admitting: *Deleted

## 2012-03-27 DIAGNOSIS — Z23 Encounter for immunization: Secondary | ICD-10-CM | POA: Diagnosis not present

## 2012-05-07 DIAGNOSIS — E291 Testicular hypofunction: Secondary | ICD-10-CM | POA: Diagnosis not present

## 2012-06-23 DIAGNOSIS — M25519 Pain in unspecified shoulder: Secondary | ICD-10-CM | POA: Diagnosis not present

## 2012-06-23 DIAGNOSIS — M7530 Calcific tendinitis of unspecified shoulder: Secondary | ICD-10-CM | POA: Diagnosis not present

## 2012-06-30 ENCOUNTER — Other Ambulatory Visit (HOSPITAL_BASED_OUTPATIENT_CLINIC_OR_DEPARTMENT_OTHER): Payer: Medicare Other | Admitting: Lab

## 2012-06-30 DIAGNOSIS — C8299 Follicular lymphoma, unspecified, extranodal and solid organ sites: Secondary | ICD-10-CM

## 2012-06-30 DIAGNOSIS — C8290 Follicular lymphoma, unspecified, unspecified site: Secondary | ICD-10-CM

## 2012-06-30 LAB — CBC WITH DIFFERENTIAL/PLATELET
BASO%: 0.6 % (ref 0.0–2.0)
Basophils Absolute: 0.1 10*3/uL (ref 0.0–0.1)
EOS%: 0.9 % (ref 0.0–7.0)
Eosinophils Absolute: 0.1 10*3/uL (ref 0.0–0.5)
HCT: 38.2 % — ABNORMAL LOW (ref 38.4–49.9)
HGB: 13.2 g/dL (ref 13.0–17.1)
LYMPH%: 16.1 % (ref 14.0–49.0)
MCH: 31.4 pg (ref 27.2–33.4)
MCHC: 34.4 g/dL (ref 32.0–36.0)
MCV: 91.3 fL (ref 79.3–98.0)
MONO#: 0.7 10*3/uL (ref 0.1–0.9)
MONO%: 7.6 % (ref 0.0–14.0)
NEUT#: 6.8 10*3/uL — ABNORMAL HIGH (ref 1.5–6.5)
NEUT%: 74.8 % (ref 39.0–75.0)
Platelets: 234 10*3/uL (ref 140–400)
RBC: 4.19 10*6/uL — ABNORMAL LOW (ref 4.20–5.82)
RDW: 12.7 % (ref 11.0–14.6)
WBC: 9.1 10*3/uL (ref 4.0–10.3)
lymph#: 1.5 10*3/uL (ref 0.9–3.3)

## 2012-06-30 LAB — COMPREHENSIVE METABOLIC PANEL (CC13)
ALT: 12 U/L (ref 0–55)
AST: 18 U/L (ref 5–34)
Albumin: 3.5 g/dL (ref 3.5–5.0)
Alkaline Phosphatase: 88 U/L (ref 40–150)
BUN: 14 mg/dL (ref 7.0–26.0)
CO2: 30 mEq/L — ABNORMAL HIGH (ref 22–29)
Calcium: 9.4 mg/dL (ref 8.4–10.4)
Chloride: 101 mEq/L (ref 98–107)
Creatinine: 1.1 mg/dL (ref 0.7–1.3)
Glucose: 76 mg/dl (ref 70–99)
Potassium: 3.8 mEq/L (ref 3.5–5.1)
Sodium: 138 mEq/L (ref 136–145)
Total Bilirubin: 0.73 mg/dL (ref 0.20–1.20)
Total Protein: 7.3 g/dL (ref 6.4–8.3)

## 2012-06-30 LAB — LACTATE DEHYDROGENASE (CC13): LDH: 231 U/L (ref 125–245)

## 2012-07-01 LAB — BETA 2 MICROGLOBULIN, SERUM: Beta-2 Microglobulin: 1.47 mg/L (ref 1.01–1.73)

## 2012-07-01 LAB — IGM: IgM, Serum: 93 mg/dL (ref 41–251)

## 2012-07-07 ENCOUNTER — Ambulatory Visit (HOSPITAL_BASED_OUTPATIENT_CLINIC_OR_DEPARTMENT_OTHER): Payer: Medicare Other | Admitting: Oncology

## 2012-07-07 ENCOUNTER — Telehealth: Payer: Self-pay | Admitting: Oncology

## 2012-07-07 VITALS — BP 110/64 | HR 76 | Temp 97.6°F | Resp 20 | Ht 70.0 in | Wt 145.5 lb

## 2012-07-07 DIAGNOSIS — C8290 Follicular lymphoma, unspecified, unspecified site: Secondary | ICD-10-CM

## 2012-07-07 DIAGNOSIS — C8299 Follicular lymphoma, unspecified, extranodal and solid organ sites: Secondary | ICD-10-CM | POA: Diagnosis not present

## 2012-07-07 NOTE — Progress Notes (Signed)
ID: Willie Garcia   DOB: May 14, 1946  MR#: [id number]  WGN#:[id number]  HQI:ONGE,XBMWUXLKGM R, MD SU: OTHER MD: Barnett Abu  HISTORY OF PRESENT ILLNESS: Stone has long had back problems and has been followed for this by Dr. Danielle Dess.  On November 20, 2005, the patient had an MRI of the lumbar spine without contrast for evaluation of his chronic progressive low back and left buttock pain.  This showed confluent retroperitoneal adenopathy encasing the renal vessels and measuring up to 5.2 x 3.4 cm transversely. There was no epidural mass and the kidneys appeared unremarkable.  This had been compared with an MRI from Nov 09, 2004 where no such adenopathy was noted.    The patient's wife, Willie Garcia, is a Customer service manager at [employer], so she brought this to the attention of Dr. Earlene Plater and a CT Scan of the chest, abdomen and pelvis was obtained there on June 11th.  It was ready by Kara Pacer, showing some small mediastinal lymph nodes, the largest being 1.4 cm, but significant adenopathy surrounding the left renal vein with anterior displacement of that vein as well as the inferior vena cava.  This measured 5.2 cm in transverse diameter and 3.8 cm in AP diameter.  The adenopathy extended inferiorly to the level of the aortic bifurcation.  There were small mesenteric lymph nodes, all less than 12 mm. The spleen was upper normal in size with no focal lesions.  There was no pelvic adenopathy.  His subsequent history is as detailed below  INTERVAL HISTORY: Willie Garcia returns today for followup of his non-Hodgkin's lymphoma. His wife Willie Garcia was unable to accompany him today.  REVIEW OF SYSTEMS: He generally feels well. He has quite a bit of back pain and has been evaluated for this by Dr. Danielle Dess. The pain is fairly constant, worse with going from a standing to sitting position and by by [place] bursa, better with walking. He also hangs from his feet and ankles down to "open up the vertebrae". No surgery is planned at this  point. 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 He has had no fevers, drenching sweats, rash, adenopathy, or unexplained fatigue. His weight is stable. A detailed review of systems was otherwise noncontributory.  PAST MEDICAL HISTORY: Past Medical History  Diagnosis Date  . Allergic rhinitis   . Non Hodgkin's lymphoma   . CAD (coronary artery disease)   . Depression   . GERD (gastroesophageal reflux disease)   . Hyperlipidemia   . HTN (hypertension)   . Osteopenia   . Anemia   . Anxiety   . Scoliosis   . BPH (benign prostatic hyperplasia)     PAST SURGICAL HISTORY: Past Surgical History  Procedure Date  . Coronary artery bypass graft   . Rhinoplasty   . Hernia repair     FAMILY HISTORY Family History  Problem Relation Age of Onset  . Colonic polyp    . Diabetes    . Heart disease    The patient's father died at the age of 99 from heart problems.  The patient's mother died at the age of 45 from heart problems.  The patient has three brothers and two sisters.  The only cancer in the family known to the patient is the maternal grandmother, who had breast cancer diagnosed in her fifties or sixties.    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  He has been married to [place]  for >30 years.  They have no children of their own. They attend [employer]   ADVANCED DIRECTIVES:  HEALTH MAINTENANCE: History  Substance Use Topics  . Smoking status: Never Smoker   . Smokeless tobacco: Not on file  . Alcohol Use: No     Colonoscopy:  PAP:  Bone density:  Lipid panel:  No Known Allergies  Current Outpatient Prescriptions  Medication Sig Dispense Refill  . Ascorbic Acid (VITAMIN C) 500 MG tablet Take 500 mg by mouth daily.        Marland Kitchen aspirin 325 MG tablet Take 325 mg by mouth daily.        Marland Kitchen doxycycline (VIBRAMYCIN) 100 MG capsule       . folic acid (FOLVITE) 1 MG tablet  Take 1 mg by mouth daily.        Marland Kitchen gabapentin (NEURONTIN) 300 MG capsule Take 1 capsule (300 mg total) by mouth at bedtime as needed.  30 capsule  12  . HYDROcodone-acetaminophen (VICODIN) 5-500 MG per tablet Take 1 tablet by mouth every 6 (six) hours as needed.        . metoprolol (TOPROL-XL) 50 MG 24 hr tablet Take 50 mg by mouth daily.        . Multiple Vitamin (MULTIVITAMIN) capsule Take 1 capsule by mouth daily.        . rosuvastatin (CRESTOR) 20 MG tablet Take 20 mg by mouth daily.        . Testosterone 30 MG/ACT SOLN Place onto the skin as directed.          OBJECTIVE: Middle-aged white male in no acute distress Filed Vitals:   07/07/12 1328  BP: 110/64  Pulse: 76  Temp: 97.6 F (36.4 C)  Resp: 20     Body mass index is 20.88 kg/(m^2).    ECOG FS: 1  Sclerae unicteric Oropharynx clear No cervical or supraclavicular adenopathy; no axillary or inguinal adenopathy Lungs no rales or rhonchi Heart regular rate and rhythm Abd no splenomegaly; positive bowel sounds; nontender MSK scoliosis but no focal spinal tenderness, no peripheral edema Neuro: nonfocal, well oriented  LAB RESULTS: IgM, LDH, and beta-2 microglobulin are all normal.  Lab Results  Component Value Date   WBC 9.1 06/30/2012   NEUTROABS 6.8* 06/30/2012   HGB 13.2 06/30/2012   HCT 38.2* 06/30/2012   MCV 91.3 06/30/2012   PLT 234 06/30/2012      Chemistry      Component Value Date/Time   NA 138 06/30/2012 1325   NA 139 12/26/2011 1035   K 3.8 06/30/2012 1325   K 4.4 12/26/2011 1035   CL 101 06/30/2012 1325   CL 103 12/26/2011 1035   CO2 30* 06/30/2012 1325   CO2 29 12/26/2011 1035   BUN 14.0 06/30/2012 1325   BUN 14 12/26/2011 1035   CREATININE 1.1 06/30/2012 1325   CREATININE 1.03 12/26/2011 1035      Component Value Date/Time   CALCIUM 9.4 06/30/2012 1325   CALCIUM 9.0 12/26/2011 1035   ALKPHOS 88 06/30/2012 1325   ALKPHOS 81 12/26/2011 1035   AST 18 06/30/2012 1325   AST 12 12/26/2011 1035   ALT 12 06/30/2012  1325   ALT 9 12/26/2011 1035   BILITOT 0.73 06/30/2012 1325   BILITOT 0.9 12/26/2011 1035       No results found for this basename: LABCA2    No components found with this basename: [id number]    No results found for this  basename: INR:1;PROTIME:1 in the last 168 hours  Urinalysis No results found for this basename: colorurine,  appearanceur,  labspec,  phurine,  glucoseu,  hgbur,  bilirubinur,  ketonesur,  proteinur,  urobilinogen,  nitrite,  leukocytesur    STUDIES: No results found.  ASSESSMENT: 67 y.o. 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   PLAN: Dinari is doing well from my point of view. There is no evidence of disease activity. I am going to see him again in October, with lab work and a physical exam. Otherwise he knows to call for any problems that may develop before that visit.   MAGRINAT,GUSTAV C    07/07/2012

## 2012-07-07 NOTE — Telephone Encounter (Signed)
lmonvm adviisng the pt of his oct 2014 appts

## 2012-07-17 DIAGNOSIS — M2550 Pain in unspecified joint: Secondary | ICD-10-CM | POA: Diagnosis not present

## 2012-07-17 DIAGNOSIS — Z1331 Encounter for screening for depression: Secondary | ICD-10-CM | POA: Diagnosis not present

## 2012-07-17 DIAGNOSIS — I2510 Atherosclerotic heart disease of native coronary artery without angina pectoris: Secondary | ICD-10-CM | POA: Diagnosis not present

## 2012-07-17 DIAGNOSIS — D510 Vitamin B12 deficiency anemia due to intrinsic factor deficiency: Secondary | ICD-10-CM | POA: Diagnosis not present

## 2012-07-21 ENCOUNTER — Encounter: Payer: Self-pay | Admitting: Internal Medicine

## 2012-07-21 DIAGNOSIS — L219 Seborrheic dermatitis, unspecified: Secondary | ICD-10-CM | POA: Diagnosis not present

## 2012-07-21 DIAGNOSIS — E785 Hyperlipidemia, unspecified: Secondary | ICD-10-CM | POA: Diagnosis not present

## 2012-07-21 DIAGNOSIS — D649 Anemia, unspecified: Secondary | ICD-10-CM | POA: Diagnosis not present

## 2012-07-21 DIAGNOSIS — I10 Essential (primary) hypertension: Secondary | ICD-10-CM | POA: Diagnosis not present

## 2012-07-21 DIAGNOSIS — D510 Vitamin B12 deficiency anemia due to intrinsic factor deficiency: Secondary | ICD-10-CM | POA: Diagnosis not present

## 2012-08-15 DIAGNOSIS — R220 Localized swelling, mass and lump, head: Secondary | ICD-10-CM | POA: Diagnosis not present

## 2012-08-15 DIAGNOSIS — H905 Unspecified sensorineural hearing loss: Secondary | ICD-10-CM | POA: Diagnosis not present

## 2012-09-12 DIAGNOSIS — M542 Cervicalgia: Secondary | ICD-10-CM | POA: Diagnosis not present

## 2012-10-14 DIAGNOSIS — H905 Unspecified sensorineural hearing loss: Secondary | ICD-10-CM | POA: Diagnosis not present

## 2012-10-14 DIAGNOSIS — H9319 Tinnitus, unspecified ear: Secondary | ICD-10-CM | POA: Diagnosis not present

## 2012-10-16 ENCOUNTER — Telehealth: Payer: Self-pay | Admitting: Internal Medicine

## 2012-10-16 DIAGNOSIS — I10 Essential (primary) hypertension: Secondary | ICD-10-CM | POA: Diagnosis not present

## 2012-10-16 DIAGNOSIS — K5289 Other specified noninfective gastroenteritis and colitis: Secondary | ICD-10-CM | POA: Diagnosis not present

## 2012-10-16 DIAGNOSIS — R197 Diarrhea, unspecified: Secondary | ICD-10-CM | POA: Diagnosis not present

## 2012-10-16 DIAGNOSIS — K219 Gastro-esophageal reflux disease without esophagitis: Secondary | ICD-10-CM | POA: Diagnosis not present

## 2012-10-16 DIAGNOSIS — R112 Nausea with vomiting, unspecified: Secondary | ICD-10-CM | POA: Diagnosis not present

## 2012-10-16 DIAGNOSIS — R9431 Abnormal electrocardiogram [ECG] [EKG]: Secondary | ICD-10-CM | POA: Diagnosis not present

## 2012-10-16 DIAGNOSIS — I2510 Atherosclerotic heart disease of native coronary artery without angina pectoris: Secondary | ICD-10-CM | POA: Diagnosis not present

## 2012-10-16 NOTE — Telephone Encounter (Signed)
Spoke with patient's wife, he woke up in the night with diarrhea and vomiting.  She had some Phenergan and gave him one but does not know if he kept it down.  All has subsided now but he is just weak.  I explained to her I do not feel like this is related to his heart but coming from a GI bug.  He may need some fluids but to get rehydrated to help him feel better.  He does not want to go to the ER to get fluids.  She has place a call to his PCP and waiting for a return call now

## 2012-10-16 NOTE — Telephone Encounter (Signed)
Dr Ladona Ridgel reviewed and said non specific changes when compared to last  EKG

## 2012-10-16 NOTE — Telephone Encounter (Signed)
New Probme:    Called in wanting to know if you received a stat EKG form them this morning.  Please call back.

## 2012-10-16 NOTE — Telephone Encounter (Signed)
New problem   Would like to be seen today if possible.   C/O during the night n/v . Blood pressure 154/94 . Now 94/62. Pulse 80. pcp is not open up yet. H/o bypass in 79.  Wife gave 2 baby asa.

## 2012-11-19 DIAGNOSIS — M545 Low back pain, unspecified: Secondary | ICD-10-CM | POA: Diagnosis not present

## 2012-11-19 DIAGNOSIS — M4120 Other idiopathic scoliosis, site unspecified: Secondary | ICD-10-CM | POA: Diagnosis not present

## 2012-11-20 ENCOUNTER — Telehealth: Payer: Self-pay | Admitting: *Deleted

## 2012-11-20 NOTE — Telephone Encounter (Signed)
Message left by pt's wife- [name], stating concern due to noted 20 lb weight loss. [name] is unsure if pt needs to see Dr Thea Silversmith or his primary. Note pt is being administered testosterone shots by his wife at home.  Return call number for [name] given as work at [phone] or cell (post 5pm ) at [phone].

## 2012-11-27 ENCOUNTER — Encounter: Payer: Self-pay | Admitting: Internal Medicine

## 2012-11-27 ENCOUNTER — Ambulatory Visit (INDEPENDENT_AMBULATORY_CARE_PROVIDER_SITE_OTHER): Payer: Medicare Other | Admitting: Internal Medicine

## 2012-11-27 VITALS — BP 118/80 | HR 74 | Wt 143.0 lb

## 2012-11-27 DIAGNOSIS — E782 Mixed hyperlipidemia: Secondary | ICD-10-CM | POA: Diagnosis not present

## 2012-11-27 DIAGNOSIS — E785 Hyperlipidemia, unspecified: Secondary | ICD-10-CM | POA: Diagnosis not present

## 2012-11-27 DIAGNOSIS — I2510 Atherosclerotic heart disease of native coronary artery without angina pectoris: Secondary | ICD-10-CM

## 2012-11-27 DIAGNOSIS — I2589 Other forms of chronic ischemic heart disease: Secondary | ICD-10-CM | POA: Diagnosis not present

## 2012-11-27 LAB — LIPID PANEL
Cholesterol: 128 mg/dL (ref 0–200)
HDL: 44.3 mg/dL (ref >39.00)
LDL Cholesterol: 69 mg/dL (ref 0–99)
Total CHOL/HDL Ratio: 3
Triglycerides: 76 mg/dL (ref 0.0–149.0)
VLDL: 15.2 mg/dL (ref 0.0–40.0)

## 2012-11-27 LAB — BASIC METABOLIC PANEL
BUN: 17 mg/dL (ref 6–23)
CO2: 31 mEq/L (ref 19–32)
Calcium: 9.4 mg/dL (ref 8.4–10.5)
Chloride: 103 mEq/L (ref 96–112)
Creatinine, Ser: 0.9 mg/dL (ref 0.4–1.5)
GFR: 85.02 mL/min (ref >60.00)
Glucose, Bld: 81 mg/dL (ref 70–99)
Potassium: 4.1 mEq/L (ref 3.5–5.1)
Sodium: 138 mEq/L (ref 135–145)

## 2012-11-27 NOTE — Progress Notes (Signed)
HPI Mr. Delcarlo returns for followup. He is a pleasant 67 yo man with a h/o CAD, dyslipidemia, s/p CABG. In the interim he has done well. He denies chest pain or sob. No syncope. He thinks he has lost weight. He remains active. No Known Allergies   Current Outpatient Prescriptions  Medication Sig Dispense Refill  . Ascorbic Acid (VITAMIN C) 500 MG tablet Take 500 mg by mouth daily.        Marland Kitchen aspirin 325 MG tablet Take 325 mg by mouth daily.        Marland Kitchen ezetimibe (ZETIA) 10 MG tablet Take 10 mg by mouth daily.      . folic acid (FOLVITE) 1 MG tablet Take 1 mg by mouth daily.        Marland Kitchen HYDROcodone-acetaminophen (VICODIN) 5-500 MG per tablet Take 1 tablet by mouth every 6 (six) hours as needed.        . metoprolol (TOPROL-XL) 50 MG 24 hr tablet Take 50 mg by mouth daily.        . Multiple Vitamin (MULTIVITAMIN) capsule Take 1 capsule by mouth daily.        . rosuvastatin (CRESTOR) 20 MG tablet Take 20 mg by mouth daily.        Marland Kitchen testosterone cypionate (DEPOTESTOTERONE CYPIONATE) 200 MG/ML injection AS DIRECTED       No current facility-administered medications for this visit.     Past Medical History  Diagnosis Date  . Allergic rhinitis   . Non Hodgkin's lymphoma   . CAD (coronary artery disease)   . Depression   . GERD (gastroesophageal reflux disease)   . Hyperlipidemia   . HTN (hypertension)   . Osteopenia   . Anemia   . Anxiety   . Scoliosis   . BPH (benign prostatic hyperplasia)     ROS:   All systems reviewed and negative except as noted in the HPI.   Past Surgical History  Procedure Laterality Date  . Coronary artery bypass graft    . Rhinoplasty    . Hernia repair       Family History  Problem Relation Age of Onset  . Colonic polyp    . Diabetes    . Heart disease       History   Social History  . Marital Status: Married    Spouse Name: N/A    Number of Children: N/A  . Years of Education: N/A   Occupational History  . Not on file.   Social History  Main Topics  . Smoking status: Never Smoker   . Smokeless tobacco: Not on file  . Alcohol Use: No  . Drug Use: No  . Sexually Active: Not on file   Other Topics Concern  . Not on file   Social History Narrative  . No narrative on file     BP 118/80  Pulse 74  Wt 143 lb (64.864 kg)  BMI 20.52 kg/m2  Physical Exam:  Well appearing middle aged man,NAD HEENT: Unremarkable Neck:  no JVD, no thyromegally Lungs:  Clear with no wheezes HEART:  Regular rate rhythm, no murmurs, no rubs, no clicks Abd:  soft, positive bowel sounds, no organomegally, no rebound, no guarding Ext:  2 plus pulses, no edema, no cyanosis, no clubbing Skin:  No rashes no nodules Neuro:  CN II through XII intact, motor grossly intact  EKG - nsr   Assess/Plan:

## 2012-11-27 NOTE — Assessment & Plan Note (Addendum)
I have encouraged the patient to reduce his fat intake and continue his Crestor. Will check fasting lipids.

## 2012-11-27 NOTE — Assessment & Plan Note (Signed)
He has had no anginal symptoms. I have asked him to undergo exercise treadmill testing for screening over 14 years out from his CABG.

## 2012-11-27 NOTE — Patient Instructions (Addendum)
Your physician has requested that you have an exercise tolerance test in July (with GT/PA). For further information please visit [email or web address] Please also follow instruction sheet, as given.  Your physician recommends that you have lab work today: lipid/bmp  Your physician wants you to follow-up in: 1 year with Dr. Ladona Ridgel. You will receive a reminder letter in the mail two months in advance. If you don't receive a letter, please call our office to schedule the follow-up appointment.  Your physician recommends that you continue on your current medications as directed. Please refer to the Current Medication list given to you today.

## 2012-12-12 DIAGNOSIS — E291 Testicular hypofunction: Secondary | ICD-10-CM | POA: Diagnosis not present

## 2012-12-12 DIAGNOSIS — I10 Essential (primary) hypertension: Secondary | ICD-10-CM | POA: Diagnosis not present

## 2012-12-12 DIAGNOSIS — C8589 Other specified types of non-Hodgkin lymphoma, extranodal and solid organ sites: Secondary | ICD-10-CM | POA: Diagnosis not present

## 2012-12-12 DIAGNOSIS — K219 Gastro-esophageal reflux disease without esophagitis: Secondary | ICD-10-CM | POA: Diagnosis not present

## 2012-12-12 DIAGNOSIS — I2510 Atherosclerotic heart disease of native coronary artery without angina pectoris: Secondary | ICD-10-CM | POA: Diagnosis not present

## 2012-12-12 DIAGNOSIS — M5030 Other cervical disc degeneration, unspecified cervical region: Secondary | ICD-10-CM | POA: Diagnosis not present

## 2012-12-12 DIAGNOSIS — D510 Vitamin B12 deficiency anemia due to intrinsic factor deficiency: Secondary | ICD-10-CM | POA: Diagnosis not present

## 2012-12-12 DIAGNOSIS — F329 Major depressive disorder, single episode, unspecified: Secondary | ICD-10-CM | POA: Diagnosis not present

## 2012-12-17 DIAGNOSIS — E291 Testicular hypofunction: Secondary | ICD-10-CM | POA: Diagnosis not present

## 2012-12-17 DIAGNOSIS — N401 Enlarged prostate with lower urinary tract symptoms: Secondary | ICD-10-CM | POA: Diagnosis not present

## 2012-12-18 ENCOUNTER — Other Ambulatory Visit: Payer: Self-pay | Admitting: *Deleted

## 2012-12-18 DIAGNOSIS — D649 Anemia, unspecified: Secondary | ICD-10-CM | POA: Diagnosis not present

## 2012-12-18 DIAGNOSIS — E291 Testicular hypofunction: Secondary | ICD-10-CM | POA: Diagnosis not present

## 2012-12-18 DIAGNOSIS — R5381 Other malaise: Secondary | ICD-10-CM | POA: Diagnosis not present

## 2012-12-18 NOTE — Progress Notes (Signed)
[name] - pt's wife- called to schedule an appointment with MD and was transferred to this RN. Per discussion, [name] states pt was seen by primary MD ( Dr Hyman Hopes ) who did a CXR and then was seen by Dr Annabell Howells due to routine follow up.  Dr Annabell Howells stated due to pt's weight lose concerns best to see Dr Darnelle Catalan.  This RN place request for appointment.

## 2012-12-22 ENCOUNTER — Telehealth: Payer: Self-pay | Admitting: Oncology

## 2012-12-26 DIAGNOSIS — E291 Testicular hypofunction: Secondary | ICD-10-CM | POA: Diagnosis not present

## 2012-12-31 ENCOUNTER — Ambulatory Visit (INDEPENDENT_AMBULATORY_CARE_PROVIDER_SITE_OTHER): Payer: Medicare Other | Admitting: Internal Medicine

## 2012-12-31 DIAGNOSIS — I2589 Other forms of chronic ischemic heart disease: Secondary | ICD-10-CM | POA: Diagnosis not present

## 2012-12-31 DIAGNOSIS — I2510 Atherosclerotic heart disease of native coronary artery without angina pectoris: Secondary | ICD-10-CM

## 2012-12-31 NOTE — Progress Notes (Signed)
Exercise Treadmill Test  Pre-Exercise Testing Evaluation Rhythm: normal sinus  Rate: 69     Test  Exercise Tolerance Test Ordering MD: Lewayne Bunting, MD  Interpreting MD: Lewayne Bunting, MD  Unique Test No: 1  Treadmill:  1  Indication for ETT: known ASHD  Contraindication to ETT: No   Stress Modality: exercise - treadmill  Cardiac Imaging Performed: non   Protocol: standard Bruce - maximal  Max BP:  201/87  Max MPHR (bpm):  153 85% MPR (bpm):  130  MPHR obtained (bpm):  171 % MPHR obtained:  112  Reached 85% MPHR (min:sec):  4:45 Total Exercise Time (min-sec):  12:00  Workload in METS:  13.4 Borg Scale: 15  Reason ETT Terminated:  desired heart rate attained    ST Segment Analysis At Rest: normal ST segments - no evidence of significant ST depression With Exercise: borderline ST changes  Other Information Arrhythmia:  No Angina during ETT:  absent (0) Quality of ETT:  non-diagnostic  ETT Interpretation:  borderline (indeterminate) with non-specific ST changes  Comments: Clinically negative. Electrically non-diagnostic with non-specific STT changes resolved spontaneously  Recommendations: Continue current medications.

## 2013-01-05 ENCOUNTER — Telehealth: Payer: Self-pay | Admitting: Oncology

## 2013-01-05 ENCOUNTER — Encounter: Payer: Medicare Other | Admitting: Physician Assistant

## 2013-01-05 ENCOUNTER — Ambulatory Visit (HOSPITAL_BASED_OUTPATIENT_CLINIC_OR_DEPARTMENT_OTHER): Payer: Medicare Other | Admitting: Oncology

## 2013-01-05 VITALS — BP 113/64 | HR 63 | Temp 97.5°F | Resp 20 | Ht 70.0 in | Wt 145.9 lb

## 2013-01-05 DIAGNOSIS — E538 Deficiency of other specified B group vitamins: Secondary | ICD-10-CM

## 2013-01-05 DIAGNOSIS — C8290 Follicular lymphoma, unspecified, unspecified site: Secondary | ICD-10-CM

## 2013-01-05 DIAGNOSIS — C8299 Follicular lymphoma, unspecified, extranodal and solid organ sites: Secondary | ICD-10-CM

## 2013-01-05 NOTE — Progress Notes (Signed)
ID: [name]   DOB: [birth year]  MR#: [id number]  NWG#:[id number]  VHQ:IONG,[id number] R, MD SU: OTHER MD: Barnett Abu, Bjorn Pippin,   HISTORY OF PRESENT ILLNESS: Willie Garcia has long had back problems and has been followed for this by Dr. Danielle Dess.  On November 20, 2005, the patient had an MRI of the lumbar spine without contrast for evaluation of his chronic progressive low back and left buttock pain.  This showed confluent retroperitoneal adenopathy encasing the renal vessels and measuring up to 5.2 x 3.4 cm transversely. There was no epidural mass and the kidneys appeared unremarkable.  This had been compared with an MRI from Nov 09, 2004 where no such adenopathy was noted.    The patient's wife, Willie Garcia, is a Customer service manager at [employer], so she brought this to the attention of Dr. Earlene Plater and a CT Scan of the chest, abdomen and pelvis was obtained there on June 11th.  It was ready by Willie Garcia, showing some small mediastinal lymph nodes, the largest being 1.4 cm, but significant adenopathy surrounding the left renal vein with anterior displacement of that vein as well as the inferior vena cava.  This measured 5.2 cm in transverse diameter and 3.8 cm in AP diameter.  The adenopathy extended inferiorly to the level of the aortic bifurcation.  There were small mesenteric lymph nodes, all less than 12 mm. The spleen was upper normal in size with no focal lesions.  There was no pelvic adenopathy.  His subsequent history is as detailed below  INTERVAL HISTORY: Willie Garcia returns today for followup of his non-Hodgkin's lymphoma accompanied by his wife Willie Garcia. 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 He tells me he just had a stress test where he came through "with flying colors". More recently he saw Dr. Hoyle Sauer who was concerned because of the patient's weight loss. He felt he needed to be reevaluated by oncology.  REVIEW OF SYSTEMS: Describes  himself as mildly fatigued. He gets up usually around 7:30 in the morning, works in the farm all day, takes a nap in the late afternoon, and then states up until one or 2 in the morning. He has some back pain which is being evaluated by Dr. Danielle Dess. Otherwise a detailed review of systems today was noncontributory and in particular he denies fevers, drenching sweats, rash, or any adenopathy.   PAST MEDICAL HISTORY: Past Medical History  Diagnosis Date  . Allergic rhinitis   . Non Hodgkin's lymphoma   . CAD (coronary artery disease)   . Depression   . GERD (gastroesophageal reflux disease)   . Hyperlipidemia   . HTN (hypertension)   . Osteopenia   . Anemia   . Anxiety   . Scoliosis   . BPH (benign prostatic hyperplasia)     PAST SURGICAL HISTORY: Past Surgical History  Procedure Laterality Date  . Coronary artery bypass graft    . Rhinoplasty    . Hernia repair      FAMILY HISTORY Family History  Problem Relation Age of Onset  . Colonic polyp    . Diabetes    . Heart disease    The patient's father died at the age of 66 from heart problems.  The patient's mother died at the age of 60 from heart problems.  The patient has three brothers and two sisters.  The only cancer in the family known to the patient is the maternal grandmother, who had breast  cancer diagnosed in her fifties or sixties.    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  He has been married to [place] for >30 years.  They have no children of their own. They attend [employer]   ADVANCED DIRECTIVES:  HEALTH MAINTENANCE: History  Substance Use Topics  . Smoking status: Never Smoker   . Smokeless tobacco: Not on file  . Alcohol Use: No     Colonoscopy:  PAP:  Bone density:  Lipid panel:  No Known Allergies  Current Outpatient Prescriptions  Medication Sig Dispense Refill  . Ascorbic Acid (VITAMIN C) 500 MG tablet Take 500 mg by mouth daily.        Marland Kitchen  aspirin 325 MG tablet Take 325 mg by mouth daily.        Marland Kitchen ezetimibe (ZETIA) 10 MG tablet Take 10 mg by mouth daily.      . folic acid (FOLVITE) 1 MG tablet Take 1 mg by mouth daily.        Marland Kitchen HYDROcodone-acetaminophen (VICODIN) 5-500 MG per tablet Take 1 tablet by mouth every 6 (six) hours as needed.        . metoprolol (TOPROL-XL) 50 MG 24 hr tablet Take 50 mg by mouth daily.        . Multiple Vitamin (MULTIVITAMIN) capsule Take 1 capsule by mouth daily.        . rosuvastatin (CRESTOR) 20 MG tablet Take 20 mg by mouth daily.        Marland Kitchen testosterone cypionate (DEPOTESTOTERONE CYPIONATE) 200 MG/ML injection AS DIRECTED       No current facility-administered medications for this visit.    OBJECTIVE: Middle-aged white male in no acute distress Filed Vitals:   01/05/13 1619  BP: 113/64  Pulse: 63  Temp: 97.5 F (36.4 C)  Resp: 20     Body mass index is 20.93 kg/(m^2).    ECOG FS: 1  Sclerae unicteric Oropharynx clear No cervical or supraclavicular adenopathy; no axillary adenopathy Lungs no rales or rhonchi Heart regular rate and rhythm Abd no splenomegaly; positive bowel sounds; nontender MSK scoliosis but no focal spinal tenderness, no peripheral edema Neuro: nonfocal, well oriented, depressed affect  LAB RESULTS:   Lab Results  Component Value Date   WBC 9.1 06/30/2012   NEUTROABS 6.8* 06/30/2012   HGB 13.2 06/30/2012   HCT 38.2* 06/30/2012   MCV 91.3 06/30/2012   PLT 234 06/30/2012      Chemistry      Component Value Date/Time   NA 138 11/27/2012 1136   NA 138 06/30/2012 1325   K 4.1 11/27/2012 1136   K 3.8 06/30/2012 1325   CL 103 11/27/2012 1136   CL 101 06/30/2012 1325   CO2 31 11/27/2012 1136   CO2 30* 06/30/2012 1325   BUN 17 11/27/2012 1136   BUN 14.0 06/30/2012 1325   CREATININE 0.9 11/27/2012 1136   CREATININE 1.1 06/30/2012 1325      Component Value Date/Time   CALCIUM 9.4 11/27/2012 1136   CALCIUM 9.4 06/30/2012 1325   ALKPHOS 88 06/30/2012 1325   ALKPHOS 81  12/26/2011 1035   AST 18 06/30/2012 1325   AST 12 12/26/2011 1035   ALT 12 06/30/2012 1325   ALT 9 12/26/2011 1035   BILITOT 0.73 06/30/2012 1325   BILITOT 0.9 12/26/2011 1035       No results found for this basename: LABCA2    No components found with this basename: [id number]  No results found for this basename: INR,  in the last 168 hours  Urinalysis No results found for this basename: colorurine,  appearanceur,  labspec,  phurine,  glucoseu,  hgbur,  bilirubinur,  ketonesur,  proteinur,  urobilinogen,  nitrite,  leukocytesur    STUDIES: No results found. .  ASSESSMENT: 67 y.o. 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   PLAN: By review of systems in physical exam Willie Garcia is very stable. His weight has been stable for the past 18 months: Vitals - 1 value per visit 01/05/2013 11/27/2012 07/07/2012 02/22/2012 01/02/2012  Weight (lb) 145.9 143 145.5 141.6 142.9   Vitals - 1 value per visit 07/05/2011 10/09/2010 01/18/2010 12/22/2009 01/18/2009  Weight (lb) 144.9  155 154 160   Vitals - 1 value per visit 09/07/2008  Weight (lb) 156   Reviewing labs obtained by Dr. Danielle Dess, his white cell count was 4.9, lymphocyte count 1.6, granulocytes 2.5, hemoglobin slightly low at 11.8 with an MCV of 87.7, and a normal platelet count of 173.  I am not sure why he is mildly anemic, but I will add some anemia labs to his routine labs, which are currently due. 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 He will pick up the latter together with a lab results later this week.  He had an appointment to see me in October,  but we are changing that the next January. He knows to call for any problems that may develop before that visit. Karli Wickizer C    01/05/2013

## 2013-01-05 NOTE — Telephone Encounter (Signed)
gv pt appt schedule for July 2014 and January 2015.

## 2013-01-09 ENCOUNTER — Other Ambulatory Visit (HOSPITAL_BASED_OUTPATIENT_CLINIC_OR_DEPARTMENT_OTHER): Payer: Medicare Other

## 2013-01-09 DIAGNOSIS — E538 Deficiency of other specified B group vitamins: Secondary | ICD-10-CM | POA: Diagnosis not present

## 2013-01-09 DIAGNOSIS — C8589 Other specified types of non-Hodgkin lymphoma, extranodal and solid organ sites: Secondary | ICD-10-CM | POA: Diagnosis not present

## 2013-01-09 DIAGNOSIS — C8290 Follicular lymphoma, unspecified, unspecified site: Secondary | ICD-10-CM

## 2013-01-09 LAB — CBC & DIFF AND RETIC
BASO%: 0.4 % (ref 0.0–2.0)
Basophils Absolute: 0 10*3/uL (ref 0.0–0.1)
EOS%: 2.2 % (ref 0.0–7.0)
Eosinophils Absolute: 0.1 10*3/uL (ref 0.0–0.5)
HCT: 40.4 % (ref 38.4–49.9)
HGB: 13.4 g/dL (ref 13.0–17.1)
Immature Retic Fract: 4 % (ref 3.00–10.60)
LYMPH%: 29 % (ref 14.0–49.0)
MCH: 30.4 pg (ref 27.2–33.4)
MCHC: 33.2 g/dL (ref 32.0–36.0)
MCV: 91.6 fL (ref 79.3–98.0)
MONO#: 0.4 10*3/uL (ref 0.1–0.9)
MONO%: 9.6 % (ref 0.0–14.0)
NEUT#: 2.7 10*3/uL (ref 1.5–6.5)
NEUT%: 58.8 % (ref 39.0–75.0)
Platelets: 187 10*3/uL (ref 140–400)
RBC: 4.41 10*6/uL (ref 4.20–5.82)
RDW: 12.7 % (ref 11.0–14.6)
Retic %: 0.83 % (ref 0.80–1.80)
Retic Ct Abs: 36.6 10*3/uL (ref 34.80–93.90)
WBC: 4.6 10*3/uL (ref 4.0–10.3)
lymph#: 1.3 10*3/uL (ref 0.9–3.3)

## 2013-01-09 LAB — COMPREHENSIVE METABOLIC PANEL (CC13)
ALT: 13 U/L (ref 0–55)
AST: 16 U/L (ref 5–34)
Albumin: 3.8 g/dL (ref 3.5–5.0)
Alkaline Phosphatase: 82 U/L (ref 40–150)
BUN: 21.6 mg/dL (ref 7.0–26.0)
CO2: 27 mEq/L (ref 22–29)
Calcium: 9.3 mg/dL (ref 8.4–10.4)
Chloride: 104 mEq/L (ref 98–109)
Creatinine: 1.1 mg/dL (ref 0.7–1.3)
Glucose: 91 mg/dl (ref 70–140)
Potassium: 4.4 mEq/L (ref 3.5–5.1)
Sodium: 141 mEq/L (ref 136–145)
Total Bilirubin: 0.94 mg/dL (ref 0.20–1.20)
Total Protein: 6.8 g/dL (ref 6.4–8.3)

## 2013-01-09 LAB — FERRITIN CHCC: Ferritin: 15 ng/ml — ABNORMAL LOW (ref 22–316)

## 2013-01-09 LAB — CHCC SMEAR

## 2013-01-09 LAB — LACTATE DEHYDROGENASE (CC13): LDH: 132 U/L (ref 125–245)

## 2013-01-12 LAB — SEDIMENTATION RATE: Sed Rate: 4 mm/hr (ref 0–16)

## 2013-01-12 LAB — VITAMIN B12: Vitamin B-12: 490 pg/mL (ref 211–911)

## 2013-01-12 LAB — ANA: Anti Nuclear Antibody(ANA): NEGATIVE

## 2013-01-12 LAB — FOLATE: Folate: >20 ng/mL

## 2013-01-12 LAB — IGM: IgM, Serum: 81 mg/dL (ref 41–251)

## 2013-01-14 ENCOUNTER — Other Ambulatory Visit (HOSPITAL_COMMUNITY): Payer: Self-pay | Admitting: Oncology

## 2013-01-21 ENCOUNTER — Telehealth: Payer: Self-pay | Admitting: Emergency Medicine

## 2013-01-21 ENCOUNTER — Other Ambulatory Visit: Payer: Self-pay | Admitting: Emergency Medicine

## 2013-01-21 NOTE — Telephone Encounter (Signed)
Patient's spouse called to inquire about a future iron infusion.  Per Mrs Rideaux she received a letter from Dr Darnelle Catalan stating that Mr Nazaire needed to receive an iron infusion.  No new POF or orders noted to indicate such. Will clarify with Dr Darnelle Catalan and notify patient's spouse with further directions. Mrs Morais verbalized understanding.

## 2013-01-22 ENCOUNTER — Other Ambulatory Visit: Payer: Self-pay | Admitting: *Deleted

## 2013-01-27 ENCOUNTER — Telehealth: Payer: Self-pay | Admitting: *Deleted

## 2013-01-27 NOTE — Telephone Encounter (Signed)
Per staff message and POF I have scheduled appts.  JMW  

## 2013-01-28 ENCOUNTER — Telehealth: Payer: Self-pay | Admitting: *Deleted

## 2013-01-28 NOTE — Telephone Encounter (Signed)
Patient's wife called and left message that they need his appts. I have called the patient and gave him the appts.  JMW

## 2013-01-30 ENCOUNTER — Other Ambulatory Visit: Payer: Self-pay | Admitting: *Deleted

## 2013-01-30 ENCOUNTER — Ambulatory Visit (HOSPITAL_BASED_OUTPATIENT_CLINIC_OR_DEPARTMENT_OTHER): Payer: Medicare Other

## 2013-01-30 VITALS — BP 111/69 | HR 61 | Temp 97.7°F | Resp 18

## 2013-01-30 DIAGNOSIS — D649 Anemia, unspecified: Secondary | ICD-10-CM | POA: Diagnosis not present

## 2013-01-30 MED ORDER — FERUMOXYTOL INJECTION 510 MG/17 ML
510.0000 mg | Freq: Once | INTRAVENOUS | Status: AC
  Administered 2013-01-30: 510 mg via INTRAVENOUS
  Filled 2013-01-30: qty 17

## 2013-01-30 MED ORDER — SODIUM CHLORIDE 0.9 % IV SOLN
Freq: Once | INTRAVENOUS | Status: AC
  Administered 2013-01-30: 13:00:00 via INTRAVENOUS

## 2013-01-30 NOTE — Patient Instructions (Addendum)
Ferumoxytol injection What is this medicine? FERUMOXYTOL is an iron complex. Iron is used to make healthy red blood cells, which carry oxygen and nutrients throughout the body. This medicine is used to treat iron deficiency anemia in people with chronic kidney disease. This medicine may be used for other purposes; ask your health care provider or pharmacist if you have questions. What should I tell my health care provider before I take this medicine? They need to know if you have any of these conditions: -anemia not caused by low iron levels -high levels of iron in the blood -magnetic resonance imaging (MRI) test scheduled -an unusual or allergic reaction to iron, other medicines, foods, dyes, or preservatives -pregnant or trying to get pregnant -breast-feeding How should I use this medicine? This medicine is for infusion into a vein. It is given by a health care professional in a hospital or clinic setting. Talk to your pediatrician regarding the use of this medicine in children. Special care may be needed. Overdosage: If you think you've taken too much of this medicine contact a poison control center or emergency room at once. Overdosage: If you think you have taken too much of this medicine contact a poison control center or emergency room at once. NOTE: This medicine is only for you. Do not share this medicine with others. What if I miss a dose? It is important not to miss your dose. Call your doctor or health care professional if you are unable to keep an appointment. What may interact with this medicine? This medicine may interact with the following medications: -other iron products This list may not describe all possible interactions. Give your health care provider a list of all the medicines, herbs, non-prescription drugs, or dietary supplements you use. Also tell them if you smoke, drink alcohol, or use illegal drugs. Some items may interact with your medicine. What should I watch  for while using this medicine? Visit your doctor or healthcare professional regularly. Tell your doctor or healthcare professional if your symptoms do not start to get better or if they get worse. You may need blood work done while you are taking this medicine. You may need to follow a special diet. Talk to your doctor. Foods that contain iron include: whole grains/cereals, dried fruits, beans, or peas, leafy green vegetables, and organ meats (liver, kidney). What side effects may I notice from receiving this medicine? Side effects that you should report to your doctor or health care professional as soon as possible: -allergic reactions like skin rash, itching or hives, swelling of the face, lips, or tongue -breathing problems -changes in blood pressure -feeling faint or lightheaded, falls -fever or chills -flushing, sweating, or hot feelings -swelling of the ankles or feet Side effects that usually do not require medical attention (Report these to your doctor or health care professional if they continue or are bothersome.): -diarrhea -headache -nausea, vomiting -stomach pain This list may not describe all possible side effects. Call your doctor for medical advice about side effects. You may report side effects to FDA at 1-800-FDA-1088. Where should I keep my medicine? This drug is given in a hospital or clinic and will not be stored at home. NOTE: This sheet is a summary. It may not cover all possible information. If you have questions about this medicine, talk to your doctor, pharmacist, or health care provider.  2013, Elsevier/Gold Standard. (02/25/2008 9:48:25 PM)  

## 2013-02-01 ENCOUNTER — Other Ambulatory Visit: Payer: Self-pay | Admitting: Oncology

## 2013-02-02 DIAGNOSIS — L219 Seborrheic dermatitis, unspecified: Secondary | ICD-10-CM | POA: Diagnosis not present

## 2013-02-02 DIAGNOSIS — L738 Other specified follicular disorders: Secondary | ICD-10-CM | POA: Diagnosis not present

## 2013-02-02 DIAGNOSIS — B079 Viral wart, unspecified: Secondary | ICD-10-CM | POA: Diagnosis not present

## 2013-02-02 DIAGNOSIS — D1801 Hemangioma of skin and subcutaneous tissue: Secondary | ICD-10-CM | POA: Diagnosis not present

## 2013-02-02 DIAGNOSIS — L570 Actinic keratosis: Secondary | ICD-10-CM | POA: Diagnosis not present

## 2013-02-06 ENCOUNTER — Ambulatory Visit (HOSPITAL_BASED_OUTPATIENT_CLINIC_OR_DEPARTMENT_OTHER): Payer: Medicare Other

## 2013-02-06 VITALS — BP 106/60 | HR 52 | Temp 97.8°F

## 2013-02-06 DIAGNOSIS — D649 Anemia, unspecified: Secondary | ICD-10-CM

## 2013-02-06 DIAGNOSIS — D509 Iron deficiency anemia, unspecified: Secondary | ICD-10-CM

## 2013-02-06 DIAGNOSIS — C8290 Follicular lymphoma, unspecified, unspecified site: Secondary | ICD-10-CM

## 2013-02-06 MED ORDER — SODIUM CHLORIDE 0.9 % IV SOLN
1020.0000 mg | Freq: Once | INTRAVENOUS | Status: AC
  Administered 2013-02-06: 1020 mg via INTRAVENOUS
  Filled 2013-02-06: qty 34

## 2013-02-06 MED ORDER — SODIUM CHLORIDE 0.9 % IV SOLN
Freq: Once | INTRAVENOUS | Status: AC
  Administered 2013-02-06: 14:00:00 via INTRAVENOUS

## 2013-02-06 NOTE — Patient Instructions (Addendum)
Ferumoxytol injection What is this medicine? FERUMOXYTOL is an iron complex. Iron is used to make healthy red blood cells, which carry oxygen and nutrients throughout the body. This medicine is used to treat iron deficiency anemia in people with chronic kidney disease. This medicine may be used for other purposes; ask your health care provider or pharmacist if you have questions. What should I tell my health care provider before I take this medicine? They need to know if you have any of these conditions: -anemia not caused by low iron levels -high levels of iron in the blood -magnetic resonance imaging (MRI) test scheduled -an unusual or allergic reaction to iron, other medicines, foods, dyes, or preservatives -pregnant or trying to get pregnant -breast-feeding How should I use this medicine? This medicine is for infusion into a vein. It is given by a health care professional in a hospital or clinic setting. Talk to your pediatrician regarding the use of this medicine in children. Special care may be needed. Overdosage: If you think you've taken too much of this medicine contact a poison control center or emergency room at once. Overdosage: If you think you have taken too much of this medicine contact a poison control center or emergency room at once. NOTE: This medicine is only for you. Do not share this medicine with others. What if I miss a dose? It is important not to miss your dose. Call your doctor or health care professional if you are unable to keep an appointment. What may interact with this medicine? This medicine may interact with the following medications: -other iron products This list may not describe all possible interactions. Give your health care provider a list of all the medicines, herbs, non-prescription drugs, or dietary supplements you use. Also tell them if you smoke, drink alcohol, or use illegal drugs. Some items may interact with your medicine. What should I watch  for while using this medicine? Visit your doctor or healthcare professional regularly. Tell your doctor or healthcare professional if your symptoms do not start to get better or if they get worse. You may need blood work done while you are taking this medicine. You may need to follow a special diet. Talk to your doctor. Foods that contain iron include: whole grains/cereals, dried fruits, beans, or peas, leafy green vegetables, and organ meats (liver, kidney). What side effects may I notice from receiving this medicine? Side effects that you should report to your doctor or health care professional as soon as possible: -allergic reactions like skin rash, itching or hives, swelling of the face, lips, or tongue -breathing problems -changes in blood pressure -feeling faint or lightheaded, falls -fever or chills -flushing, sweating, or hot feelings -swelling of the ankles or feet Side effects that usually do not require medical attention (Report these to your doctor or health care professional if they continue or are bothersome.): -diarrhea -headache -nausea, vomiting -stomach pain This list may not describe all possible side effects. Call your doctor for medical advice about side effects. You may report side effects to FDA at 1-800-FDA-1088. Where should I keep my medicine? This drug is given in a hospital or clinic and will not be stored at home. NOTE: This sheet is a summary. It may not cover all possible information. If you have questions about this medicine, talk to your doctor, pharmacist, or health care provider.  2013, Elsevier/Gold Standard. (02/25/2008 9:48:25 PM)  

## 2013-02-13 ENCOUNTER — Other Ambulatory Visit: Payer: Self-pay | Admitting: *Deleted

## 2013-02-13 DIAGNOSIS — D649 Anemia, unspecified: Secondary | ICD-10-CM

## 2013-02-17 ENCOUNTER — Telehealth: Payer: Self-pay | Admitting: Oncology

## 2013-02-17 NOTE — Telephone Encounter (Signed)
, °

## 2013-02-20 ENCOUNTER — Encounter: Payer: Self-pay | Admitting: Urology

## 2013-03-12 ENCOUNTER — Other Ambulatory Visit (HOSPITAL_BASED_OUTPATIENT_CLINIC_OR_DEPARTMENT_OTHER): Payer: Medicare Other

## 2013-03-12 DIAGNOSIS — D649 Anemia, unspecified: Secondary | ICD-10-CM

## 2013-03-12 LAB — CBC WITH DIFFERENTIAL/PLATELET
BASO%: 1.1 % (ref 0.0–2.0)
Basophils Absolute: 0 10*3/uL (ref 0.0–0.1)
EOS%: 2.1 % (ref 0.0–7.0)
Eosinophils Absolute: 0.1 10*3/uL (ref 0.0–0.5)
HCT: 38.7 % (ref 38.4–49.9)
HGB: 13.4 g/dL (ref 13.0–17.1)
LYMPH%: 28.5 % (ref 14.0–49.0)
MCH: 31.1 pg (ref 27.2–33.4)
MCHC: 34.7 g/dL (ref 32.0–36.0)
MCV: 89.6 fL (ref 79.3–98.0)
MONO#: 0.5 10*3/uL (ref 0.1–0.9)
MONO%: 11 % (ref 0.0–14.0)
NEUT#: 2.6 10*3/uL (ref 1.5–6.5)
NEUT%: 57.3 % (ref 39.0–75.0)
Platelets: 161 10*3/uL (ref 140–400)
RBC: 4.32 10*6/uL (ref 4.20–5.82)
RDW: 13.7 % (ref 11.0–14.6)
WBC: 4.6 10*3/uL (ref 4.0–10.3)
lymph#: 1.3 10*3/uL (ref 0.9–3.3)

## 2013-03-12 LAB — FERRITIN CHCC: Ferritin: 610 ng/mL — ABNORMAL HIGH (ref 22–316)

## 2013-03-27 ENCOUNTER — Other Ambulatory Visit: Payer: Self-pay | Admitting: *Deleted

## 2013-03-27 DIAGNOSIS — D649 Anemia, unspecified: Secondary | ICD-10-CM

## 2013-03-30 ENCOUNTER — Telehealth: Payer: Self-pay | Admitting: Internal Medicine

## 2013-03-30 NOTE — Telephone Encounter (Signed)
New problem    Renewal pilot license - medical .   Will be faxing over form

## 2013-03-31 ENCOUNTER — Other Ambulatory Visit: Payer: Medicare Other | Admitting: Lab

## 2013-03-31 ENCOUNTER — Telehealth: Payer: Self-pay | Admitting: Oncology

## 2013-03-31 NOTE — Telephone Encounter (Signed)
, °

## 2013-04-02 DIAGNOSIS — Z23 Encounter for immunization: Secondary | ICD-10-CM | POA: Diagnosis not present

## 2013-04-03 NOTE — Telephone Encounter (Signed)
Left wife a message that we have not received the forms

## 2013-04-07 ENCOUNTER — Other Ambulatory Visit (INDEPENDENT_AMBULATORY_CARE_PROVIDER_SITE_OTHER): Payer: Medicare Other

## 2013-04-07 ENCOUNTER — Ambulatory Visit: Payer: Medicare Other | Admitting: Oncology

## 2013-04-07 ENCOUNTER — Encounter: Payer: Self-pay | Admitting: Internal Medicine

## 2013-04-07 ENCOUNTER — Other Ambulatory Visit: Payer: Self-pay | Admitting: *Deleted

## 2013-04-07 DIAGNOSIS — E785 Hyperlipidemia, unspecified: Secondary | ICD-10-CM

## 2013-04-07 DIAGNOSIS — D649 Anemia, unspecified: Secondary | ICD-10-CM

## 2013-04-07 LAB — LIPID PANEL
Cholesterol: 120 mg/dL (ref 0–200)
HDL: 47.5 mg/dL (ref >39.00)
LDL Cholesterol: 60 mg/dL (ref 0–99)
Total CHOL/HDL Ratio: 3
Triglycerides: 62 mg/dL (ref 0.0–149.0)
VLDL: 12.4 mg/dL (ref 0.0–40.0)

## 2013-04-07 LAB — HEPATIC FUNCTION PANEL
ALT: 15 U/L (ref 0–53)
AST: 20 U/L (ref 0–37)
Albumin: 4 g/dL (ref 3.5–5.2)
Alkaline Phosphatase: 69 U/L (ref 39–117)
Bilirubin, Direct: 0.1 mg/dL (ref 0.0–0.3)
Total Bilirubin: 1 mg/dL (ref 0.3–1.2)
Total Protein: 6.7 g/dL (ref 6.0–8.3)

## 2013-04-07 NOTE — Telephone Encounter (Signed)
Left message for wife forms are ready

## 2013-04-08 ENCOUNTER — Other Ambulatory Visit: Payer: Self-pay | Admitting: Physician Assistant

## 2013-04-08 DIAGNOSIS — D649 Anemia, unspecified: Secondary | ICD-10-CM

## 2013-04-08 DIAGNOSIS — C8290 Follicular lymphoma, unspecified, unspecified site: Secondary | ICD-10-CM

## 2013-04-08 NOTE — Telephone Encounter (Signed)
Will mail to patient.

## 2013-04-09 ENCOUNTER — Other Ambulatory Visit (HOSPITAL_BASED_OUTPATIENT_CLINIC_OR_DEPARTMENT_OTHER): Payer: Medicare Other | Admitting: Lab

## 2013-04-09 DIAGNOSIS — C8290 Follicular lymphoma, unspecified, unspecified site: Secondary | ICD-10-CM

## 2013-04-09 DIAGNOSIS — D649 Anemia, unspecified: Secondary | ICD-10-CM | POA: Diagnosis not present

## 2013-04-09 DIAGNOSIS — C8299 Follicular lymphoma, unspecified, extranodal and solid organ sites: Secondary | ICD-10-CM | POA: Diagnosis not present

## 2013-04-09 LAB — CBC WITH DIFFERENTIAL/PLATELET
BASO%: 0.6 % (ref 0.0–2.0)
Basophils Absolute: 0 10*3/uL (ref 0.0–0.1)
EOS%: 1.3 % (ref 0.0–7.0)
Eosinophils Absolute: 0.1 10*3/uL (ref 0.0–0.5)
HCT: 38.3 % — ABNORMAL LOW (ref 38.4–49.9)
HGB: 12.9 g/dL — ABNORMAL LOW (ref 13.0–17.1)
LYMPH%: 24.2 % (ref 14.0–49.0)
MCH: 30.9 pg (ref 27.2–33.4)
MCHC: 33.7 g/dL (ref 32.0–36.0)
MCV: 91.6 fL (ref 79.3–98.0)
MONO#: 0.6 10*3/uL (ref 0.1–0.9)
MONO%: 8.5 % (ref 0.0–14.0)
NEUT#: 4.7 10*3/uL (ref 1.5–6.5)
NEUT%: 65.4 % (ref 39.0–75.0)
Platelets: 199 10*3/uL (ref 140–400)
RBC: 4.18 10*6/uL — ABNORMAL LOW (ref 4.20–5.82)
RDW: 13.9 % (ref 11.0–14.6)
WBC: 7.2 10*3/uL (ref 4.0–10.3)
lymph#: 1.7 10*3/uL (ref 0.9–3.3)

## 2013-04-09 LAB — FERRITIN CHCC: Ferritin: 495 ng/ml — ABNORMAL HIGH (ref 22–316)

## 2013-05-04 DIAGNOSIS — R059 Cough, unspecified: Secondary | ICD-10-CM | POA: Diagnosis not present

## 2013-05-04 DIAGNOSIS — J301 Allergic rhinitis due to pollen: Secondary | ICD-10-CM | POA: Diagnosis not present

## 2013-05-04 DIAGNOSIS — J321 Chronic frontal sinusitis: Secondary | ICD-10-CM | POA: Diagnosis not present

## 2013-05-04 DIAGNOSIS — IMO0002 Reserved for concepts with insufficient information to code with codable children: Secondary | ICD-10-CM | POA: Diagnosis not present

## 2013-05-04 DIAGNOSIS — R05 Cough: Secondary | ICD-10-CM | POA: Diagnosis not present

## 2013-05-04 DIAGNOSIS — C8589 Other specified types of non-Hodgkin lymphoma, extranodal and solid organ sites: Secondary | ICD-10-CM | POA: Diagnosis not present

## 2013-05-04 DIAGNOSIS — I10 Essential (primary) hypertension: Secondary | ICD-10-CM | POA: Diagnosis not present

## 2013-05-19 ENCOUNTER — Ambulatory Visit (INDEPENDENT_AMBULATORY_CARE_PROVIDER_SITE_OTHER): Payer: Medicare Other | Admitting: Internal Medicine

## 2013-05-19 ENCOUNTER — Encounter: Payer: Self-pay | Admitting: Internal Medicine

## 2013-05-19 ENCOUNTER — Other Ambulatory Visit (INDEPENDENT_AMBULATORY_CARE_PROVIDER_SITE_OTHER): Payer: Medicare Other

## 2013-05-19 VITALS — BP 100/60 | HR 68 | Ht 70.0 in | Wt 142.2 lb

## 2013-05-19 DIAGNOSIS — R1013 Epigastric pain: Secondary | ICD-10-CM | POA: Insufficient documentation

## 2013-05-19 DIAGNOSIS — R634 Abnormal weight loss: Secondary | ICD-10-CM

## 2013-05-19 DIAGNOSIS — E538 Deficiency of other specified B group vitamins: Secondary | ICD-10-CM | POA: Diagnosis not present

## 2013-05-19 DIAGNOSIS — D509 Iron deficiency anemia, unspecified: Secondary | ICD-10-CM

## 2013-05-19 DIAGNOSIS — K3189 Other diseases of stomach and duodenum: Secondary | ICD-10-CM | POA: Diagnosis not present

## 2013-05-19 LAB — IGA: IgA: 155 mg/dL (ref 68–378)

## 2013-05-19 NOTE — Assessment & Plan Note (Signed)
Iron deficiency and B12 deficiency raise question of malabsorption, could this be celiac disease. Will test. May need EGD as stated above. He also looks like he is at least mildly depressed and could have functional GI disturbance in association with that.

## 2013-05-19 NOTE — Assessment & Plan Note (Signed)
Wt Readings from Last 3 Encounters:  05/19/13 142 lb 3.2 oz (64.501 kg)  01/05/13 145 lb 14.4 oz (66.18 kg)  11/27/12 143 lb (64.864 kg)   not much if any weight loss by our scales though his wife insists he has been losing. He is definitely thin.

## 2013-05-19 NOTE — Patient Instructions (Signed)
Your physician has requested that you go to the basement for the following lab work before leaving today: TTG, IGA  Today you have been given Stool cards to take home and complete.  It is the time of year to have a vaccination to prevent the flu (influenza virus).  Please have this done through your primary care provider or you can get this done at local pharmacies or the [facility]. It would be very helpful if you notify your primary care provider when and where you had the vaccination given by messaging them in My Chart, leaving a message or faxing the information.   I appreciate the opportunity to care for you.

## 2013-05-19 NOTE — Assessment & Plan Note (Signed)
Raises further question of malabsorption. Await celiac testing.

## 2013-05-19 NOTE — Assessment & Plan Note (Signed)
In conjunction with his constitutional functional GI symptoms, it makes me wonder about celiac disease though will screening with tissue transglutaminase antibody and IgA level. He may need an EGD and colonoscopy. He had those in the past relatively recently but with a new iron Deficiency anemia though it might be indicated again. Will have him do guaiac Hemoccults as well.

## 2013-05-19 NOTE — Progress Notes (Signed)
Subjective:    Patient ID: Willie Garcia, male    DOB: 1946/04/17, 67 y.o.   MRN: [id number]  HPI The patient is here with his wife, this is some weight loss. He also has some bloating. Recently he was discovered to be iron deficient, he has seen Dr. Daneil Dolin that, and has received iron infusions with good response. He was also found to be B12 deficient in the last year or so. He does not donate blood, it sounds like he does eat iron. He has not noticed any bleeding or change in bowel habits other than some mild bloating and constipation. His appetite is off. He feels like his digestion is not quite right, he feels uncomfortable after eating at times. He has a fairly severe scoliosis and wonders if that might not be causing some of these problems by displacing abdominal organs. He has had some mild depressive symptoms as well, anhedonia. 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  No Known Allergies Outpatient Prescriptions Prior to Visit  Medication Sig Dispense Refill  . Ascorbic Acid (VITAMIN C) 500 MG tablet Take 500 mg by mouth daily.        Marland Kitchen aspirin 325 MG tablet Take 325 mg by mouth daily.        . cholecalciferol (VITAMIN D) 400 UNITS TABS Take by mouth.      . Cyanocobalamin (VITAMIN B-12 IJ) Inject as directed every 30 (thirty) days.      Marland Kitchen ezetimibe (ZETIA) 10 MG tablet Take 10 mg by mouth daily.      . fluticasone (FLONASE) 50 MCG/ACT nasal spray Place 2 sprays into the nose daily.      . folic acid (FOLVITE) 1 MG tablet Take 1 mg by mouth daily.        . metoprolol (TOPROL-XL) 50 MG 24 hr tablet Take 50 mg by mouth daily.        . Multiple Vitamin (MULTIVITAMIN) capsule Take 1 capsule by mouth daily.        . rosuvastatin (CRESTOR) 20 MG tablet Take 20 mg by mouth daily.        Marland Kitchen testosterone cypionate (DEPOTESTOTERONE CYPIONATE) 200 MG/ML injection AS DIRECTED       No  facility-administered medications prior to visit.   Past Medical History  Diagnosis Date  . Allergic rhinitis   . Non Hodgkin's lymphoma   . CAD (coronary artery disease)   . Depression   . GERD (gastroesophageal reflux disease)   . Hyperlipidemia   . HTN (hypertension)   . Osteopenia   . Anemia   . Anxiety   . Scoliosis   . BPH (benign prostatic hyperplasia)    Past Surgical History  Procedure Laterality Date  . Coronary artery bypass graft    . Rhinoplasty    . Hernia repair    . Colonoscopy    . Upper gastrointestinal endoscopy     History   Social History  . Marital Status: Married    Spouse Name: N/A    Number of Children: N/A  . Years of Education: N/A   Occupational History  . Retired    Social History Main Topics  . Smoking status: Never Smoker   . Smokeless tobacco: Never Used  . Alcohol Use: No  . Drug Use: No  . Sexual Activity: None   Other Topics Concern  . None  Social History Narrative   Married, wife is a Engineer, civil (consulting), no children   Retired Engineer, agricultural, flies as a hobby   2 caffeinated beverages daily   Family History  Problem Relation Age of Onset  . Colonic polyp    . Diabetes    . Heart disease      Review of Systems Chronic back pain, fatigue. All other review of systems as per history of present illness.    Objective:   Physical Exam General:  NAD - thin, mild muscle wasting Eyes:   anicteric Lungs:  clear Heart:  S1S2 no rubs, murmurs or gallops Abdomen:  soft and nontender, BS+, no hepatosplenomegaly or mass Ext:   no edema Neuro: Alert and oriented x3 Psych:  Flat affect but appropriately interactive it seems    Data Reviewed:  Lab Results  Component Value Date   WBC 7.2 04/09/2013   HGB 12.9* 04/09/2013   HCT 38.3* 04/09/2013   MCV 91.6 04/09/2013   PLT 199 04/09/2013   Pathology notes, labs    Assessment & Plan:   1. Iron deficiency anemia, unspecified   2. Loss of weight   3. B12 deficiency   4.   Dyspepsia   Call results to wife at work [phone]  I appreciate the opportunity to care for this patient.  CC: Hoyle Sauer, MD and Ruthann Cancer, MD

## 2013-05-20 LAB — TISSUE TRANSGLUTAMINASE, IGA: Tissue Transglutaminase Ab, IgA: 4.3 U/mL (ref <20)

## 2013-05-20 NOTE — Progress Notes (Signed)
Quick Note:  Does not have celiac disease. Await hemoccults  ______

## 2013-05-27 ENCOUNTER — Encounter: Payer: Self-pay | Admitting: Internal Medicine

## 2013-06-03 ENCOUNTER — Telehealth: Payer: Self-pay | Admitting: Internal Medicine

## 2013-06-03 NOTE — Telephone Encounter (Signed)
I have left a message that they have not resulted yet and we will call with the results as soon as they are available.

## 2013-06-08 ENCOUNTER — Other Ambulatory Visit (INDEPENDENT_AMBULATORY_CARE_PROVIDER_SITE_OTHER): Payer: Medicare Other

## 2013-06-08 DIAGNOSIS — R634 Abnormal weight loss: Secondary | ICD-10-CM

## 2013-06-08 DIAGNOSIS — D509 Iron deficiency anemia, unspecified: Secondary | ICD-10-CM | POA: Diagnosis not present

## 2013-06-08 LAB — HEMOCCULT SLIDES (X 3 CARDS)
Fecal Occult Blood: NEGATIVE
OCCULT 1: NEGATIVE
OCCULT 2: NEGATIVE
OCCULT 3: NEGATIVE
OCCULT 4: NEGATIVE
OCCULT 5: NEGATIVE

## 2013-06-09 ENCOUNTER — Telehealth: Payer: Self-pay | Admitting: Internal Medicine

## 2013-06-09 DIAGNOSIS — R634 Abnormal weight loss: Secondary | ICD-10-CM

## 2013-06-09 DIAGNOSIS — R109 Unspecified abdominal pain: Secondary | ICD-10-CM

## 2013-06-09 NOTE — Progress Notes (Signed)
Quick Note:  Please call wife on her cell (She is an Charity fundraiser at [employer]) Hemoccults negative So far all good news I understand from Dr. Darrall Dears last note that Dareion had a CT chest/abd/pelvis at [facility] last June (2014). Please get me a copy of that to review - will call once I do ______

## 2013-06-09 NOTE — Telephone Encounter (Signed)
Needs CT abd/pelvis re: abdominal pain, weight loss and history of lymphoma With contrast Do BUN/creat if needed

## 2013-06-09 NOTE — Telephone Encounter (Signed)
Scheduled CT abdomen, pelvis on 06/16/13 at 9:30 AM. Contrast 2 and 1 hours prior. NPO 4 hours prior. Needs BUN and Creat prior. Left a message for patient's wife to call me.

## 2013-06-09 NOTE — Telephone Encounter (Signed)
Spoke with patient's wife and he did not have a CT in 2014. He had last CT in 2011 that was chest only.

## 2013-06-10 ENCOUNTER — Other Ambulatory Visit (INDEPENDENT_AMBULATORY_CARE_PROVIDER_SITE_OTHER): Payer: Medicare Other

## 2013-06-10 DIAGNOSIS — R634 Abnormal weight loss: Secondary | ICD-10-CM | POA: Diagnosis not present

## 2013-06-10 DIAGNOSIS — R109 Unspecified abdominal pain: Secondary | ICD-10-CM | POA: Diagnosis not present

## 2013-06-10 LAB — BUN: BUN: 10 mg/dL (ref 6–23)

## 2013-06-10 LAB — CREATININE, SERUM: Creatinine, Ser: 1 mg/dL (ref 0.4–1.5)

## 2013-06-10 NOTE — Telephone Encounter (Signed)
Spoke with patient's wife and gave her appointment date, time and instructions. Patient will come for lab today or Friday. Contrast up front for pick up.

## 2013-06-16 ENCOUNTER — Ambulatory Visit (INDEPENDENT_AMBULATORY_CARE_PROVIDER_SITE_OTHER)
Admission: RE | Admit: 2013-06-16 | Discharge: 2013-06-16 | Disposition: A | Discharge status: Home / Self Care | Payer: Medicare Other | Source: Ambulatory Visit | Attending: Internal Medicine | Admitting: Internal Medicine

## 2013-06-16 DIAGNOSIS — R109 Unspecified abdominal pain: Secondary | ICD-10-CM

## 2013-06-16 DIAGNOSIS — R634 Abnormal weight loss: Secondary | ICD-10-CM | POA: Diagnosis not present

## 2013-06-16 MED ORDER — IOHEXOL 300 MG/ML  SOLN
80.0000 mL | Freq: Once | INTRAMUSCULAR | Status: AC | PRN
  Administered 2013-06-16: 80 mL via INTRAVENOUS

## 2013-06-16 NOTE — Progress Notes (Signed)
Quick Note:  Call wife with results No problems really found but ? Of appendicitis is raised - seems unlikely and maybe CT artifact I do not recall RLQ pain - is he having any?   ______

## 2013-06-17 DIAGNOSIS — D649 Anemia, unspecified: Secondary | ICD-10-CM | POA: Diagnosis not present

## 2013-06-17 DIAGNOSIS — N138 Other obstructive and reflux uropathy: Secondary | ICD-10-CM | POA: Diagnosis not present

## 2013-06-17 DIAGNOSIS — N139 Obstructive and reflux uropathy, unspecified: Secondary | ICD-10-CM | POA: Diagnosis not present

## 2013-06-17 DIAGNOSIS — E291 Testicular hypofunction: Secondary | ICD-10-CM | POA: Diagnosis not present

## 2013-06-17 DIAGNOSIS — N401 Enlarged prostate with lower urinary tract symptoms: Secondary | ICD-10-CM | POA: Diagnosis not present

## 2013-06-18 DIAGNOSIS — C4491 Basal cell carcinoma of skin, unspecified: Secondary | ICD-10-CM

## 2013-06-18 HISTORY — PX: COLONOSCOPY: SHX174

## 2013-06-18 HISTORY — DX: Basal cell carcinoma of skin, unspecified: C44.91

## 2013-06-19 NOTE — Progress Notes (Signed)
Quick Note:  Explain to wife (she asked to be called and he is ok) that we should schedule EGD and colonoscopy ([place] ok) to evaluate iron deficiency anemia. OK to schedule REV if desired to discuss before we do though had mentioned might be necessary ______

## 2013-06-23 ENCOUNTER — Ambulatory Visit (AMBULATORY_SURGERY_CENTER): Payer: Self-pay

## 2013-06-23 VITALS — Ht 70.0 in | Wt 140.0 lb

## 2013-06-23 DIAGNOSIS — D509 Iron deficiency anemia, unspecified: Secondary | ICD-10-CM

## 2013-06-23 MED ORDER — SUPREP BOWEL PREP KIT 17.5-3.13-1.6 GM/177ML PO SOLN: 1.0000 | Freq: Once | ORAL | Status: DC

## 2013-06-24 DIAGNOSIS — D649 Anemia, unspecified: Secondary | ICD-10-CM | POA: Diagnosis not present

## 2013-06-24 DIAGNOSIS — E291 Testicular hypofunction: Secondary | ICD-10-CM | POA: Diagnosis not present

## 2013-06-30 ENCOUNTER — Other Ambulatory Visit: Payer: Self-pay | Admitting: Physician Assistant

## 2013-06-30 ENCOUNTER — Other Ambulatory Visit (HOSPITAL_BASED_OUTPATIENT_CLINIC_OR_DEPARTMENT_OTHER): Payer: Medicare Other

## 2013-06-30 DIAGNOSIS — D649 Anemia, unspecified: Secondary | ICD-10-CM

## 2013-06-30 DIAGNOSIS — D509 Iron deficiency anemia, unspecified: Secondary | ICD-10-CM

## 2013-06-30 DIAGNOSIS — C8299 Follicular lymphoma, unspecified, extranodal and solid organ sites: Secondary | ICD-10-CM | POA: Diagnosis not present

## 2013-06-30 DIAGNOSIS — C8290 Follicular lymphoma, unspecified, unspecified site: Secondary | ICD-10-CM

## 2013-06-30 LAB — CBC WITH DIFFERENTIAL/PLATELET
BASO%: 1.4 % (ref 0.0–2.0)
Basophils Absolute: 0.1 10*3/uL (ref 0.0–0.1)
EOS%: 2.2 % (ref 0.0–7.0)
Eosinophils Absolute: 0.1 10*3/uL (ref 0.0–0.5)
HCT: 41.5 % (ref 38.4–49.9)
HGB: 14 g/dL (ref 13.0–17.1)
LYMPH%: 30.4 % (ref 14.0–49.0)
MCH: 32.5 pg (ref 27.2–33.4)
MCHC: 33.8 g/dL (ref 32.0–36.0)
MCV: 96.1 fL (ref 79.3–98.0)
MONO#: 0.5 10*3/uL (ref 0.1–0.9)
MONO%: 10.3 % (ref 0.0–14.0)
NEUT#: 2.7 10*3/uL (ref 1.5–6.5)
NEUT%: 55.7 % (ref 39.0–75.0)
Platelets: 193 10*3/uL (ref 140–400)
RBC: 4.32 10*6/uL (ref 4.20–5.82)
RDW: 12.5 % (ref 11.0–14.6)
WBC: 4.9 10*3/uL (ref 4.0–10.3)
lymph#: 1.5 10*3/uL (ref 0.9–3.3)

## 2013-06-30 LAB — COMPREHENSIVE METABOLIC PANEL (CC13)
ALT: 14 U/L (ref 0–55)
AST: 16 U/L (ref 5–34)
Albumin: 4.1 g/dL (ref 3.5–5.0)
Alkaline Phosphatase: 95 U/L (ref 40–150)
Anion Gap: 10 mEq/L (ref 3–11)
BUN: 14 mg/dL (ref 7.0–26.0)
CO2: 27 mEq/L (ref 22–29)
Calcium: 9.3 mg/dL (ref 8.4–10.4)
Chloride: 104 mEq/L (ref 98–109)
Creatinine: 0.9 mg/dL (ref 0.7–1.3)
Glucose: 88 mg/dl (ref 70–140)
Potassium: 4.2 mEq/L (ref 3.5–5.1)
Sodium: 141 mEq/L (ref 136–145)
Total Bilirubin: 0.81 mg/dL (ref 0.20–1.20)
Total Protein: 6.9 g/dL (ref 6.4–8.3)

## 2013-06-30 LAB — FERRITIN CHCC: Ferritin: 346 ng/ml — ABNORMAL HIGH (ref 22–316)

## 2013-07-02 ENCOUNTER — Encounter: Payer: Self-pay | Admitting: Internal Medicine

## 2013-07-02 ENCOUNTER — Ambulatory Visit (AMBULATORY_SURGERY_CENTER): Payer: Medicare Other | Admitting: Internal Medicine

## 2013-07-02 VITALS — BP 122/80 | HR 64 | Temp 97.4°F | Resp 17 | Ht 70.0 in | Wt 140.0 lb

## 2013-07-02 DIAGNOSIS — M4120 Other idiopathic scoliosis, site unspecified: Secondary | ICD-10-CM | POA: Diagnosis not present

## 2013-07-02 DIAGNOSIS — N400 Enlarged prostate without lower urinary tract symptoms: Secondary | ICD-10-CM | POA: Diagnosis not present

## 2013-07-02 DIAGNOSIS — Z951 Presence of aortocoronary bypass graft: Secondary | ICD-10-CM | POA: Diagnosis not present

## 2013-07-02 DIAGNOSIS — R1314 Dysphagia, pharyngoesophageal phase: Secondary | ICD-10-CM

## 2013-07-02 DIAGNOSIS — R1319 Other dysphagia: Secondary | ICD-10-CM

## 2013-07-02 DIAGNOSIS — D509 Iron deficiency anemia, unspecified: Secondary | ICD-10-CM | POA: Diagnosis not present

## 2013-07-02 DIAGNOSIS — D649 Anemia, unspecified: Secondary | ICD-10-CM | POA: Diagnosis not present

## 2013-07-02 DIAGNOSIS — I2510 Atherosclerotic heart disease of native coronary artery without angina pectoris: Secondary | ICD-10-CM | POA: Diagnosis not present

## 2013-07-02 DIAGNOSIS — Z86010 Personal history of colonic polyps: Secondary | ICD-10-CM

## 2013-07-02 DIAGNOSIS — R1310 Dysphagia, unspecified: Secondary | ICD-10-CM

## 2013-07-02 DIAGNOSIS — I10 Essential (primary) hypertension: Secondary | ICD-10-CM | POA: Diagnosis not present

## 2013-07-02 MED ORDER — DICYCLOMINE HCL 20 MG PO TABS: 20.0000 mg | ORAL_TABLET | Freq: Three times a day (TID) | ORAL | Status: DC

## 2013-07-02 MED ORDER — SODIUM CHLORIDE 0.9 % IV SOLN: 500.0000 mL | INTRAVENOUS | Status: DC

## 2013-07-02 NOTE — Patient Instructions (Addendum)
I did not see any abnormalities on the exam but the top of the esophagus did seem somewhat narrow as in [address]11. I did dilate the esophagus as before.  I have sent a prescription for dicyclomine to take before meals to see if that helps with the way you feel after you eat. It is a gastrointestinal muscle relaxer.  Please schedule a follow-up as needed.  Next routine colonoscopy in 5 years - 2020  I appreciate the opportunity to care for you. [name], MD, FACG  YOU HAD AN ENDOSCOPIC PROCEDURE TODAY AT [facility]: Refer to the procedure report that was given to you for any specific questions about what was found during the examination.  If the procedure report does not answer your questions, please call your gastroenterologist to clarify.  If you requested that your care partner not be given the details of your procedure findings, then the procedure report has been included in a sealed envelope for you to review at your convenience later.  YOU SHOULD EXPECT: Some feelings of bloating in the abdomen. Passage of more gas than usual.  Walking can help get rid of the air that was put into your GI tract during the procedure and reduce the bloating. If you had a lower endoscopy (such as a colonoscopy or flexible sigmoidoscopy) you may notice spotting of blood in your stool or on the toilet paper. If you underwent a bowel prep for your procedure, then you may not have a normal bowel movement for a few days.  DIET: Your first meal following the procedure should be a light meal and then it is ok to progress to your normal diet.  A half-sandwich or bowl of soup is an example of a good first meal.  Heavy or fried foods are harder to digest and may make you feel nauseous or bloated.  Likewise meals heavy in dairy and vegetables can cause extra gas to form and this can also increase the bloating.  Drink plenty of fluids but you should avoid alcoholic beverages for 24 hours.  ACTIVITY: Your  care partner should take you home directly after the procedure.  You should plan to take it easy, moving slowly for the rest of the day.  You can resume normal activity the day after the procedure however you should NOT DRIVE or use heavy machinery for 24 hours (because of the sedation medicines used during the test).    SYMPTOMS TO REPORT IMMEDIATELY: A gastroenterologist can be reached at any hour.  During normal business hours, 8:30 AM to 5:00 PM Monday through Friday, call [phone].  After hours and on weekends, please call the GI answering service at [phone] who will take a message and have the physician on call contact you.   Following lower endoscopy (colonoscopy or flexible sigmoidoscopy):  Excessive amounts of blood in the stool  Significant tenderness or worsening of abdominal pains  Swelling of the abdomen that is new, acute  Fever of 100F or higher  Following upper endoscopy (EGD)  Vomiting of blood or coffee ground material  New chest pain or pain under the shoulder blades  Painful or persistently difficult swallowing  New shortness of breath  Fever of 100F or higher  Black, tarry-looking stools  FOLLOW UP: If any biopsies were taken you will be contacted by phone or by letter within the next 1-3 weeks.  Call your gastroenterologist if you have not heard about the biopsies in 3 weeks.  Our staff  will call the home number listed on your records the next business day following your procedure to check on you and address any questions or concerns that you may have at that time regarding the information given to you following your procedure. This is a courtesy call and so if there is no answer at the home number and we have not heard from you through the emergency physician on call, we will assume that you have returned to your regular daily activities without incident.  SIGNATURES/CONFIDENTIALITY: You and/or your care partner have signed paperwork which will be  entered into your electronic medical record.  These signatures attest to the fact that that the information above on your After Visit Summary has been reviewed and is understood.  Full responsibility of the confidentiality of this discharge information lies with you and/or your care-partner.

## 2013-07-02 NOTE — Op Note (Addendum)
[place]  [address] [place], [address]   COLONOSCOPY PROCEDURE REPORT  PATIENT: Willie Garcia, Willie Garcia  MR#: [id number] BIRTHDATE: 04-Dec-1945 , 67  yrs. old GENDER: Male ENDOSCOPIST: [name], MD, [facility] PROCEDURE DATE:  07/02/2013 PROCEDURE:   Colonoscopy, diagnostic First Screening Colonoscopy - Avg.  risk and is 50 yrs.  old or older - No.  Prior Negative Screening - Now for repeat screening. N/A  History of Adenoma - Now for follow-up colonoscopy & has been > or = to 3 yrs.  N/A  Polyps Removed Today? No.  Recommend repeat exam, <10 yrs? No. ASA CLASS:   Class III INDICATIONS:Iron Deficiency Anemia. MEDICATIONS: propofol (Diprivan) 150mg  IV, MAC sedation, administered by CRNA, and These medications were titrated to patient response per physician's verbal order  DESCRIPTION OF PROCEDURE:   After the risks benefits and alternatives of the procedure were thoroughly explained, informed consent was obtained.  A digital rectal exam revealed no abnormalities of the rectum, A digital rectal exam revealed no rectal mass, A digital rectal exam revealed no prostatic nodules, and A digital rectal exam revealed the prostate was not enlarged. The LB [id number]  endoscope was introduced through the anus and advanced to the cecum, which was identified by both the appendix and ileocecal valve. No adverse events experienced.   The quality of the prep was excellent using Suprep  The instrument was then slowly withdrawn as the colon was fully examined.      COLON FINDINGS: The colonic mucosa appeared normal throughout the entire examined colon.   A right colon retroflexion was performed. Retroflexed views revealed no abnormalities. The time to cecum=2 minutes 54 seconds.  Withdrawal time=9 minutes 01 seconds.  The scope was withdrawn and the procedure completed. COMPLICATIONS: There were no complications.  ENDOSCOPIC IMPRESSION: The colonic mucosa appeared  normal throughout the entire examined colon  RECOMMENDATIONS: Dicyclomine 20 mg qAC Repeat routine colonoscopy 5 years 2020 (prior adenomatous polyps 2011)   eSigned:  [name], MD, [facility] 07/02/2013 3:08 PM Revised: 07/02/2013 3:08 PM  cc: Prince Solian, MD, Lurline Del, MD and The Patient

## 2013-07-02 NOTE — Progress Notes (Signed)
Report to pacu rn, vss, bbs=clear 

## 2013-07-02 NOTE — Op Note (Signed)
[place]  [address]. [place], [id number]   ENDOSCOPY PROCEDURE REPORT  PATIENT: Willie Garcia, Willie Garcia  MR#: [id number] BIRTHDATE: [birth year] , 67  yrs. old GENDER: Male ENDOSCOPIST: [name], MD, [facility] PROCEDURE DATE:  07/02/2013 PROCEDURE:  EGD, diagnostic ASA CLASS:     Class III INDICATIONS:  Iron deficiency anemia.   Dysphagia. MEDICATIONS: propofol (Diprivan) 200mg  IV, MAC sedation, administered by CRNA, and These medications were titrated to patient response per physician's verbal order TOPICAL ANESTHETIC: Cetacaine Spray  DESCRIPTION OF PROCEDURE: After the risks benefits and alternatives of the procedure were thoroughly explained, informed consent was obtained.  The LB [id number] endoscope was introduced through the mouth and advanced to the second portion of the duodenum. Without limitations.  The instrument was slowly withdrawn as the mucosa was fully examined.      The upper, middle and distal third of the esophagus were carefully inspected and no abnormalities were noted except for mild suspected stenosis of the UES region.  The z-line was well seen at the GEJ. The endoscope was pushed into the fundus which was normal including a retroflexed view.  The antrum, gastric body, first and second part of the duodenum were unremarkable.  Retroflexed views revealed no abnormalities.     The scope was then withdrawn from the patient and the procedure completed.  COMPLICATIONS: There were no complications. 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  Clear liquids until 4 PM , then soft foods rest of day.  Resume prior diet tomorrow. 2.  Proceed with a Colonoscopy.    eSigned:  [name], MD, [facility] 07/02/2013 2:57 PM   [id number] Dagmar Hait, MD, Lurline Del, MD, and The Patient

## 2013-07-03 ENCOUNTER — Telehealth: Payer: Self-pay | Admitting: *Deleted

## 2013-07-03 NOTE — Telephone Encounter (Signed)
  Follow up Call-  Call back number 07/02/2013  Post procedure Call Back phone  # [phone]  Permission to leave phone message Yes     Patient questions:  Do you have a fever, pain , or abdominal swelling? no Pain Score  0 *  Have you tolerated food without any problems? no  Have you been able to return to your normal activities? yes  Do you have any questions about your discharge instructions: Diet   no Medications  no Follow up visit  no  Do you have questions or concerns about your Care? no  Actions: * If pain score is 4 or above: No action needed, pain <4.

## 2013-07-07 ENCOUNTER — Telehealth: Payer: Self-pay | Admitting: Oncology

## 2013-07-07 ENCOUNTER — Ambulatory Visit (HOSPITAL_BASED_OUTPATIENT_CLINIC_OR_DEPARTMENT_OTHER): Payer: Medicare Other | Admitting: Oncology

## 2013-07-07 VITALS — BP 96/57 | HR 64 | Temp 97.5°F | Resp 18 | Ht 70.0 in | Wt 145.1 lb

## 2013-07-07 DIAGNOSIS — C8299 Follicular lymphoma, unspecified, extranodal and solid organ sites: Secondary | ICD-10-CM

## 2013-07-07 DIAGNOSIS — K389 Disease of appendix, unspecified: Secondary | ICD-10-CM

## 2013-07-07 DIAGNOSIS — C8290 Follicular lymphoma, unspecified, unspecified site: Secondary | ICD-10-CM

## 2013-07-07 DIAGNOSIS — M419 Scoliosis, unspecified: Secondary | ICD-10-CM

## 2013-07-07 DIAGNOSIS — D509 Iron deficiency anemia, unspecified: Secondary | ICD-10-CM

## 2013-07-07 DIAGNOSIS — I2510 Atherosclerotic heart disease of native coronary artery without angina pectoris: Secondary | ICD-10-CM

## 2013-07-07 NOTE — Progress Notes (Signed)
ID: Willie Garcia   DOB: 1945-12-31  MR#: [id number]  YBO#:[id number]  IDP:[id number] R, MD SU: OTHER MD: Willie Garcia, Willie Garcia, Willie Garcia  HISTORY OF PRESENT ILLNESS: Willie Garcia has long had back problems and has been followed for this by Willie Garcia.  On November 20, 2005, the patient had an MRI of the lumbar spine without contrast for evaluation of his chronic progressive low back and left buttock pain.  This showed confluent retroperitoneal adenopathy encasing the renal vessels and measuring up to 5.2 x 3.4 cm transversely. There was no epidural mass and the kidneys appeared unremarkable.  This had been compared with an MRI from Nov 09, 2004 where no such adenopathy was noted.    The patient's wife, Willie Garcia, is a Museum/gallery curator at [name], so she brought this to the attention of Willie Garcia and a CT Scan of the chest, abdomen and pelvis was obtained there on June 11th.  It was ready by Willie Garcia, showing some small mediastinal lymph nodes, the largest being 1.4 cm, but significant adenopathy surrounding the left renal vein with anterior displacement of that vein as well as the inferior vena cava.  This measured 5.2 cm in transverse diameter and 3.8 cm in AP diameter.  The adenopathy extended inferiorly to the level of the aortic bifurcation.  There were small mesenteric lymph nodes, all less than 12 mm. The spleen was upper normal in size with no focal lesions.  There was no pelvic adenopathy.  His subsequent history is as detailed below  INTERVAL HISTORY: Willie Garcia returns today for followup of his non-Hodgkin's lymphoma accompanied by his wife Willie Garcia. The interval history is generally unremarkable. They have not yet sold their house, and he continues to "fix up the place". He is being evaluated by gastroenterology for aren't deficiency in a CT of the abdomen obtained 06/16/2013 showed an appendicolith, which is of concern to the patient. On 07/02/2013 he had a colonoscopy which was entirely  normal and did not show celiac disease. 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  REVIEW OF SYSTEMS: Grade tells me his energy is fine, his appetite is better. He has had no fevers, rash, bleeding, unexplained fatigue, or adenopathy. He is having absolutely no abdominal discomfort, no nausea or vomiting, no taste alteration. His weight is actually a little better than before. He does have chronic back pain. This is unchanged from baseline. A detailed review systems was otherwise noncontributory  PAST MEDICAL HISTORY: Past Medical History  Diagnosis Date  . Allergic rhinitis   . Non Hodgkin's lymphoma   . CAD (coronary artery disease)   . Depression   . GERD (gastroesophageal reflux disease)   . Hyperlipidemia   . HTN (hypertension)   . Osteopenia   . Anemia   . Anxiety   . Scoliosis   . BPH (benign prostatic hyperplasia)   . Personal history of colonic adenomas 04/04/2010    PAST SURGICAL HISTORY: Past Surgical History  Procedure Laterality Date  . Coronary artery bypass graft    . Rhinoplasty    . Hernia repair    . Colonoscopy    . Upper gastrointestinal endoscopy      FAMILY HISTORY Family History  Problem Relation Age of Onset  . Colonic polyp    . Diabetes    . Heart disease    . Colon cancer Neg Hx   . Stomach cancer Neg Hx   .  Esophageal cancer Neg Hx   . Rectal cancer Neg Hx   The patient's father died at the age of 61 from heart problems.  The patient's mother died at the age of 84 from heart problems.  The patient has three brothers and two sisters.  The only cancer in the family known to the patient is the maternal grandmother, who had breast cancer diagnosed in her fifties or 20.    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  He has been married to Willie Garcia for >30 years.  They have no children of their own. They attend [employer] [facility] [employer]   ADVANCED DIRECTIVES:  HEALTH MAINTENANCE: History  Substance Use Topics  . Smoking status: Never Smoker   . Smokeless tobacco: Never Used  . Alcohol Use: No     Colonoscopy:  PAP:  Bone density:  Lipid panel:  No Known Allergies  Current Outpatient Prescriptions  Medication Sig Dispense Refill  . Ascorbic Acid (VITAMIN C) 500 MG tablet Take 500 mg by mouth daily.        Marland Kitchen aspirin 325 MG tablet Take 325 mg by mouth daily.        . cholecalciferol (VITAMIN D) 400 UNITS TABS Take by mouth.      . Cyanocobalamin (VITAMIN B-12 IJ) Inject as directed every 30 (thirty) days.      Marland Kitchen dicyclomine (BENTYL) 20 MG tablet Take 1 tablet (20 mg total) by mouth 3 (three) times daily before meals.  90 tablet  11  . ezetimibe (ZETIA) 10 MG tablet Take 10 mg by mouth daily.      . fluticasone (FLONASE) 50 MCG/ACT nasal spray Place 2 sprays into the nose daily.      . folic acid (FOLVITE) 1 MG tablet Take 1 mg by mouth daily.        . metoprolol (TOPROL-XL) 50 MG 24 hr tablet Take 50 mg by mouth daily.        . Multiple Vitamin (MULTIVITAMIN) capsule Take 1 capsule by mouth daily.        . rosuvastatin (CRESTOR) 20 MG tablet Take 20 mg by mouth daily.        Marland Kitchen testosterone cypionate (DEPOTESTOTERONE CYPIONATE) 200 MG/ML injection AS DIRECTED       No current facility-administered medications for this visit.    OBJECTIVE: Middle-aged white male who appears stated age 68 Vitals:   07/07/13 1320  BP: 96/57  Pulse: 64  Temp: 97.5 F (36.4 C)  Resp: 18     Body mass index is 20.82 kg/(m^2).    ECOG FS: 1  Sclerae unicteric, pupils equal and round Oropharynx slightly dry, no lesions noted No cervical or supraclavicular adenopathy; no axillary adenopathy Lungs no rales or rhonchi Heart regular rate and rhythm Abd no splenomegaly; positive bowel sounds; nontender MSK scoliosis but no focal spinal tenderness, no peripheral edema Neuro: nonfocal, well oriented, appropriate  affect  LAB RESULTS: Results for Willie Garcia, Willie Garcia (MRN [id number]) as of 07/12/2013 09:35  Ref. Range 01/09/2013 08:22 03/12/2013 12:17 04/09/2013 10:30 06/30/2013 13:48  Ferritin Latest Range: 22-316 ng/ml 15 (L) 610 (H) 495 (H) 346 (H)    Lab Results  Component Value Date   WBC 4.9 06/30/2013   NEUTROABS 2.7 06/30/2013   HGB 14.0 06/30/2013   HCT 41.5 06/30/2013   MCV 96.1 06/30/2013   PLT 193 06/30/2013      Chemistry      Component Value Date/Time   NA  141 06/30/2013 1348   NA 138 11/27/2012 1136   K 4.2 06/30/2013 1348   K 4.1 11/27/2012 1136   CL 103 11/27/2012 1136   CL 101 06/30/2012 1325   CO2 27 06/30/2013 1348   CO2 31 11/27/2012 1136   BUN 14.0 06/30/2013 1348   BUN 10 06/10/2013 1118   CREATININE 0.9 06/30/2013 1348   CREATININE 1.0 06/10/2013 1118      Component Value Date/Time   CALCIUM 9.3 06/30/2013 1348   CALCIUM 9.4 11/27/2012 1136   ALKPHOS 95 06/30/2013 1348   ALKPHOS 69 04/07/2013 0801   AST 16 06/30/2013 1348   AST 20 04/07/2013 0801   ALT 14 06/30/2013 1348   ALT 15 04/07/2013 0801   BILITOT 0.81 06/30/2013 1348   BILITOT 1.0 04/07/2013 0801       No results found for this basename: LABCA2    No components found with this basename: LABCA125    No results found for this basename: INR,  in the last 168 hours  Urinalysis No results found for this basename: colorurine,  appearanceur,  labspec,  phurine,  glucoseu,  hgbur,  bilirubinur,  ketonesur,  proteinur,  urobilinogen,  nitrite,  leukocytesur    STUDIES: Ct Abdomen Pelvis W Contrast  06/16/2013   CLINICAL DATA:  Abdominal pain.  History of non-Hodgkin's lymphoma.  EXAM: CT ABDOMEN AND PELVIS WITH CONTRAST  TECHNIQUE: Multidetector CT imaging of the abdomen and pelvis was performed using the standard protocol following bolus administration of intravenous contrast.  CONTRAST:  75mL OMNIPAQUE IOHEXOL 300 MG/ML  SOLN  COMPARISON:  CT of the abdomen and pelvis 05/15/2013.  FINDINGS: Lung Bases: Postoperative  changes of CABG.  Abdomen/Pelvis: The appearance of the liver, gallbladder, pancreas, spleen, bilateral adrenal glands and bilateral kidneys is unremarkable. No significant volume of ascites. No pathologic distention of small bowel. No definite lymphadenopathy identified within the abdomen or pelvis. Mild median lobe hypertrophy in the prostate gland. Urinary bladder is unremarkable in appearance.  There is an appendicolith at the neck of the appendix (image 56 of series 2). The appendix itself appears normal in caliber measuring approximately 6 mm in diameter, however, despite the presence of a large amount of oral contrast material within the cecum, the appendix does not opacify with oral contrast material. Additionally, there are some subtle periappendiceal inflammatory changes as demonstrated by stranding in the retroperitoneal fat best visualized on images 43-46 of series 2. The appendix is retrocecal in position. No definite periappendiceal abscess at this time. No pneumoperitoneum to suggest frank perforation.  Musculoskeletal: Sclerotic lesion with chondroid features in the right femoral neck incompletely visualized, but unchanged in size compared to prior study from 03/26/2008, presumably an enchondroma. There are no aggressive appearing lytic or blastic lesions noted in the visualized portions of the skeleton.  IMPRESSION: 1. There is an appendicolith in the neck of the appendix. This appendicolith has been in this position since 2009. The appendix is retrocecal in position, and fails to fill with oral contrast material despite the presence of a large amount of oral contrast material in the cecum. Although the appendix is normal in size (6 mm), there is some subtle periappendiceal stranding which could indicate inflammation as an early sign of acute appendicitis. These findings are not definitive, but clinical correlation for signs and symptoms of early acute appendicitis is recommended at this time. 2. No  other potential acute findings are confidently identified in the abdomen and pelvis to account for the patient's symptoms at  this time. 3. No lymphadenopathy identified to suggest recurrence of disease in this patient with history of non-Hodgkin's lymphoma. 4. Additional incidental findings, as above. These results were called by telephone at the time of interpretation on 06/16/2013 at 10:55 AM to Dr. Silvano Garcia, who verbally acknowledged these results.   Electronically Signed   By: Vinnie Langton M.D.   On: 06/16/2013 10:56    ASSESSMENT: 68 y.o. 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   PLAN:  Alexio is doing fine from a lymphoma point of view, with no evidence of disease activity. She looks less depressed and more interactive today. His gained a little bit of weight.  We discussed the appendix "stone" which was incidentally noted, and as he has no symptoms this is of no consequence. We discussed possible surgical referral, but I do not think this is really indicated at present. He will call if he develops any abdominal pain, cramping, change in bowel habits, nausea, vomiting, or fever.  Overall there is no indication for any intervention and we will see him again in 6 months. He knows to call for any problems that may develop before that visit.  Devota Viruet C    07/07/2013

## 2013-07-07 NOTE — Telephone Encounter (Signed)
gv pt appt schedule for july °

## 2013-07-09 DIAGNOSIS — I2510 Atherosclerotic heart disease of native coronary artery without angina pectoris: Secondary | ICD-10-CM | POA: Diagnosis not present

## 2013-07-09 DIAGNOSIS — K219 Gastro-esophageal reflux disease without esophagitis: Secondary | ICD-10-CM | POA: Diagnosis not present

## 2013-07-09 DIAGNOSIS — C8589 Other specified types of non-Hodgkin lymphoma, extranodal and solid organ sites: Secondary | ICD-10-CM | POA: Diagnosis not present

## 2013-07-09 DIAGNOSIS — E785 Hyperlipidemia, unspecified: Secondary | ICD-10-CM | POA: Diagnosis not present

## 2013-07-09 DIAGNOSIS — F4321 Adjustment disorder with depressed mood: Secondary | ICD-10-CM | POA: Diagnosis not present

## 2013-07-09 DIAGNOSIS — M5030 Other cervical disc degeneration, unspecified cervical region: Secondary | ICD-10-CM | POA: Diagnosis not present

## 2013-07-09 DIAGNOSIS — I10 Essential (primary) hypertension: Secondary | ICD-10-CM | POA: Diagnosis not present

## 2013-07-09 DIAGNOSIS — D649 Anemia, unspecified: Secondary | ICD-10-CM | POA: Diagnosis not present

## 2013-09-24 DIAGNOSIS — M4120 Other idiopathic scoliosis, site unspecified: Secondary | ICD-10-CM | POA: Diagnosis not present

## 2013-11-02 ENCOUNTER — Encounter: Payer: Self-pay | Admitting: Podiatry

## 2013-11-02 ENCOUNTER — Ambulatory Visit (INDEPENDENT_AMBULATORY_CARE_PROVIDER_SITE_OTHER): Payer: Medicare Other | Admitting: Podiatry

## 2013-11-02 VITALS — BP 100/69 | HR 62 | Resp 16

## 2013-11-02 DIAGNOSIS — L600 Ingrowing nail: Secondary | ICD-10-CM | POA: Diagnosis not present

## 2013-11-02 DIAGNOSIS — I2510 Atherosclerotic heart disease of native coronary artery without angina pectoris: Secondary | ICD-10-CM | POA: Diagnosis not present

## 2013-11-02 NOTE — Progress Notes (Signed)
   Subjective:    Patient ID: Willie Garcia, male    DOB: [birth year], 68 y.o.   MRN: [id number]  HPI Comments: "I have an ingrown toenail"  Patient c/o aching 1st toe left, medial border, for several years. He has been trimming it out. It has been infected before.   Toe Pain       Review of Systems  HENT: Positive for tinnitus.   All other systems reviewed and are negative.      Objective:   Physical Exam        Assessment & Plan:

## 2013-11-02 NOTE — Patient Instructions (Addendum)
ANTIBACTERIAL SOAP INSTRUCTIONS  THE DAY AFTER PROCEDURE  Please follow the instructions your doctor has marked.   Shower as usual. Before getting out, place a drop of antibacterial liquid soap (Dial) on a wet, clean washcloth.  Gently wipe washcloth over affected area.  Afterward, rinse the area with warm water.  Blot the area dry with a soft cloth and cover with antibiotic ointment (neosporin, polysporin, bacitracin) and band aid or gauze and tape  OR   Place 3-4 drops of antibacterial liquid soap in a quart of warm tap water.  Submerge foot into water for 20 minutes.  If bandage was applied after your procedure, leave on to allow for easy lift off, then remove and continue with soak for the remaining time.  Next, blot area dry with a soft cloth and cover with a bandage.  Apply other medications as directed by your doctor, such as cortisporin otic solution (eardrops) or neosporin antibiotic ointment   Long Term Care Instructions-Post Nail Surgery  You have had your ingrown toenail and root treated with a chemical.  This chemical causes a burn that will drain and ooze like a blister.  This can drain for 6-8 weeks or longer.  It is important to keep this area clean, covered, and follow the soaking instructions dispensed at the time of your surgery.  This area will eventually dry and form a scab.  Once the scab forms you no longer need to soak or apply a dressing.  If at any time you experience an increase in pain, redness, swelling, or drainage, you should contact the office as soon as possible.

## 2013-11-02 NOTE — Progress Notes (Signed)
Subjective:     Patient ID: Willie Garcia, male   DOB: 03-24-1946, 68 y.o.   MRN: [id number]  Toe Pain    patient presents with chronic ingrown toenail left big toe medial border he cannot take care of himself and was cut out several years ago   Review of Systems  All other systems reviewed and are negative.      Objective:   Physical Exam  Nursing note and vitals reviewed. Constitutional: He is oriented to person, place, and time.  Cardiovascular: Intact distal pulses.   Musculoskeletal: Normal range of motion.  Neurological: He is oriented to person, place, and time.  Skin: Skin is warm.   neurovascular status is intact with range of motion adequate subtalar midtarsal joint and muscle strength within normal limits. Patient's left big toe medial border is incurvated and sore and he cannot cut it himself or soak it     Assessment:     Ingrown toenail deformity left hallux with a small spicule noted    Plan:     H&P and condition discussed with patient. I recommended correction of the corner and explained risk in today I infiltrated the left hallux 60 mg Xylocaine Marcaine mixture remove the medial border exposed matrix chemical phenol 3 applications followed by alcohol and then sterile dressing. Begin soaks and reappoint

## 2013-11-30 DIAGNOSIS — H259 Unspecified age-related cataract: Secondary | ICD-10-CM | POA: Diagnosis not present

## 2013-11-30 DIAGNOSIS — H5200 Hypermetropia, unspecified eye: Secondary | ICD-10-CM | POA: Diagnosis not present

## 2013-11-30 DIAGNOSIS — H43819 Vitreous degeneration, unspecified eye: Secondary | ICD-10-CM | POA: Diagnosis not present

## 2013-11-30 DIAGNOSIS — H52209 Unspecified astigmatism, unspecified eye: Secondary | ICD-10-CM | POA: Diagnosis not present

## 2013-12-10 ENCOUNTER — Other Ambulatory Visit: Payer: Self-pay

## 2013-12-15 ENCOUNTER — Ambulatory Visit (INDEPENDENT_AMBULATORY_CARE_PROVIDER_SITE_OTHER): Payer: Medicare Other | Admitting: Internal Medicine

## 2013-12-15 ENCOUNTER — Encounter: Payer: Self-pay | Admitting: Internal Medicine

## 2013-12-15 VITALS — BP 114/69 | HR 55 | Ht 70.0 in | Wt 144.0 lb

## 2013-12-15 DIAGNOSIS — I2510 Atherosclerotic heart disease of native coronary artery without angina pectoris: Secondary | ICD-10-CM | POA: Diagnosis not present

## 2013-12-15 DIAGNOSIS — E785 Hyperlipidemia, unspecified: Secondary | ICD-10-CM | POA: Diagnosis not present

## 2013-12-15 NOTE — Progress Notes (Signed)
HPI Mr. Angell returns for followup. He is a pleasant 68 yo man with a h/o CAD, dyslipidemia, s/p CABG and follicular lympyhoma. In the interim he has done well. He denies chest pain or sob. No syncope. He remains active. No Known Allergies   Current Outpatient Prescriptions  Medication Sig Dispense Refill  . aspirin 325 MG tablet Take 325 mg by mouth daily.        . cholecalciferol (VITAMIN D) 400 UNITS TABS Take 400 Units by mouth daily.       . Cyanocobalamin (VITAMIN B-12 IJ) Inject as directed every 30 (thirty) days.      Marland Kitchen ezetimibe (ZETIA) 10 MG tablet Take 10 mg by mouth daily.      . fluticasone (FLONASE) 50 MCG/ACT nasal spray Place 2 sprays into the nose daily.      . folic acid (FOLVITE) 1 MG tablet Take 1 mg by mouth daily.        . metoprolol (TOPROL-XL) 50 MG 24 hr tablet Take 50 mg by mouth daily.        . Multiple Vitamin (MULTIVITAMIN) capsule Take 1 capsule by mouth daily.        . rosuvastatin (CRESTOR) 20 MG tablet Take 20 mg by mouth daily.        Marland Kitchen testosterone cypionate (DEPOTESTOTERONE CYPIONATE) 200 MG/ML injection AS DIRECTED       No current facility-administered medications for this visit.     Past Medical History  Diagnosis Date  . Allergic rhinitis   . Non Hodgkin's lymphoma   . CAD (coronary artery disease)   . Depression   . GERD (gastroesophageal reflux disease)   . Hyperlipidemia   . HTN (hypertension)   . Osteopenia   . Anemia   . Anxiety   . Scoliosis   . BPH (benign prostatic hyperplasia)   . Personal history of colonic adenomas 04/04/2010    ROS:   All systems reviewed and negative except as noted in the HPI.   Past Surgical History  Procedure Laterality Date  . Coronary artery bypass graft    . Rhinoplasty    . Hernia repair    . Colonoscopy    . Upper gastrointestinal endoscopy       Family History  Problem Relation Age of Onset  . Colonic polyp    . Diabetes    . Heart disease    . Colon cancer Neg Hx   . Stomach  cancer Neg Hx   . Esophageal cancer Neg Hx   . Rectal cancer Neg Hx      History   Social History  . Marital Status: Married    Spouse Name: N/A    Number of Children: N/A  . Years of Education: N/A   Occupational History  . Retired    Social History Main Topics  . Smoking status: Never Smoker   . Smokeless tobacco: Never Used  . Alcohol Use: No  . Drug Use: No  . Sexual Activity: Not on file   Other Topics Concern  . Not on file   Social History Narrative   Married, wife is a Marine scientist, no children   Retired Microbiologist, flies as a hobby   2 caffeinated beverages daily     BP 114/69  Pulse 55  Ht 5\' 10"  (1.778 m)  Wt 144 lb (65.318 kg)  BMI 20.66 kg/m2  Physical Exam:  Well appearing 68 yo man,NAD HEENT: Unremarkable Neck:  no JVD, no thyromegally Lungs:  Clear with no wheezes HEART:  Regular rate rhythm, no murmurs, no rubs, no clicks Abd:  soft, positive bowel sounds, no organomegally, no rebound, no guarding Ext:  2 plus pulses, no edema, no cyanosis, no clubbing Skin:  No rashes no nodules Neuro:  CN II through XII intact, motor grossly intact  EKG - nsr   Assess/Plan:

## 2013-12-15 NOTE — Patient Instructions (Signed)
Your physician wants you to follow-up in: 12 months with Dr. Taylor. You will receive a reminder letter in the mail two months in advance. If you don't receive a letter, please call our office to schedule the follow-up appointment.    

## 2013-12-15 NOTE — Assessment & Plan Note (Signed)
He will continue Crestor. No muscle aches.

## 2013-12-15 NOTE — Assessment & Plan Note (Signed)
He denies anginal symptoms. He remains active with multiple projects ongoing. He will continue his current meds.

## 2013-12-28 ENCOUNTER — Other Ambulatory Visit: Payer: Self-pay | Admitting: *Deleted

## 2013-12-28 DIAGNOSIS — C8290 Follicular lymphoma, unspecified, unspecified site: Secondary | ICD-10-CM

## 2013-12-29 ENCOUNTER — Other Ambulatory Visit (HOSPITAL_BASED_OUTPATIENT_CLINIC_OR_DEPARTMENT_OTHER): Payer: Medicare Other

## 2013-12-29 DIAGNOSIS — C8299 Follicular lymphoma, unspecified, extranodal and solid organ sites: Secondary | ICD-10-CM | POA: Diagnosis not present

## 2013-12-29 DIAGNOSIS — C8290 Follicular lymphoma, unspecified, unspecified site: Secondary | ICD-10-CM

## 2013-12-29 LAB — CBC WITH DIFFERENTIAL/PLATELET
BASO%: 1.1 % (ref 0.0–2.0)
Basophils Absolute: 0.1 10*3/uL (ref 0.0–0.1)
EOS%: 1.3 % (ref 0.0–7.0)
Eosinophils Absolute: 0.1 10*3/uL (ref 0.0–0.5)
HCT: 38.9 % (ref 38.4–49.9)
HGB: 13 g/dL (ref 13.0–17.1)
LYMPH%: 24.6 % (ref 14.0–49.0)
MCH: 31.4 pg (ref 27.2–33.4)
MCHC: 33.4 g/dL (ref 32.0–36.0)
MCV: 94 fL (ref 79.3–98.0)
MONO#: 0.5 10*3/uL (ref 0.1–0.9)
MONO%: 9.5 % (ref 0.0–14.0)
NEUT#: 3.6 10*3/uL (ref 1.5–6.5)
NEUT%: 63.5 % (ref 39.0–75.0)
Platelets: 192 10*3/uL (ref 140–400)
RBC: 4.14 10*6/uL — ABNORMAL LOW (ref 4.20–5.82)
RDW: 12.5 % (ref 11.0–14.6)
WBC: 5.6 10*3/uL (ref 4.0–10.3)
lymph#: 1.4 10*3/uL (ref 0.9–3.3)

## 2013-12-29 LAB — COMPREHENSIVE METABOLIC PANEL (CC13)
ALT: 15 U/L (ref 0–55)
AST: 19 U/L (ref 5–34)
Albumin: 4.3 g/dL (ref 3.5–5.0)
Alkaline Phosphatase: 81 U/L (ref 40–150)
Anion Gap: 6 mEq/L (ref 3–11)
BUN: 23.1 mg/dL (ref 7.0–26.0)
CO2: 29 mEq/L (ref 22–29)
Calcium: 9.5 mg/dL (ref 8.4–10.4)
Chloride: 103 mEq/L (ref 98–109)
Creatinine: 1 mg/dL (ref 0.7–1.3)
Glucose: 86 mg/dl (ref 70–140)
Potassium: 4.3 mEq/L (ref 3.5–5.1)
Sodium: 139 mEq/L (ref 136–145)
Total Bilirubin: 0.83 mg/dL (ref 0.20–1.20)
Total Protein: 7.1 g/dL (ref 6.4–8.3)

## 2013-12-30 LAB — FERRITIN CHCC: Ferritin: 268 ng/ml (ref 22–316)

## 2014-01-05 ENCOUNTER — Ambulatory Visit (HOSPITAL_BASED_OUTPATIENT_CLINIC_OR_DEPARTMENT_OTHER): Payer: Medicare Other | Admitting: Oncology

## 2014-01-05 ENCOUNTER — Telehealth: Payer: Self-pay | Admitting: Oncology

## 2014-01-05 VITALS — BP 114/72 | HR 57 | Temp 97.6°F | Resp 18 | Ht 70.0 in | Wt 142.6 lb

## 2014-01-05 DIAGNOSIS — M549 Dorsalgia, unspecified: Secondary | ICD-10-CM | POA: Diagnosis not present

## 2014-01-05 DIAGNOSIS — C8299 Follicular lymphoma, unspecified, extranodal and solid organ sites: Secondary | ICD-10-CM | POA: Diagnosis not present

## 2014-01-05 DIAGNOSIS — C8290 Follicular lymphoma, unspecified, unspecified site: Secondary | ICD-10-CM

## 2014-01-05 NOTE — Progress Notes (Signed)
ID: [name]   DOB: [birth year]  MR#: [id number]  ZJQ#:[id number]  WIO:[id number] R, MD SU: OTHER MD: Willie Garcia, Willie Garcia, Willie Garcia  HISTORY OF PRESENT ILLNESS: Willie Garcia has long had back problems and has been followed for this by Dr. [name].  On November 20, 2005, the patient had an MRI of the lumbar spine without contrast for evaluation of his chronic progressive low back and left buttock pain.  This showed confluent retroperitoneal adenopathy encasing the renal vessels and measuring up to 5.2 x 3.4 cm transversely. There was no epidural mass and the kidneys appeared unremarkable.  This had been compared with an MRI from Nov 09, 2004 where no such adenopathy was noted.    The patient's wife, Willie Garcia, is a Museum/gallery curator at [name], so she brought this to the attention of [name] and a CT Scan of the chest, abdomen and pelvis was obtained there on June 11th.  It was ready by [name], showing some small mediastinal lymph nodes, the largest being 1.4 cm, but significant adenopathy surrounding the left renal vein with anterior displacement of that vein as well as the inferior vena cava.  This measured 5.2 cm in transverse diameter and 3.8 cm in AP diameter.  The adenopathy extended inferiorly to the level of the aortic bifurcation.  There were small mesenteric lymph nodes, all less than 12 mm. The spleen was upper normal in size with no focal lesions.  There was no pelvic adenopathy.  His subsequent history is as detailed below  INTERVAL HISTORY: Willie Garcia returns today for followup of his non-Hodgkin's lymphoma accompanied by his wife Willie Garcia. The interval history is stable. They still have not sold their property and he continues to do a lot of work there except at its very hot out there and that got back on his "work time".  REVIEW OF SYSTEMS: His weight is very stable. He denies unusual fatigue. There have been no fevers, drenching sweats, rash, or pruritus. There've been no  intercurrent infections. He is not aware of any adenopathy. He does have some back pain and is working with Dr. [name] regarding a scoliosis. His primary care physician Dr. is working him up for bone density concerns. Otherwise a detailed review of systems today was noncontributory  PAST MEDICAL HISTORY: Past Medical History  Diagnosis Date  . Allergic rhinitis   . Non Hodgkin's lymphoma   . CAD (coronary artery disease)   . Depression   . GERD (gastroesophageal reflux disease)   . Hyperlipidemia   . HTN (hypertension)   . Osteopenia   . Anemia   . Anxiety   . Scoliosis   . BPH (benign prostatic hyperplasia)   . Personal history of colonic adenomas 04/04/2010    PAST SURGICAL HISTORY: Past Surgical History  Procedure Laterality Date  . Coronary artery bypass graft    . Rhinoplasty    . Hernia repair    . Colonoscopy    . Upper gastrointestinal endoscopy      FAMILY HISTORY Family History  Problem Relation Age of Onset  . Colonic polyp    . Diabetes    . Heart disease    . Colon cancer Neg Hx   . Stomach cancer Neg Hx   . Esophageal cancer Neg Hx   . Rectal cancer Neg Hx   The patient's father died at the age of 47 from heart problems.  The patient's mother died at the age of 79 from heart problems.  The patient  has three brothers and two sisters.  The only cancer in the family known to the patient is the maternal grandmother, who had breast cancer diagnosed in her fifties or 46.    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  He has been married to [place] for >30 years.  They have no children of their own. They attend [employer]   ADVANCED DIRECTIVES:  HEALTH MAINTENANCE: History  Substance Use Topics  . Smoking status: Never Smoker   . Smokeless tobacco: Never Used  . Alcohol Use: No     Colonoscopy: JAN 2015 (with EGD)-- WNL  PSA:  Bone density:  Lipid panel:  No Known Allergies  Current Outpatient  Prescriptions  Medication Sig Dispense Refill  . aspirin 325 MG tablet Take 325 mg by mouth daily.        . cholecalciferol (VITAMIN D) 400 UNITS TABS Take 400 Units by mouth daily.       . Cyanocobalamin (VITAMIN B-12 IJ) Inject as directed every 30 (thirty) days.      Marland Kitchen ezetimibe (ZETIA) 10 MG tablet Take 10 mg by mouth daily.      . fluticasone (FLONASE) 50 MCG/ACT nasal spray Place 2 sprays into the nose daily.      . folic acid (FOLVITE) 1 MG tablet Take 1 mg by mouth daily.        . metoprolol (TOPROL-XL) 50 MG 24 hr tablet Take 50 mg by mouth daily.        . Multiple Vitamin (MULTIVITAMIN) capsule Take 1 capsule by mouth daily.        . rosuvastatin (CRESTOR) 20 MG tablet Take 20 mg by mouth daily.        Marland Kitchen testosterone cypionate (DEPOTESTOTERONE CYPIONATE) 200 MG/ML injection AS DIRECTED       No current facility-administered medications for this visit.    OBJECTIVE: Middle-aged white man in no acute distress Filed Vitals:   01/05/14 1357  BP: 114/72  Pulse: 57  Temp: 97.6 F (36.4 C)  Resp: 18     Body mass index is 20.46 kg/(m^2).    ECOG FS: 1  Sclerae unicteric, EOMs intact Oropharynx clear and moist No cervical or supraclavicular adenopathy; no axillary adenopathy Lungs no rales or rhonchi Heart regular rate and rhythm Abd soft, nontender, positive bowel sounds, no splenomegaly MSK scoliosis but no focal spinal tenderness, no peripheral edema Neuro: nonfocal, well oriented, appropriate affect  LAB RESULTS: Results for Willie Garcia, Willie Garcia (MRN [id number]) as of 01/05/2014 14:03  Ref. Range 01/09/2013 08:22 03/12/2013 12:17 04/09/2013 10:30 06/30/2013 13:48 12/29/2013 13:53  Ferritin Latest Range: 22-316 ng/ml 15 (L) 610 (H) 495 (H) 346 (H) 268    Lab Results  Component Value Date   WBC 5.6 12/29/2013   NEUTROABS 3.6 12/29/2013   HGB 13.0 12/29/2013   HCT 38.9 12/29/2013   MCV 94.0 12/29/2013   PLT 192 12/29/2013      Chemistry      Component Value Date/Time   NA 139  12/29/2013 1353   NA 138 11/27/2012 1136   K 4.3 12/29/2013 1353   K 4.1 11/27/2012 1136   CL 103 11/27/2012 1136   CL 101 06/30/2012 1325   CO2 29 12/29/2013 1353   CO2 31 11/27/2012 1136   BUN 23.1 12/29/2013 1353   BUN 10 06/10/2013 1118   CREATININE 1.0 12/29/2013 1353   CREATININE 1.0 06/10/2013 1118      Component Value Date/Time  CALCIUM 9.5 12/29/2013 1353   CALCIUM 9.4 11/27/2012 1136   ALKPHOS 81 12/29/2013 1353   ALKPHOS 69 04/07/2013 0801   AST 19 12/29/2013 1353   AST 20 04/07/2013 0801   ALT 15 12/29/2013 1353   ALT 15 04/07/2013 0801   BILITOT 0.83 12/29/2013 1353   BILITOT 1.0 04/07/2013 0801       No results found for this basename: LABCA2    No components found with this basename: LABCA125    No results found for this basename: INR,  in the last 168 hours  Urinalysis No results found for this basename: colorurine,  appearanceur,  labspec,  phurine,  glucoseu,  hgbur,  bilirubinur,  ketonesur,  proteinur,  urobilinogen,  nitrite,  leukocytesur  Results for ISAISH, ALEMU (MRN [id number]) as of 01/05/2014 14:03  Ref. Range 03/28/2011 08:13 07/02/2011 11:23 12/26/2011 10:35 06/30/2012 13:25 01/09/2013 08:22  LDH Latest Range: 125-245 U/L 128 119 120 231 132    STUDIES: Ct Abdomen Pelvis W Contrast  06/16/2013   CLINICAL DATA:  Abdominal pain.  History of non-Hodgkin's lymphoma.  EXAM: CT ABDOMEN AND PELVIS WITH CONTRAST  TECHNIQUE: Multidetector CT imaging of the abdomen and pelvis was performed using the standard protocol following bolus administration of intravenous contrast.  CONTRAST:  67mL OMNIPAQUE IOHEXOL 300 MG/ML  SOLN  COMPARISON:  CT of the abdomen and pelvis 05/15/2013.  FINDINGS: Lung Bases: Postoperative changes of CABG.  Abdomen/Pelvis: The appearance of the liver, gallbladder, pancreas, spleen, bilateral adrenal glands and bilateral kidneys is unremarkable. No significant volume of ascites. No pathologic distention of small bowel. No definite lymphadenopathy  identified within the abdomen or pelvis. Mild median lobe hypertrophy in the prostate gland. Urinary bladder is unremarkable in appearance.  There is an appendicolith at the neck of the appendix (image 56 of series 2). The appendix itself appears normal in caliber measuring approximately 6 mm in diameter, however, despite the presence of a large amount of oral contrast material within the cecum, the appendix does not opacify with oral contrast material. Additionally, there are some subtle periappendiceal inflammatory changes as demonstrated by stranding in the retroperitoneal fat best visualized on images 43-46 of series 2. The appendix is retrocecal in position. No definite periappendiceal abscess at this time. No pneumoperitoneum to suggest frank perforation.  Musculoskeletal: Sclerotic lesion with chondroid features in the right femoral neck incompletely visualized, but unchanged in size compared to prior study from 03/26/2008, presumably an enchondroma. There are no aggressive appearing lytic or blastic lesions noted in the visualized portions of the skeleton.  IMPRESSION: 1. There is an appendicolith in the neck of the appendix. This appendicolith has been in this position since 2009. The appendix is retrocecal in position, and fails to fill with oral contrast material despite the presence of a large amount of oral contrast material in the cecum. Although the appendix is normal in size (6 mm), there is some subtle periappendiceal stranding which could indicate inflammation as an early sign of acute appendicitis. These findings are not definitive, but clinical correlation for signs and symptoms of early acute appendicitis is recommended at this time. 2. No other potential acute findings are confidently identified in the abdomen and pelvis to account for the patient's symptoms at this time. 3. No lymphadenopathy identified to suggest recurrence of disease in this patient with history of non-Hodgkin's lymphoma. 4.  Additional incidental findings, as above. These results were called by telephone at the time of interpretation on 06/16/2013 at 10:55 AM to Dr.  Silvano Garcia, who verbally acknowledged these results.   Electronically Signed   By: Vinnie Langton M.D.   On: 06/16/2013 10:56    ASSESSMENT: 68 y.o. 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   PLAN:  There is no evidence of lymphoma activity, and I am going to start seeing him on a once a year basis. We will continue to check labs every 6 months, including a beta-2 microglobulin and LDH   Willie Garcia is very concerned regarding his weight but his weight is entirely stable as compared to last year. His activity level seems normal to me for someone this age. He appears less depressed this visit.  His biggest concern is the back. There is some hesitancy regarding any surgical intervention particularly given the question of osteoporosis. His primary care physician Dr.Avva is working this up.  Otherwise Lawerance will call with any problems that may develop before his next visit.  Mehlani Blankenburg C    01/05/2014

## 2014-01-05 NOTE — Telephone Encounter (Signed)
per pof to sch pt-gave pt copy of sch

## 2014-01-06 DIAGNOSIS — I2510 Atherosclerotic heart disease of native coronary artery without angina pectoris: Secondary | ICD-10-CM | POA: Diagnosis not present

## 2014-01-06 DIAGNOSIS — F4321 Adjustment disorder with depressed mood: Secondary | ICD-10-CM | POA: Diagnosis not present

## 2014-01-06 DIAGNOSIS — IMO0002 Reserved for concepts with insufficient information to code with codable children: Secondary | ICD-10-CM | POA: Diagnosis not present

## 2014-01-06 DIAGNOSIS — R630 Anorexia: Secondary | ICD-10-CM | POA: Diagnosis not present

## 2014-01-06 DIAGNOSIS — I10 Essential (primary) hypertension: Secondary | ICD-10-CM | POA: Diagnosis not present

## 2014-01-06 DIAGNOSIS — M949 Disorder of cartilage, unspecified: Secondary | ICD-10-CM | POA: Diagnosis not present

## 2014-01-06 DIAGNOSIS — M899 Disorder of bone, unspecified: Secondary | ICD-10-CM | POA: Diagnosis not present

## 2014-01-06 DIAGNOSIS — E291 Testicular hypofunction: Secondary | ICD-10-CM | POA: Diagnosis not present

## 2014-02-01 ENCOUNTER — Other Ambulatory Visit: Payer: Self-pay | Admitting: Dermatology

## 2014-02-01 DIAGNOSIS — C44319 Basal cell carcinoma of skin of other parts of face: Secondary | ICD-10-CM | POA: Diagnosis not present

## 2014-02-01 DIAGNOSIS — D1801 Hemangioma of skin and subcutaneous tissue: Secondary | ICD-10-CM | POA: Diagnosis not present

## 2014-02-01 DIAGNOSIS — L570 Actinic keratosis: Secondary | ICD-10-CM | POA: Diagnosis not present

## 2014-02-08 ENCOUNTER — Other Ambulatory Visit: Payer: Self-pay | Admitting: Dermatology

## 2014-02-08 DIAGNOSIS — C44319 Basal cell carcinoma of skin of other parts of face: Secondary | ICD-10-CM | POA: Diagnosis not present

## 2014-02-15 DIAGNOSIS — N138 Other obstructive and reflux uropathy: Secondary | ICD-10-CM | POA: Diagnosis not present

## 2014-02-15 DIAGNOSIS — N401 Enlarged prostate with lower urinary tract symptoms: Secondary | ICD-10-CM | POA: Diagnosis not present

## 2014-02-15 DIAGNOSIS — E291 Testicular hypofunction: Secondary | ICD-10-CM | POA: Diagnosis not present

## 2014-02-15 DIAGNOSIS — N139 Obstructive and reflux uropathy, unspecified: Secondary | ICD-10-CM | POA: Diagnosis not present

## 2014-03-18 DIAGNOSIS — E291 Testicular hypofunction: Secondary | ICD-10-CM | POA: Diagnosis not present

## 2014-03-25 DIAGNOSIS — E291 Testicular hypofunction: Secondary | ICD-10-CM | POA: Diagnosis not present

## 2014-03-25 DIAGNOSIS — N401 Enlarged prostate with lower urinary tract symptoms: Secondary | ICD-10-CM | POA: Diagnosis not present

## 2014-04-20 DIAGNOSIS — J302 Other seasonal allergic rhinitis: Secondary | ICD-10-CM | POA: Diagnosis not present

## 2014-04-20 DIAGNOSIS — Z23 Encounter for immunization: Secondary | ICD-10-CM | POA: Diagnosis not present

## 2014-04-20 DIAGNOSIS — F4321 Adjustment disorder with depressed mood: Secondary | ICD-10-CM | POA: Diagnosis not present

## 2014-04-20 DIAGNOSIS — I10 Essential (primary) hypertension: Secondary | ICD-10-CM | POA: Diagnosis not present

## 2014-04-20 DIAGNOSIS — R51 Headache: Secondary | ICD-10-CM | POA: Diagnosis not present

## 2014-04-20 DIAGNOSIS — E291 Testicular hypofunction: Secondary | ICD-10-CM | POA: Diagnosis not present

## 2014-04-20 DIAGNOSIS — R630 Anorexia: Secondary | ICD-10-CM | POA: Diagnosis not present

## 2014-04-20 DIAGNOSIS — E785 Hyperlipidemia, unspecified: Secondary | ICD-10-CM | POA: Diagnosis not present

## 2014-06-21 DIAGNOSIS — M2041 Other hammer toe(s) (acquired), right foot: Secondary | ICD-10-CM | POA: Diagnosis not present

## 2014-06-21 DIAGNOSIS — M25571 Pain in right ankle and joints of right foot: Secondary | ICD-10-CM | POA: Diagnosis not present

## 2014-06-21 DIAGNOSIS — L608 Other nail disorders: Secondary | ICD-10-CM | POA: Diagnosis not present

## 2014-06-21 DIAGNOSIS — M2011 Hallux valgus (acquired), right foot: Secondary | ICD-10-CM | POA: Diagnosis not present

## 2014-07-06 ENCOUNTER — Other Ambulatory Visit: Payer: Self-pay | Admitting: *Deleted

## 2014-07-08 ENCOUNTER — Other Ambulatory Visit (HOSPITAL_BASED_OUTPATIENT_CLINIC_OR_DEPARTMENT_OTHER): Payer: Medicare Other

## 2014-07-08 DIAGNOSIS — C8299 Follicular lymphoma, unspecified, extranodal and solid organ sites: Secondary | ICD-10-CM | POA: Diagnosis not present

## 2014-07-08 DIAGNOSIS — Z8572 Personal history of non-Hodgkin lymphomas: Secondary | ICD-10-CM

## 2014-07-08 DIAGNOSIS — C8290 Follicular lymphoma, unspecified, unspecified site: Secondary | ICD-10-CM

## 2014-07-08 LAB — LACTATE DEHYDROGENASE (CC13): LDH: 155 U/L (ref 125–245)

## 2014-07-08 LAB — COMPREHENSIVE METABOLIC PANEL (CC13)
ALT: 13 U/L (ref 0–55)
AST: 16 U/L (ref 5–34)
Albumin: 4 g/dL (ref 3.5–5.0)
Alkaline Phosphatase: 80 U/L (ref 40–150)
Anion Gap: 7 mEq/L (ref 3–11)
BUN: 16.9 mg/dL (ref 7.0–26.0)
CO2: 30 mEq/L — ABNORMAL HIGH (ref 22–29)
Calcium: 8.9 mg/dL (ref 8.4–10.4)
Chloride: 103 mEq/L (ref 98–109)
Creatinine: 0.9 mg/dL (ref 0.7–1.3)
EGFR: 84 mL/min/{1.73_m2} — ABNORMAL LOW (ref >90)
Glucose: 91 mg/dl (ref 70–140)
Potassium: 3.9 mEq/L (ref 3.5–5.1)
Sodium: 141 mEq/L (ref 136–145)
Total Bilirubin: 0.65 mg/dL (ref 0.20–1.20)
Total Protein: 6.5 g/dL (ref 6.4–8.3)

## 2014-07-08 LAB — CBC WITH DIFFERENTIAL/PLATELET
BASO%: 1.4 % (ref 0.0–2.0)
Basophils Absolute: 0.1 10*3/uL (ref 0.0–0.1)
EOS%: 2.7 % (ref 0.0–7.0)
Eosinophils Absolute: 0.2 10*3/uL (ref 0.0–0.5)
HCT: 38.7 % (ref 38.4–49.9)
HGB: 12.9 g/dL — ABNORMAL LOW (ref 13.0–17.1)
LYMPH%: 21.3 % (ref 14.0–49.0)
MCH: 31 pg (ref 27.2–33.4)
MCHC: 33.3 g/dL (ref 32.0–36.0)
MCV: 93.2 fL (ref 79.3–98.0)
MONO#: 0.5 10*3/uL (ref 0.1–0.9)
MONO%: 7.9 % (ref 0.0–14.0)
NEUT#: 3.9 10*3/uL (ref 1.5–6.5)
NEUT%: 66.7 % (ref 39.0–75.0)
Platelets: 215 10*3/uL (ref 140–400)
RBC: 4.15 10*6/uL — ABNORMAL LOW (ref 4.20–5.82)
RDW: 12.8 % (ref 11.0–14.6)
WBC: 5.9 10*3/uL (ref 4.0–10.3)
lymph#: 1.3 10*3/uL (ref 0.9–3.3)

## 2014-07-08 LAB — FERRITIN CHCC: Ferritin: 217 ng/ml (ref 22–316)

## 2014-07-12 LAB — BETA 2 MICROGLOBULIN, SERUM: Beta-2 Microglobulin: 2.03 mg/L (ref <=2.51)

## 2014-07-13 ENCOUNTER — Encounter (HOSPITAL_COMMUNITY): Payer: Self-pay | Admitting: *Deleted

## 2014-07-13 ENCOUNTER — Emergency Department (HOSPITAL_COMMUNITY): Payer: Medicare Other

## 2014-07-13 ENCOUNTER — Inpatient Hospital Stay (HOSPITAL_COMMUNITY): Payer: Medicare Other

## 2014-07-13 ENCOUNTER — Inpatient Hospital Stay (HOSPITAL_COMMUNITY)
Admission: EM | Admit: 2014-07-13 | Discharge: 2014-07-15 | DRG: 287 | Disposition: A | Discharge status: Home / Self Care | Payer: Medicare Other | Attending: Internal Medicine | Admitting: Internal Medicine

## 2014-07-13 DIAGNOSIS — R079 Chest pain, unspecified: Secondary | ICD-10-CM

## 2014-07-13 DIAGNOSIS — I10 Essential (primary) hypertension: Secondary | ICD-10-CM | POA: Diagnosis present

## 2014-07-13 DIAGNOSIS — R931 Abnormal findings on diagnostic imaging of heart and coronary circulation: Principal | ICD-10-CM | POA: Diagnosis present

## 2014-07-13 DIAGNOSIS — F329 Major depressive disorder, single episode, unspecified: Secondary | ICD-10-CM | POA: Diagnosis present

## 2014-07-13 DIAGNOSIS — Z7982 Long term (current) use of aspirin: Secondary | ICD-10-CM

## 2014-07-13 DIAGNOSIS — E785 Hyperlipidemia, unspecified: Secondary | ICD-10-CM | POA: Diagnosis present

## 2014-07-13 DIAGNOSIS — Z951 Presence of aortocoronary bypass graft: Secondary | ICD-10-CM | POA: Diagnosis not present

## 2014-07-13 DIAGNOSIS — Z8572 Personal history of non-Hodgkin lymphomas: Secondary | ICD-10-CM

## 2014-07-13 DIAGNOSIS — F419 Anxiety disorder, unspecified: Secondary | ICD-10-CM | POA: Diagnosis present

## 2014-07-13 DIAGNOSIS — K219 Gastro-esophageal reflux disease without esophagitis: Secondary | ICD-10-CM | POA: Diagnosis present

## 2014-07-13 DIAGNOSIS — R072 Precordial pain: Secondary | ICD-10-CM | POA: Diagnosis not present

## 2014-07-13 DIAGNOSIS — I491 Atrial premature depolarization: Secondary | ICD-10-CM | POA: Diagnosis not present

## 2014-07-13 DIAGNOSIS — I2510 Atherosclerotic heart disease of native coronary artery without angina pectoris: Secondary | ICD-10-CM | POA: Diagnosis present

## 2014-07-13 DIAGNOSIS — R110 Nausea: Secondary | ICD-10-CM | POA: Diagnosis not present

## 2014-07-13 DIAGNOSIS — N400 Enlarged prostate without lower urinary tract symptoms: Secondary | ICD-10-CM | POA: Diagnosis present

## 2014-07-13 DIAGNOSIS — Z79899 Other long term (current) drug therapy: Secondary | ICD-10-CM

## 2014-07-13 DIAGNOSIS — C8290 Follicular lymphoma, unspecified, unspecified site: Secondary | ICD-10-CM | POA: Diagnosis present

## 2014-07-13 DIAGNOSIS — R9439 Abnormal result of other cardiovascular function study: Secondary | ICD-10-CM | POA: Clinically undetermined

## 2014-07-13 DIAGNOSIS — R0789 Other chest pain: Secondary | ICD-10-CM | POA: Diagnosis not present

## 2014-07-13 LAB — CBC WITH DIFFERENTIAL/PLATELET
Basophils Absolute: 0 10*3/uL (ref 0.0–0.1)
Basophils Relative: 0 % (ref 0–1)
Eosinophils Absolute: 0.1 10*3/uL (ref 0.0–0.7)
Eosinophils Relative: 1 % (ref 0–5)
HCT: 33.5 % — ABNORMAL LOW (ref 39.0–52.0)
Hemoglobin: 11.3 g/dL — ABNORMAL LOW (ref 13.0–17.0)
Lymphocytes Relative: 5 % — ABNORMAL LOW (ref 12–46)
Lymphs Abs: 0.5 10*3/uL — ABNORMAL LOW (ref 0.7–4.0)
MCH: 31.5 pg (ref 26.0–34.0)
MCHC: 33.7 g/dL (ref 30.0–36.0)
MCV: 93.3 fL (ref 78.0–100.0)
Monocytes Absolute: 0.8 10*3/uL (ref 0.1–1.0)
Monocytes Relative: 8 % (ref 3–12)
Neutro Abs: 8.5 10*3/uL — ABNORMAL HIGH (ref 1.7–7.7)
Neutrophils Relative %: 86 % — ABNORMAL HIGH (ref 43–77)
Platelets: 149 10*3/uL — ABNORMAL LOW (ref 150–400)
RBC: 3.59 MIL/uL — ABNORMAL LOW (ref 4.22–5.81)
RDW: 12.7 % (ref 11.5–15.5)
WBC: 9.9 10*3/uL (ref 4.0–10.5)

## 2014-07-13 LAB — TROPONIN I
Troponin I: <0.03 ng/mL (ref <0.031)
Troponin I: <0.03 ng/mL (ref <0.031)
Troponin I: <0.03 ng/mL (ref <0.031)

## 2014-07-13 LAB — COMPREHENSIVE METABOLIC PANEL
ALT: 18 U/L (ref 0–53)
AST: 21 U/L (ref 0–37)
Albumin: 3.4 g/dL — ABNORMAL LOW (ref 3.5–5.2)
Alkaline Phosphatase: 60 U/L (ref 39–117)
Anion gap: 6 (ref 5–15)
BUN: 13 mg/dL (ref 6–23)
CO2: 30 mmol/L (ref 19–32)
Calcium: 8.7 mg/dL (ref 8.4–10.5)
Chloride: 107 mmol/L (ref 96–112)
Creatinine, Ser: 1.05 mg/dL (ref 0.50–1.35)
GFR calc Af Amer: 82 mL/min — ABNORMAL LOW (ref >90)
GFR calc non Af Amer: 71 mL/min — ABNORMAL LOW (ref >90)
Glucose, Bld: 112 mg/dL — ABNORMAL HIGH (ref 70–99)
Potassium: 3.9 mmol/L (ref 3.5–5.1)
Sodium: 143 mmol/L (ref 135–145)
Total Bilirubin: 0.7 mg/dL (ref 0.3–1.2)
Total Protein: 5.5 g/dL — ABNORMAL LOW (ref 6.0–8.3)

## 2014-07-13 LAB — CBC
HCT: 38.3 % — ABNORMAL LOW (ref 39.0–52.0)
Hemoglobin: 13.3 g/dL (ref 13.0–17.0)
MCH: 31.7 pg (ref 26.0–34.0)
MCHC: 34.7 g/dL (ref 30.0–36.0)
MCV: 91.2 fL (ref 78.0–100.0)
Platelets: 191 10*3/uL (ref 150–400)
RBC: 4.2 MIL/uL — ABNORMAL LOW (ref 4.22–5.81)
RDW: 12.7 % (ref 11.5–15.5)
WBC: 13.8 10*3/uL — ABNORMAL HIGH (ref 4.0–10.5)

## 2014-07-13 LAB — BASIC METABOLIC PANEL
Anion gap: 6 (ref 5–15)
BUN: 12 mg/dL (ref 6–23)
CO2: 29 mmol/L (ref 19–32)
Calcium: 9 mg/dL (ref 8.4–10.5)
Chloride: 107 mmol/L (ref 96–112)
Creatinine, Ser: 1.1 mg/dL (ref 0.50–1.35)
GFR calc Af Amer: 78 mL/min — ABNORMAL LOW (ref >90)
GFR calc non Af Amer: 67 mL/min — ABNORMAL LOW (ref >90)
Glucose, Bld: 100 mg/dL — ABNORMAL HIGH (ref 70–99)
Potassium: 3.7 mmol/L (ref 3.5–5.1)
Sodium: 142 mmol/L (ref 135–145)

## 2014-07-13 LAB — OCCULT BLOOD X 1 CARD TO LAB, STOOL: Fecal Occult Bld: NEGATIVE

## 2014-07-13 LAB — LIPASE, BLOOD: Lipase: 93 U/L — ABNORMAL HIGH (ref 11–59)

## 2014-07-13 LAB — I-STAT TROPONIN, ED: Troponin i, poc: 0 ng/mL (ref 0.00–0.08)

## 2014-07-13 MED ORDER — SODIUM CHLORIDE 0.9 % IJ SOLN
3.0000 mL | Freq: Two times a day (BID) | INTRAMUSCULAR | Status: DC
  Administered 2014-07-13 (×2): 3 mL via INTRAVENOUS

## 2014-07-13 MED ORDER — ONDANSETRON HCL 4 MG PO TABS: 4.0000 mg | ORAL_TABLET | Freq: Four times a day (QID) | ORAL | Status: DC | PRN

## 2014-07-13 MED ORDER — SODIUM CHLORIDE 0.9 % IV SOLN
1.0000 mL/kg/h | INTRAVENOUS | Status: DC
  Administered 2014-07-13 – 2014-07-14 (×2): 1 mL/kg/h via INTRAVENOUS

## 2014-07-13 MED ORDER — TECHNETIUM TC 99M SESTAMIBI GENERIC - CARDIOLITE
30.0000 | Freq: Once | INTRAVENOUS | Status: AC | PRN
  Administered 2014-07-13: 30 via INTRAVENOUS

## 2014-07-13 MED ORDER — FLUTICASONE PROPIONATE 50 MCG/ACT NA SUSP
2.0000 | Freq: Every day | NASAL | Status: DC | PRN
  Filled 2014-07-13: qty 16

## 2014-07-13 MED ORDER — SODIUM CHLORIDE 0.9 % IJ SOLN
3.0000 mL | Freq: Two times a day (BID) | INTRAMUSCULAR | Status: DC
  Administered 2014-07-13 – 2014-07-14 (×3): 3 mL via INTRAVENOUS

## 2014-07-13 MED ORDER — ENOXAPARIN SODIUM 40 MG/0.4ML ~~LOC~~ SOLN
40.0000 mg | SUBCUTANEOUS | Status: DC
Start: 2014-07-13 — End: 2014-07-13
  Administered 2014-07-13: 40 mg via SUBCUTANEOUS
  Filled 2014-07-13: qty 0.4

## 2014-07-13 MED ORDER — METOPROLOL SUCCINATE ER 50 MG PO TB24
50.0000 mg | ORAL_TABLET | Freq: Every day | ORAL | Status: DC
  Filled 2014-07-13: qty 1

## 2014-07-13 MED ORDER — SODIUM CHLORIDE 0.9 % IJ SOLN: 3.0000 mL | INTRAMUSCULAR | Status: DC | PRN

## 2014-07-13 MED ORDER — REGADENOSON 0.4 MG/5ML IV SOLN
0.4000 mg | Freq: Once | INTRAVENOUS | Status: AC
  Administered 2014-07-13: 0.4 mg via INTRAVENOUS
  Filled 2014-07-13: qty 5

## 2014-07-13 MED ORDER — ASPIRIN 325 MG PO TABS: 325.0000 mg | ORAL_TABLET | Freq: Every day | ORAL | Status: DC

## 2014-07-13 MED ORDER — ONDANSETRON HCL 4 MG/2ML IJ SOLN: 4.0000 mg | Freq: Four times a day (QID) | INTRAMUSCULAR | Status: DC | PRN

## 2014-07-13 MED ORDER — EZETIMIBE 10 MG PO TABS
10.0000 mg | ORAL_TABLET | Freq: Every day | ORAL | Status: DC
  Administered 2014-07-13 – 2014-07-15 (×3): 10 mg via ORAL
  Filled 2014-07-13 (×3): qty 1

## 2014-07-13 MED ORDER — ROSUVASTATIN CALCIUM 20 MG PO TABS
20.0000 mg | ORAL_TABLET | Freq: Every day | ORAL | Status: DC
  Administered 2014-07-13 – 2014-07-15 (×3): 20 mg via ORAL
  Filled 2014-07-13 (×3): qty 1

## 2014-07-13 MED ORDER — ACETAMINOPHEN 325 MG PO TABS
650.0000 mg | ORAL_TABLET | Freq: Four times a day (QID) | ORAL | Status: DC | PRN
  Administered 2014-07-14: 650 mg via ORAL
  Filled 2014-07-13: qty 2

## 2014-07-13 MED ORDER — ASPIRIN EC 325 MG PO TBEC
325.0000 mg | DELAYED_RELEASE_TABLET | Freq: Every day | ORAL | Status: DC
  Administered 2014-07-13 – 2014-07-15 (×3): 325 mg via ORAL
  Filled 2014-07-13 (×3): qty 1

## 2014-07-13 MED ORDER — SODIUM CHLORIDE 0.9 % IJ SOLN: 3.0000 mL | Freq: Two times a day (BID) | INTRAMUSCULAR | Status: DC

## 2014-07-13 MED ORDER — ACETAMINOPHEN 650 MG RE SUPP: 650.0000 mg | Freq: Four times a day (QID) | RECTAL | Status: DC | PRN

## 2014-07-13 MED ORDER — REGADENOSON 0.4 MG/5ML IV SOLN
INTRAVENOUS | Status: AC
  Administered 2014-07-13: 0.4 mg via INTRAVENOUS
  Filled 2014-07-13: qty 5

## 2014-07-13 MED ORDER — SODIUM CHLORIDE 0.9 % IV SOLN: 250.0000 mL | INTRAVENOUS | Status: DC | PRN

## 2014-07-13 MED ORDER — MORPHINE SULFATE 2 MG/ML IJ SOLN: 1.0000 mg | INTRAMUSCULAR | Status: DC | PRN

## 2014-07-13 MED ORDER — ENOXAPARIN SODIUM 40 MG/0.4ML ~~LOC~~ SOLN: 40.0000 mg | SUBCUTANEOUS | Status: DC

## 2014-07-13 MED ORDER — PANTOPRAZOLE SODIUM 40 MG PO TBEC
40.0000 mg | DELAYED_RELEASE_TABLET | Freq: Every day | ORAL | Status: DC
  Administered 2014-07-13 – 2014-07-15 (×3): 40 mg via ORAL
  Filled 2014-07-13 (×2): qty 1

## 2014-07-13 MED ORDER — NITROGLYCERIN 0.4 MG SL SUBL: 0.4000 mg | SUBLINGUAL_TABLET | SUBLINGUAL | Status: DC | PRN

## 2014-07-13 MED ORDER — TECHNETIUM TC 99M SESTAMIBI GENERIC - CARDIOLITE
10.0000 | Freq: Once | INTRAVENOUS | Status: AC | PRN
  Administered 2014-07-13: 10 via INTRAVENOUS

## 2014-07-13 MED ORDER — DICYCLOMINE HCL 20 MG PO TABS
20.0000 mg | ORAL_TABLET | Freq: Four times a day (QID) | ORAL | Status: DC | PRN
  Filled 2014-07-13: qty 1

## 2014-07-13 NOTE — Progress Notes (Signed)
Utilization review completed.  

## 2014-07-13 NOTE — ED Notes (Signed)
Portable xray at bedside.

## 2014-07-13 NOTE — Progress Notes (Signed)
Reviewed Myoview results with Dr Stanford Breed. Plan is for cath tomorrow. The patient understands that risks included but are not limited to stroke (1 in 1000), death (1 in 56), kidney failure [usually temporary] (1 in 500), bleeding (1 in 200), allergic reaction [possibly serious] (1 in 200).  The patient understands and agrees to proceed.   Kerin Ransom PA-C 07/13/2014 4:35 PM

## 2014-07-13 NOTE — ED Notes (Signed)
Pt c/o dull epigastric pain and chest pain "like my heart was going to explode" starting prior to arrival. Pt was watching TV when he felt a sudden onset of nausea and abdominal pain. Pt reports going to the sink due to nausea and noticed his abdomen was rigid. Family hx of aneurysms and MI. Pt pale; hx of transfusion with CABG. Also c/o weakness

## 2014-07-13 NOTE — ED Notes (Signed)
Pt to ED from home c/o chest pain. Pt reports watching TV when pt felt a sudden onset of epigastric and low chest pain radiating into back and shoulder blades. Pt reports Nausea and rigid abdomen during episode. Pt took 324 mg ASA at home prior to EMS. EMS gave nitro x 3 with relief of pain, also given 4mg  zofran for nausea.

## 2014-07-13 NOTE — ED Provider Notes (Signed)
CSN: [id number]     Arrival date & time 07/13/14  0136 History  This chart was scribed for Willie Fines, MD by Chester Holstein, ED Scribe. This patient was seen in room A10C/A10C and the patient's care was started at 2:03 AM.      Chief Complaint  Patient presents with  . Chest Pain    Patient is a 69 y.o. male presenting with chest pain. The history is provided by the patient. No language interpreter was used.  Chest Pain   HPI Comments: HPI Comments: Willie Garcia is a 69 y.o. male brought in by ambulance, with PMHx of CAD who presents to the Emergency Department complaining of chest pain, like severe heartburn, in lower substernal region with onset just pirior to arrival. He said it felt like his chest was going to explode. Pt states he was watching tv at onset. Pt notes associated nausea, sweating and SOB. Pt took 2 ASA and called EMS. Pt was given 3 NTG by EMS with complete relief. Pain lasted about 10 minutes total. Pt denies having had pain similar before. He rates it 9/10 at its worst, 0/10 presently.  Past Medical History  Diagnosis Date  . Allergic rhinitis   . Non Hodgkin's lymphoma   . CAD (coronary artery disease)   . Depression   . GERD (gastroesophageal reflux disease)   . Hyperlipidemia   . HTN (hypertension)   . Osteopenia   . Anemia   . Anxiety   . Scoliosis   . BPH (benign prostatic hyperplasia)   . Personal history of colonic adenomas 04/04/2010   Past Surgical History  Procedure Laterality Date  . Coronary artery bypass graft    . Rhinoplasty    . Hernia repair    . Colonoscopy    . Upper gastrointestinal endoscopy    . Cervical spine surgery      C4-C6   Family History  Problem Relation Age of Onset  . Colonic polyp    . Diabetes    . Heart disease    . Colon cancer Neg Hx   . Stomach cancer Neg Hx   . Esophageal cancer Neg Hx   . Rectal cancer Neg Hx    History  Substance Use Topics  . Smoking status: Never Smoker   . Smokeless tobacco:  Never Used  . Alcohol Use: No    Review of Systems  Cardiovascular: Positive for chest pain.  All other systems reviewed and are negative.     Allergies  Review of patient's allergies indicates no known allergies.  Home Medications   Prior to Admission medications   Medication Sig Start Date End Date Taking? Authorizing Provider  aspirin 325 MG tablet Take 325 mg by mouth daily.      Historical Provider, MD  cholecalciferol (VITAMIN D) 400 UNITS TABS Take 400 Units by mouth daily.     Historical Provider, MD  Cyanocobalamin (VITAMIN B-12 IJ) Inject as directed every 30 (thirty) days.    Historical Provider, MD  ezetimibe (ZETIA) 10 MG tablet Take 10 mg by mouth daily.    Historical Provider, MD  fluticasone (FLONASE) 50 MCG/ACT nasal spray Place 2 sprays into the nose daily.    Historical Provider, MD  folic acid (FOLVITE) 1 MG tablet Take 1 mg by mouth daily.      Historical Provider, MD  meloxicam (MOBIC) 15 MG tablet Take 15 mg by mouth daily as needed. 01/02/14   Kristeen Miss, MD  metoprolol (TOPROL-XL) 50  MG 24 hr tablet Take 50 mg by mouth daily.      Historical Provider, MD  Multiple Vitamin (MULTIVITAMIN) capsule Take 1 capsule by mouth daily.      Historical Provider, MD  rosuvastatin (CRESTOR) 20 MG tablet Take 20 mg by mouth daily.      Historical Provider, MD  testosterone cypionate (DEPOTESTOTERONE CYPIONATE) 200 MG/ML injection AS DIRECTED 08/27/12   Historical Provider, MD   BP 115/61 mmHg  Pulse 76  Temp(Src) 98.6 F (37 C) (Oral)  SpO2 98%   Physical Exam  Nursing note and vitals reviewed. General: Well-developed, well-nourished male in no acute distress; appearance consistent with age of record HENT: normocephalic; atraumatic Eyes: pupils equal, round and reactive to light; extraocular muscles intact Neck: supple Heart: regular rate and rhythm; no murmur Lungs: clear to auscultation bilaterally Abdomen: soft; nondistended; nontender; no masses or  hepatosplenomegaly; bowel sounds present Extremities: No deformity; full range of motion; pulses normal Neurologic: Awake, alert and oriented; motor function intact in all extremities and symmetric; no facial droop Skin: Warm and dry Psychiatric: Normal mood and affect    ED Course  Procedures (including critical care time) DIAGNOSTIC STUDIES: Oxygen Saturation is 98% on room air, normal by my interpretation.    COORDINATION OF CARE: 2:10 AM Discussed treatment plan with patient at beside, the patient agrees with the plan and has no further questions at this time.     EKG Interpretation   Date/Time:  Tuesday July 13 2014 01:51:43 EST Ventricular Rate:  69 PR Interval:  154 QRS Duration: 94 QT Interval:  481 QTC Calculation: 515 R Axis:   81 Text Interpretation:  Sinus rhythm Atrial premature complexes Anteroseptal  infarct, age indeterminate Prolonged QT interval Ectopy not seen  previously Confirmed by Florina Ou  MD, Jenny Reichmann ([id number]) on 07/13/2014 1:53:57 AM      MDM   Nursing notes and vitals signs, including pulse oximetry, reviewed.  Summary of this visit's results, reviewed by myself:  Labs:  Results for orders placed or performed during the hospital encounter of 07/13/14 (from the past 24 hour(s))  CBC     Status: Abnormal   Collection Time: 07/13/14  1:48 AM  Result Value Ref Range   WBC 13.8 (H) 4.0 - 10.5 K/uL   RBC 4.20 (L) 4.22 - 5.81 MIL/uL   Hemoglobin 13.3 13.0 - 17.0 g/dL   HCT 38.3 (L) 39.0 - 52.0 %   MCV 91.2 78.0 - 100.0 fL   MCH 31.7 26.0 - 34.0 pg   MCHC 34.7 30.0 - 36.0 g/dL   RDW 12.7 11.5 - 15.5 %   Platelets 191 150 - 400 K/uL  Basic metabolic panel     Status: Abnormal   Collection Time: 07/13/14  1:48 AM  Result Value Ref Range   Sodium 142 135 - 145 mmol/L   Potassium 3.7 3.5 - 5.1 mmol/L   Chloride 107 96 - 112 mmol/L   CO2 29 19 - 32 mmol/L   Glucose, Bld 100 (H) 70 - 99 mg/dL   BUN 12 6 - 23 mg/dL   Creatinine, Ser 1.10 0.50 - 1.35  mg/dL   Calcium 9.0 8.4 - 10.5 mg/dL   GFR calc non Af Amer 67 (L) >90 mL/min   GFR calc Af Amer 78 (L) >90 mL/min   Anion gap 6 5 - 15  I-stat troponin, ED (not at [facility])     Status: None   Collection Time: 07/13/14  1:56 AM  Result Value Ref  Range   Troponin i, poc 0.00 0.00 - 0.08 ng/mL   Comment 3            Imaging Studies: Dg Chest Port 1 View  07/13/2014   CLINICAL DATA:  Acute onset of lower chest and upper abdominal pain. Initial encounter.  EXAM: PORTABLE CHEST - 1 VIEW  COMPARISON:  CT of the chest performed 05/15/2010  FINDINGS: The lungs are well-aerated and clear. There is no evidence of focal opacification, pleural effusion or pneumothorax.  The cardiomediastinal silhouette is normal in size. The patient is status post median sternotomy, with evidence of prior CABG. No acute osseous abnormalities are seen.  IMPRESSION: No acute cardiopulmonary process seen.   Electronically Signed   By: Garald Balding M.D.   On: 07/13/2014 02:21    I personally performed the services described in this documentation, which was scribed in my presence. The recorded information has been reviewed and is accurate.    Willie Fines, MD 07/13/14 [phone]

## 2014-07-13 NOTE — Consult Note (Signed)
Primary cardiologist: Dr Ladona Ridgel  HPI: 69 year old male with past medical history of coronary artery disease status post coronary artery bypass graft for evaluation of chest pain. Abdominal ultrasound in 2012 showed no aneurysm. Echocardiogram in 2012 showed normal LV function, mild mitral regurgitation, mild right atrial and right ventricular enlargement. Patient typically does not have dyspnea on exertion, orthopnea, PND, pedal edema, exertional chest pain or syncope. Last evening he developed upper abdominal pain/epigastric pain. He described it as excruciating and subsequently radiated to his chest. He was short of breath and there was nausea. He did have significant belching. He felt the need to have a bowel movement. Ultimately his symptoms resolved after EMS was called with nitroglycerin. Total duration of pain 20 minutes. Cardiology asked to evaluate.  Medications Prior to Admission  Medication Sig Dispense Refill  . aspirin 325 MG tablet Take 325 mg by mouth daily.      . Cholecalciferol (VITAMIN D3) 2000 UNITS TABS Take 2,000 Units by mouth daily.    . Cyanocobalamin (VITAMIN B-12 IJ) Inject 1 Applicatorful as directed every 30 (thirty) days.     Marland Kitchen dicyclomine (BENTYL) 20 MG tablet Take 20 mg by mouth every 6 (six) hours as needed for spasms.    Marland Kitchen ezetimibe (ZETIA) 10 MG tablet Take 10 mg by mouth daily.    . fluticasone (FLONASE) 50 MCG/ACT nasal spray Place 2 sprays into both nostrils daily as needed for allergies or rhinitis.     . folic acid (FOLVITE) 1 MG tablet Take 1 mg by mouth daily.      Marland Kitchen ibuprofen (ADVIL,MOTRIN) 200 MG tablet Take 400 mg by mouth every 6 (six) hours as needed for moderate pain.    Marland Kitchen loratadine (CLARITIN) 10 MG tablet Take 10 mg by mouth daily as needed for allergies.    . metoprolol (TOPROL-XL) 50 MG 24 hr tablet Take 50 mg by mouth daily.      . naproxen sodium (ANAPROX) 220 MG tablet Take 440 mg by mouth 2 (two) times daily as needed (pain).    .  rosuvastatin (CRESTOR) 20 MG tablet Take 20 mg by mouth daily.      Marland Kitchen testosterone cypionate (DEPOTESTOTERONE CYPIONATE) 200 MG/ML injection Inject 50 mg into the muscle once a week. AS DIRECTED    . vitamin C (ASCORBIC ACID) 500 MG tablet Take 500 mg by mouth daily.    . cholecalciferol (VITAMIN D) 400 UNITS TABS Take 400 Units by mouth daily.     . meloxicam (MOBIC) 15 MG tablet Take 15 mg by mouth daily as needed.    . Multiple Vitamin (MULTIVITAMIN) capsule Take 1 capsule by mouth daily.        No Known Allergies   Past Medical History  Diagnosis Date  . Allergic rhinitis   . Non Hodgkin's lymphoma   . CAD (coronary artery disease)   . Depression   . GERD (gastroesophageal reflux disease)   . Hyperlipidemia   . HTN (hypertension)   . Osteopenia   . Anemia   . Anxiety   . Scoliosis   . BPH (benign prostatic hyperplasia)   . Personal history of colonic adenomas 04/04/2010    Past Surgical History  Procedure Laterality Date  . Coronary artery bypass graft    . Rhinoplasty    . Hernia repair    . Colonoscopy    . Upper gastrointestinal endoscopy    . Cervical spine surgery      C4-C6    History  Social History  . Marital Status: Married    Spouse Name: N/A    Number of Children: N/A  . Years of Education: N/A   Occupational History  . Retired    Social History Main Topics  . Smoking status: Never Smoker   . Smokeless tobacco: Never Used  . Alcohol Use: No  . Drug Use: No  . Sexual Activity: Not on file   Other Topics Concern  . Not on file   Social History Narrative   Married, wife is a Marine scientist, no children   Retired Microbiologist, flies as a hobby   2 caffeinated beverages daily    Family History  Problem Relation Age of Onset  . Colonic polyp    . Diabetes    . Heart disease    . Colon cancer Neg Hx   . Stomach cancer Neg Hx   . Esophageal cancer Neg Hx   . Rectal cancer Neg Hx     ROS:  no fevers or chills, productive cough,  hemoptysis, dysphasia, odynophagia, melena, hematochezia, dysuria, hematuria, rash, seizure activity, orthopnea, PND, pedal edema, claudication. Remaining systems are negative.  Physical Exam:   Blood pressure 98/60, pulse 70, temperature 97.7 F (36.5 C), temperature source Oral, resp. rate 16, height $RemoveBe'5\' 10"'[id number]$  (1.778 m), weight 147 lb 0.8 oz (66.7 kg), SpO2 99 %.  General:  Well developed/well nourished in NAD Skin warm/dry Patient not depressed No peripheral clubbing Back-normal HEENT-normal/normal eyelids Neck supple/normal carotid upstroke bilaterally; no bruits; no JVD; no thyromegaly chest - CTA/ normal expansion CV - RRR/normal S1 and S2; no murmurs, rubs or gallops;  PMI nondisplaced Abdomen -mild epigastric tenderness to palpation/ND, no HSM, no mass, + bowel sounds, no bruit 2+ femoral pulses, no bruits Ext-no edema, chords, 2+ DP Neuro-grossly nonfocal  ECG sinus rhythm with PACs. No ST changes.  Results for orders placed or performed during the hospital encounter of 07/13/14 (from the past 48 hour(s))  CBC     Status: Abnormal   Collection Time: 07/13/14  1:48 AM  Result Value Ref Range   WBC 13.8 (H) 4.0 - 10.5 K/uL   RBC 4.20 (L) 4.22 - 5.81 MIL/uL   Hemoglobin 13.3 13.0 - 17.0 g/dL   HCT 38.3 (L) 39.0 - 52.0 %   MCV 91.2 78.0 - 100.0 fL   MCH 31.7 26.0 - 34.0 pg   MCHC 34.7 30.0 - 36.0 g/dL   RDW 12.7 11.5 - 15.5 %   Platelets 191 150 - 400 K/uL  Basic metabolic panel     Status: Abnormal   Collection Time: 07/13/14  1:48 AM  Result Value Ref Range   Sodium 142 135 - 145 mmol/L   Potassium 3.7 3.5 - 5.1 mmol/L   Chloride 107 96 - 112 mmol/L   CO2 29 19 - 32 mmol/L   Glucose, Bld 100 (H) 70 - 99 mg/dL   BUN 12 6 - 23 mg/dL   Creatinine, Ser 1.10 0.50 - 1.35 mg/dL   Calcium 9.0 8.4 - 10.5 mg/dL   GFR calc non Af Amer 67 (L) >90 mL/min   GFR calc Af Amer 78 (L) >90 mL/min    Comment: (NOTE) The eGFR has been calculated using the CKD EPI equation. This  calculation has not been validated in all clinical situations. eGFR's persistently <90 mL/min signify possible Chronic Kidney Disease.    Anion gap 6 5 - 15  I-stat troponin, ED (not at [facility])     Status: None  Collection Time: 07/13/14  1:56 AM  Result Value Ref Range   Troponin i, poc 0.00 0.00 - 0.08 ng/mL   Comment 3            Comment: Due to the release kinetics of cTnI, a negative result within the first hours of the onset of symptoms does not rule out myocardial infarction with certainty. If myocardial infarction is still suspected, repeat the test at appropriate intervals.   Troponin I (q 6hr x 3)     Status: None   Collection Time: 07/13/14  4:35 AM  Result Value Ref Range   Troponin I <0.03 <0.031 ng/mL    Comment:        NO INDICATION OF MYOCARDIAL INJURY.   Comprehensive metabolic panel     Status: Abnormal   Collection Time: 07/13/14  4:35 AM  Result Value Ref Range   Sodium 143 135 - 145 mmol/L   Potassium 3.9 3.5 - 5.1 mmol/L   Chloride 107 96 - 112 mmol/L   CO2 30 19 - 32 mmol/L   Glucose, Bld 112 (H) 70 - 99 mg/dL   BUN 13 6 - 23 mg/dL   Creatinine, Ser 1.05 0.50 - 1.35 mg/dL   Calcium 8.7 8.4 - 10.5 mg/dL   Total Protein 5.5 (L) 6.0 - 8.3 g/dL   Albumin 3.4 (L) 3.5 - 5.2 g/dL   AST 21 0 - 37 U/L   ALT 18 0 - 53 U/L   Alkaline Phosphatase 60 39 - 117 U/L   Total Bilirubin 0.7 0.3 - 1.2 mg/dL   GFR calc non Af Amer 71 (L) >90 mL/min   GFR calc Af Amer 82 (L) >90 mL/min    Comment: (NOTE) The eGFR has been calculated using the CKD EPI equation. This calculation has not been validated in all clinical situations. eGFR's persistently <90 mL/min signify possible Chronic Kidney Disease.    Anion gap 6 5 - 15  CBC with Differential/Platelet     Status: Abnormal   Collection Time: 07/13/14  4:35 AM  Result Value Ref Range   WBC 9.9 4.0 - 10.5 K/uL   RBC 3.59 (L) 4.22 - 5.81 MIL/uL   Hemoglobin 11.3 (L) 13.0 - 17.0 g/dL   HCT 33.5 (L) 39.0 - 52.0 %    MCV 93.3 78.0 - 100.0 fL   MCH 31.5 26.0 - 34.0 pg   MCHC 33.7 30.0 - 36.0 g/dL   RDW 12.7 11.5 - 15.5 %   Platelets 149 (L) 150 - 400 K/uL   Neutrophils Relative % 86 (H) 43 - 77 %   Neutro Abs 8.5 (H) 1.7 - 7.7 K/uL   Lymphocytes Relative 5 (L) 12 - 46 %   Lymphs Abs 0.5 (L) 0.7 - 4.0 K/uL   Monocytes Relative 8 3 - 12 %   Monocytes Absolute 0.8 0.1 - 1.0 K/uL   Eosinophils Relative 1 0 - 5 %   Eosinophils Absolute 0.1 0.0 - 0.7 K/uL   Basophils Relative 0 0 - 1 %   Basophils Absolute 0.0 0.0 - 0.1 K/uL  Troponin I (q 6hr x 3)     Status: None   Collection Time: 07/13/14  9:15 AM  Result Value Ref Range   Troponin I <0.03 <0.031 ng/mL    Comment:        NO INDICATION OF MYOCARDIAL INJURY.   Lipase, blood     Status: Abnormal   Collection Time: 07/13/14  9:15 AM  Result Value Ref  Range   Lipase 93 (H) 11 - 59 U/L    Dg Chest Port 1 View  07/13/2014   CLINICAL DATA:  Acute onset of lower chest and upper abdominal pain. Initial encounter.  EXAM: PORTABLE CHEST - 1 VIEW  COMPARISON:  CT of the chest performed 05/15/2010  FINDINGS: The lungs are well-aerated and clear. There is no evidence of focal opacification, pleural effusion or pneumothorax.  The cardiomediastinal silhouette is normal in size. The patient is status post median sternotomy, with evidence of prior CABG. No acute osseous abnormalities are seen.  IMPRESSION: No acute cardiopulmonary process seen.   Electronically Signed   By: Garald Balding M.D.   On: 07/13/2014 02:21    Assessment/Plan 1 chest pain-etiology of symptoms unclear. Description seems more consistent with GI etiology although cannot exclude cardiac. Electrocardiogram showed no ST changes. Enzymes negative. Will arrange nuclear study for risk stratification. Agree with Protonix. 2 coronary artery disease-continue aspirin and statin. 3 hyperlipidemia-continue statin. 4 hypertension-resume Toprol at discharge.  If nuclear study negative patient can be  discharged from a cardiac standpoint and follow-up with Dr. Lovena Le.  Kirk Ruths MD 07/13/2014, 10:39 AM

## 2014-07-13 NOTE — Progress Notes (Signed)
*  PRELIMINARY RESULTS* Echocardiogram 2D Echocardiogram has been performed.  Leavy Cella 07/13/2014, 3:58 PM

## 2014-07-13 NOTE — H&P (Signed)
[facility] History and Physical  Willie Garcia LFY:[id number] DOB: 04-22-1946 DOA: 07/13/2014  Referring physician: ER physician. PCP: Tivis Ringer, MD   Chief Complaint: Chest pain.  HPI: Willie Garcia is a 69 y.o. male with history of CAD status post CABG, hypertension, hyperlipidemia, Follicular lymphoma in remission, dyspepsia has had recent EGD and colonoscopy for iron deficiency presents to the ER because of chest pain. Patient started having chest pain last night while watching TV which was starting from the epigastric area radiating to his retrosternal area. Was pressure-like no associated shortness of breath diaphoresis nausea vomiting or abdominal pain. EMS was called and patient was given aspirin and nitroglycerin sublingual following which patient's symptoms resolved. In the ER EKG and cardiac markers were unremarkable. Patient has been admitted for further management of his chest pain.   Review of Systems: As presented in the history of presenting illness, rest negative.  Past Medical History  Diagnosis Date  . Allergic rhinitis   . Non Hodgkin's lymphoma   . CAD (coronary artery disease)   . Depression   . GERD (gastroesophageal reflux disease)   . Hyperlipidemia   . HTN (hypertension)   . Osteopenia   . Anemia   . Anxiety   . Scoliosis   . BPH (benign prostatic hyperplasia)   . Personal history of colonic adenomas 04/04/2010   Past Surgical History  Procedure Laterality Date  . Coronary artery bypass graft    . Rhinoplasty    . Hernia repair    . Colonoscopy    . Upper gastrointestinal endoscopy    . Cervical spine surgery      C4-C6   Social History:  reports that he has never smoked. He has never used smokeless tobacco. He reports that he does not drink alcohol or use illicit drugs. Where does patient live home. Can patient participate in ADLs? Yes.  No Known Allergies  Family History:  Family History  Problem Relation Age of Onset  .  Colonic polyp    . Diabetes    . Heart disease    . Colon cancer Neg Hx   . Stomach cancer Neg Hx   . Esophageal cancer Neg Hx   . Rectal cancer Neg Hx       Prior to Admission medications   Medication Sig Start Date End Date Taking? Authorizing Provider  aspirin 325 MG tablet Take 325 mg by mouth daily.     Yes Historical Provider, MD  Cholecalciferol (VITAMIN D3) 2000 UNITS TABS Take 2,000 Units by mouth daily.   Yes Historical Provider, MD  Cyanocobalamin (VITAMIN B-12 IJ) Inject 1 Applicatorful as directed every 30 (thirty) days.    Yes Historical Provider, MD  dicyclomine (BENTYL) 20 MG tablet Take 20 mg by mouth every 6 (six) hours as needed for spasms.   Yes Historical Provider, MD  ezetimibe (ZETIA) 10 MG tablet Take 10 mg by mouth daily.   Yes Historical Provider, MD  fluticasone (FLONASE) 50 MCG/ACT nasal spray Place 2 sprays into both nostrils daily as needed for allergies or rhinitis.    Yes Historical Provider, MD  folic acid (FOLVITE) 1 MG tablet Take 1 mg by mouth daily.     Yes Historical Provider, MD  ibuprofen (ADVIL,MOTRIN) 200 MG tablet Take 400 mg by mouth every 6 (six) hours as needed for moderate pain.   Yes Historical Provider, MD  loratadine (CLARITIN) 10 MG tablet Take 10 mg by mouth daily as needed for allergies.   Yes  Historical Provider, MD  metoprolol (TOPROL-XL) 50 MG 24 hr tablet Take 50 mg by mouth daily.     Yes Historical Provider, MD  naproxen sodium (ANAPROX) 220 MG tablet Take 440 mg by mouth 2 (two) times daily as needed (pain).   Yes Historical Provider, MD  rosuvastatin (CRESTOR) 20 MG tablet Take 20 mg by mouth daily.     Yes Historical Provider, MD  testosterone cypionate (DEPOTESTOTERONE CYPIONATE) 200 MG/ML injection Inject 50 mg into the muscle once a week. AS DIRECTED 08/27/12  Yes Historical Provider, MD  vitamin C (ASCORBIC ACID) 500 MG tablet Take 500 mg by mouth daily.   Yes Historical Provider, MD  cholecalciferol (VITAMIN D) 400 UNITS TABS  Take 400 Units by mouth daily.     Historical Provider, MD  meloxicam (MOBIC) 15 MG tablet Take 15 mg by mouth daily as needed. 01/02/14   Kristeen Miss, MD  Multiple Vitamin (MULTIVITAMIN) capsule Take 1 capsule by mouth daily.      Historical Provider, MD    Physical Exam: Filed Vitals:   07/13/14 0142 07/13/14 0149 07/13/14 0215 07/13/14 0245  BP:  115/61 94/81 112/59  Pulse:  76 65 74  Temp:  98.6 F (37 C)    TempSrc:  Oral    Resp:   17 11  SpO2: 96% 98% 100% 100%     General:  Well-developed and nourished.  Eyes: Anicteric no pallor.  ENT: No discharge from the ears eyes nose or mouth.  Neck: No mass felt.  Cardiovascular: S1-S2 heard.  Respiratory: No rhonchi or crepitations.  Abdomen: Soft nontender bowel sounds present.  Skin: No rash.  Musculoskeletal: No edema.  Psychiatric: Appears normal.  Neurologic: Alert awake oriented to time place and person. Moves all extremities.  Labs on Admission:  Basic Metabolic Panel:  Recent Labs Lab 07/08/14 0917 07/13/14 0148  NA 141 142  K 3.9 3.7  CL  --  107  CO2 30* 29  GLUCOSE 91 100*  BUN 16.9 12  CREATININE 0.9 1.10  CALCIUM 8.9 9.0   Liver Function Tests:  Recent Labs Lab 07/08/14 0917  AST 16  ALT 13  ALKPHOS 80  BILITOT 0.65  PROT 6.5  ALBUMIN 4.0   No results for input(s): LIPASE, AMYLASE in the last 168 hours. No results for input(s): AMMONIA in the last 168 hours. CBC:  Recent Labs Lab 07/08/14 0916 07/13/14 0148  WBC 5.9 13.8*  NEUTROABS 3.9  --   HGB 12.9* 13.3  HCT 38.7 38.3*  MCV 93.2 91.2  PLT 215 191   Cardiac Enzymes: No results for input(s): CKTOTAL, CKMB, CKMBINDEX, TROPONINI in the last 168 hours.  BNP (last 3 results) No results for input(s): PROBNP in the last 8760 hours. CBG: No results for input(s): GLUCAP in the last 168 hours.  Radiological Exams on Admission: Dg Chest Port 1 View  07/13/2014   CLINICAL DATA:  Acute onset of lower chest and upper  abdominal pain. Initial encounter.  EXAM: PORTABLE CHEST - 1 VIEW  COMPARISON:  CT of the chest performed 05/15/2010  FINDINGS: The lungs are well-aerated and clear. There is no evidence of focal opacification, pleural effusion or pneumothorax.  The cardiomediastinal silhouette is normal in size. The patient is status post median sternotomy, with evidence of prior CABG. No acute osseous abnormalities are seen.  IMPRESSION: No acute cardiopulmonary process seen.   Electronically Signed   By: Garald Balding M.D.   On: 07/13/2014 02:21    EKG:  Independently reviewed. Normal sinus rhythm. QT is prolonged.  Assessment/Plan Principal Problem:   Chest pain Active Problems:   Essential hypertension   Lymphoma, follicular   Hyperlipidemia   1. Chest pain - given history of CABG concerning for angina. Cycle cardiac markers. Continue aspirin and when necessary nitroglycerin. Patient is chest pain-free at this time. Check 2-D echo. Patient will be kept nothing by mouth in anticipation of possible cardiac procedures. 2. Hypertension - continue home medications. 3. Hyperlipidemia - on statins. 4. History of dyspepsia status post recent EGD requiring dilation. 5. History of lymphoma in remission.   DVT Prophylaxis Lovenox.  Code Status: Full code.  Family Communication: Patient's wife at the bedside.  Disposition Plan: Admit to inpatient.    Liberty Seto N. [facility] Pager [phone].  If 7PM-7AM, please contact night-coverage [email or web address] Password [email or web address] 07/13/2014, 3:24 AM

## 2014-07-13 NOTE — Progress Notes (Addendum)
Patient admitted after midnight. Chart reviewed. Patient examined. Reports his substernal/epigastric discomfort started 4-5 hours after eating out, chicken pot pie and chocolate pie. He reports that his stomach was in spasms and he felt that he might vomit. Also felt an urgent need to have a bowel movement but just passed flatus. Wife reports that he was belching a lot. Patient did not have a recent esophageal dilatation as stated in the H&P. It was a year ago. takes meloxicam, Naprosyn, ibuprofen periodically for back pain. Noncompliant with proton pump inhibitor.  Abdominal exam benign. Coronary artery bypass graft was in 1999. No cardiac catheterization since then per his report. Patient and wife cannot recall when he last had a nuclear study. It may have been 3 years ago but I see no record of this. He sees Dr. Cristopher Peru.  Symptoms sound more GI in nature and have resolved. Will add proton pump inhibitor. Add lipase to blood work. Liver function tests unremarkable. Nevertheless will ask cardiology to comment on whether further cardiac workup indicated and wife requesting to see Dr. Lovena Le, et al.  Will hold toprol in case stress test needed.  Doree Barthel, MD [facility] [email or web address]

## 2014-07-14 ENCOUNTER — Encounter (HOSPITAL_COMMUNITY)
Admission: EM | Disposition: A | Discharge status: Home / Self Care | Payer: Self-pay | Source: Home / Self Care | Attending: Internal Medicine

## 2014-07-14 ENCOUNTER — Encounter (HOSPITAL_COMMUNITY): Payer: Self-pay | Admitting: Interventional Cardiology

## 2014-07-14 DIAGNOSIS — R9439 Abnormal result of other cardiovascular function study: Secondary | ICD-10-CM | POA: Clinically undetermined

## 2014-07-14 DIAGNOSIS — I2510 Atherosclerotic heart disease of native coronary artery without angina pectoris: Secondary | ICD-10-CM

## 2014-07-14 HISTORY — PX: CARDIAC CATHETERIZATION: SHX172

## 2014-07-14 LAB — BASIC METABOLIC PANEL
Anion gap: 8 (ref 5–15)
BUN: 10 mg/dL (ref 6–23)
CO2: 27 mmol/L (ref 19–32)
Calcium: 8.5 mg/dL (ref 8.4–10.5)
Chloride: 104 mmol/L (ref 96–112)
Creatinine, Ser: 0.96 mg/dL (ref 0.50–1.35)
GFR calc Af Amer: >90 mL/min (ref >90)
GFR calc non Af Amer: 83 mL/min — ABNORMAL LOW (ref >90)
Glucose, Bld: 94 mg/dL (ref 70–99)
Potassium: 3.7 mmol/L (ref 3.5–5.1)
Sodium: 139 mmol/L (ref 135–145)

## 2014-07-14 LAB — CBC
HCT: 35.6 % — ABNORMAL LOW (ref 39.0–52.0)
Hemoglobin: 12 g/dL — ABNORMAL LOW (ref 13.0–17.0)
MCH: 30.9 pg (ref 26.0–34.0)
MCHC: 33.7 g/dL (ref 30.0–36.0)
MCV: 91.8 fL (ref 78.0–100.0)
Platelets: 149 10*3/uL — ABNORMAL LOW (ref 150–400)
RBC: 3.88 MIL/uL — ABNORMAL LOW (ref 4.22–5.81)
RDW: 12.8 % (ref 11.5–15.5)
WBC: 4.9 10*3/uL (ref 4.0–10.5)

## 2014-07-14 LAB — PROTIME-INR
INR: 1.04 (ref 0.00–1.49)
Prothrombin Time: 13.7 seconds (ref 11.6–15.2)

## 2014-07-14 LAB — POCT ACTIVATED CLOTTING TIME
Activated Clotting Time: 214 seconds
Activated Clotting Time: 245 seconds

## 2014-07-14 SURGERY — LEFT HEART CATH AND CORS/GRAFTS ANGIOGRAPHY

## 2014-07-14 MED ORDER — ACETAMINOPHEN 325 MG PO TABS: 650.0000 mg | ORAL_TABLET | ORAL | Status: DC | PRN

## 2014-07-14 MED ORDER — ONDANSETRON HCL 4 MG/2ML IJ SOLN: 4.0000 mg | Freq: Four times a day (QID) | INTRAMUSCULAR | Status: DC | PRN

## 2014-07-14 MED ORDER — HEPARIN (PORCINE) IN NACL 2-0.9 UNIT/ML-% IJ SOLN
INTRAMUSCULAR | Status: AC
  Filled 2014-07-14: qty 1500

## 2014-07-14 MED ORDER — MIDAZOLAM HCL 2 MG/2ML IJ SOLN
INTRAMUSCULAR | Status: AC
Start: 2014-07-14 — End: 2014-07-14
  Filled 2014-07-14: qty 2

## 2014-07-14 MED ORDER — MIDAZOLAM HCL 2 MG/2ML IJ SOLN
INTRAMUSCULAR | Status: AC
  Filled 2014-07-14: qty 2

## 2014-07-14 MED ORDER — ADENOSINE 12 MG/4ML IV SOLN
12.0000 mL | Freq: Once | INTRAVENOUS | Status: AC
  Administered 2014-07-14: 36 mg via INTRAVENOUS
  Filled 2014-07-14: qty 12

## 2014-07-14 MED ORDER — LIDOCAINE HCL (PF) 1 % IJ SOLN
INTRAMUSCULAR | Status: AC
  Filled 2014-07-14: qty 30

## 2014-07-14 MED ORDER — NITROGLYCERIN 1 MG/10 ML FOR IR/CATH LAB
INTRA_ARTERIAL | Status: AC
  Filled 2014-07-14: qty 10

## 2014-07-14 MED ORDER — HEPARIN SODIUM (PORCINE) 1000 UNIT/ML IJ SOLN
INTRAMUSCULAR | Status: AC
  Filled 2014-07-14: qty 1

## 2014-07-14 MED ORDER — FENTANYL CITRATE 0.05 MG/ML IJ SOLN
INTRAMUSCULAR | Status: AC
  Filled 2014-07-14: qty 2

## 2014-07-14 MED ORDER — SODIUM CHLORIDE 0.9 % IV SOLN: 1.0000 mL/kg/h | INTRAVENOUS | Status: AC

## 2014-07-14 NOTE — H&P (View-Only) (Signed)
Patient Profile: 69 year old male with past medical history of coronary artery disease status post CABG and HLD, admitted for chest pain. Subsequent NST was abnormal revealing focal small area of reversible ischemia associated with a smaller chronic infarct within the apical portion of the anterior wall.    Subjective: No complaints. Currently CP free.   Objective: Vital signs in last 24 hours: Temp:  [97.7 F (36.5 C)-98.1 F (36.7 C)] 98.1 F (36.7 C) (01/27 0440) Pulse Rate:  [61-68] 68 (01/27 0440) Resp:  [18] 18 (01/27 0440) BP: (113-129)/(57-74) 113/57 mmHg (01/27 0440) SpO2:  [98 %-100 %] 98 % (01/27 0440) Last BM Date: 07/12/14  Intake/Output from previous day: 01/26 0701 - 01/27 0700 In: 823.6 [P.O.:240; I.V.:583.6] Out: -  Intake/Output this shift:    Medications Current Facility-Administered Medications  Medication Dose Route Frequency Provider Last Rate Last Dose  . 0.9 %  sodium chloride infusion  250 mL Intravenous PRN Erlene Quan, PA-C      . 0.9 %  sodium chloride infusion  1 mL/kg/hr Intravenous Continuous Erlene Quan, PA-C 66.7 mL/hr at 07/13/14 2107 1 mL/kg/hr at 07/13/14 2107  . acetaminophen (TYLENOL) tablet 650 mg  650 mg Oral Q6H PRN Rise Patience, MD       Or  . acetaminophen (TYLENOL) suppository 650 mg  650 mg Rectal Q6H PRN Rise Patience, MD      . aspirin EC tablet 325 mg  325 mg Oral Daily Rise Patience, MD   325 mg at 07/14/14 1056  . dicyclomine (BENTYL) tablet 20 mg  20 mg Oral Q6H PRN Delfina Redwood, MD      . Derrill Memo ON 07/15/2014] enoxaparin (LOVENOX) injection 40 mg  40 mg Subcutaneous Q24H Luke K Kilroy, PA-C      . ezetimibe (ZETIA) tablet 10 mg  10 mg Oral Daily Rise Patience, MD   10 mg at 07/14/14 1056  . fluticasone (FLONASE) 50 MCG/ACT nasal spray 2 spray  2 spray Each Nare Daily PRN Rise Patience, MD      . morphine 2 MG/ML injection 1 mg  1 mg Intravenous Q4H PRN Rise Patience, MD      .  nitroGLYCERIN (NITROSTAT) SL tablet 0.4 mg  0.4 mg Sublingual Q5 min PRN Rise Patience, MD      . ondansetron [facility]) tablet 4 mg  4 mg Oral Q6H PRN Rise Patience, MD       Or  . ondansetron (ZOFRAN) injection 4 mg  4 mg Intravenous Q6H PRN Rise Patience, MD      . pantoprazole (PROTONIX) EC tablet 40 mg  40 mg Oral Daily Delfina Redwood, MD   40 mg at 07/14/14 1100  . rosuvastatin (CRESTOR) tablet 20 mg  20 mg Oral Daily Rise Patience, MD   20 mg at 07/14/14 1056  . sodium chloride 0.9 % injection 3 mL  3 mL Intravenous Q12H Rise Patience, MD   3 mL at 07/13/14 2114  . sodium chloride 0.9 % injection 3 mL  3 mL Intravenous Q12H Rise Patience, MD   3 mL at 07/13/14 1051  . sodium chloride 0.9 % injection 3 mL  3 mL Intravenous Q12H Doreene Burke Kilroy, PA-C   0 mL at 07/13/14 2112  . sodium chloride 0.9 % injection 3 mL  3 mL Intravenous PRN Erlene Quan, PA-C        PE: General appearance: alert, cooperative and  no distress Neck: no carotid bruit and no JVD Lungs: clear to auscultation bilaterally Heart: regular rate and rhythm, S1, S2 normal, no murmur, click, rub or gallop Extremities: no LEE Pulses: 2+ and symmetric Skin: warm and dry Neurologic: Grossly normal  Lab Results:   Recent Labs  07/13/14 0148 07/13/14 0435 07/14/14 0540  WBC 13.8* 9.9 4.9  HGB 13.3 11.3* 12.0*  HCT 38.3* 33.5* 35.6*  PLT 191 149* 149*   BMET  Recent Labs  07/13/14 0148 07/13/14 0435 07/14/14 0540  NA 142 143 139  K 3.7 3.9 3.7  CL 107 107 104  CO2 29 30 27   GLUCOSE 100* 112* 94  BUN 12 13 10   CREATININE 1.10 1.05 0.96  CALCIUM 9.0 8.7 8.5   PT/INR  Recent Labs  07/14/14 0358  LABPROT 13.7  INR 1.04   Cardiac Panel (last 3 results)  Recent Labs  07/13/14 0435 07/13/14 0915 07/13/14 1739  TROPONINI <0.03 <0.03 <0.03    Studies/Results:  NST 07/13/14  FINDINGS: Perfusion: There is some baseline decreased perfusion within the apical  portion of the anterior wall. There is a large area of inducible ischemia on the stress related images.  Wall Motion: Mild hypokinesis is noted along the anterior wall near the apex. Wall motion is otherwise normal.  Left Ventricular Ejection Fraction: 68 %  End diastolic volume 82 ml  End systolic volume 26 ml  IMPRESSION: 1. Focal small area of reversible ischemia associated with a smaller chronic infarct within the apical portion of the anterior wall.  2. Mild hypokinesis within the area of inducible ischemia.  3. Left ventricular ejection fraction 68%  4. Intermediate-risk stress test findings*.  Assessment/Plan    Principal Problem:   Abnormal nuclear stress test Active Problems:   Chest pain with high risk for cardiac etiology - Atypical Pain, but abnormal Myoview.   Essential hypertension   Hyperlipidemia   Lymphoma, follicular   1. Abnormal NST of Intermediate Risk: Will plan for [facility] +/- PCI today.    2. CAD: continue ASA and statin. [place] to redefine coronary anatomy.    3. HLD: continue statin therapy with Crestor.    4. HTN: Well controlled. Continue metoprolol.     LOS: 1 day    Brittainy M. Ladoris Gene 07/14/2014 10:52 AM  I saw and examined the patient this morning along with Ms. Rosita Fire, PA-C. I agree with her findings, examination and recommendations. I reviewed all the clinical data.  The patient was admitted with chest pain symptoms concerning for cardiac etiology. He has a history of CAD and CABG. He underwent nuclear stress testing yesterday that showed an area of possible reversible ischemia. Based on Dr. Jacalyn Lefevre evaluation and her conditions, he is referred for cardiac catheterization today.  Further plans per Cath Note.  His blood pressure and heart rate are well controlled on current dose of beta blocker He is on statin plus Zetia for hyperlipidemia.  Leonie Man, M.D., M.S. Interventional Cardiologist   Pager #  [phone]

## 2014-07-14 NOTE — CV Procedure (Signed)
    PROCEDURE:  Left heart catheterization with selective coronary angiography, left ventriculogram. Bypass graft angiography. FFR of the left circumflex.  INDICATIONS:  Abnormal stress test  The risks, benefits, and details of the procedure were explained to the patient.  The patient verbalized understanding and wanted to proceed.  Informed written consent was obtained.  PROCEDURE TECHNIQUE:  After Xylocaine anesthesia a 25F sheath was placed in the right femoral artery with a single anterior needle wall stick.   Left coronary angiography was done using a Judkins L4 guide catheter.  Right coronary angiography was done using a Judkins R4 guide catheter.  Left ventriculography was done using a pigtail catheter.    CONTRAST:  Total of 125 cc.  COMPLICATIONS:  None.    HEMODYNAMICS:  Aortic pressure was 130/70; LV pressure was 130/5; LVEDP 18.  There was no gradient between the left ventricle and aorta.    ANGIOGRAPHIC DATA:   The left main coronary artery is patent..  The left anterior descending artery is occluded in the midportion. The LIMA to LAD is widely patent.  The SVG to diagonal is occluded.  The left circumflex artery is a medium size vessel. There is an ostial 50% stenosis. The first obtuse marginal is medium sized and patent. There is moderate proximal disease. The second obtuse marginal is small but patent. The third obtuse marginal is large and widely patent.  The free RIMA to obtuse marginal is patent.  The right coronary artery is occluded proximally.  The SVG to RCA is widely patent.  LEFT VENTRICULOGRAM:  Left ventricular angiogram was done in the 30 RAO projection and revealed normal left ventricular wall motion and systolic function with an estimated ejection fraction of 60%.  LVEDP was 18 mmHg.  FFR NARRATIVE: And EBU 3.5 guiding catheter was used to engage left main. The pressure wire was normalized at the tip of the guide. Maximal hyperemia was obtained with IV  adenosine. Resting FFR was 0.98. After adenosine, FFR was 0.92. This was not significant. Patient tolerated the procedure well.  IMPRESSIONS:  1. Normal left main coronary artery. 2. Occluded mid left anterior descending artery.  Patent LIMA to LAD. Occluded SVG to diagonal. 3. Moderate ostial disease of the left circumflex artery.  This was not significant by FFR. SVG to distal OM is widely patent.  There is no competitive flow from the native circumflex into the OM that is grafted. 4. Occluded right coronary artery.  Patent SVG to RCA 5. Normal left ventricular systolic function.  LVEDP 18 mmHg.  Ejection fraction 60%.  RECOMMENDATION:  Continue medical therapy. I suspect the occlusion of the SVG to diagonal is chronic since his troponins were negative. There is likely a small territory given his stress test results and overall normal left ventricular function. Consider long-acting nitrates if he starts having to use nitroglycerin. Would investigate GI etiologies of the symptoms that brought him into the hospital.

## 2014-07-14 NOTE — Progress Notes (Addendum)
Patient Profile: 69 year old male with past medical history of coronary artery disease status post CABG and HLD, admitted for chest pain. Subsequent NST was abnormal revealing focal small area of reversible ischemia associated with a smaller chronic infarct within the apical portion of the anterior wall.    Subjective: No complaints. Currently CP free.   Objective: Vital signs in last 24 hours: Temp:  [97.7 F (36.5 C)-98.1 F (36.7 C)] 98.1 F (36.7 C) (01/27 0440) Pulse Rate:  [61-68] 68 (01/27 0440) Resp:  [18] 18 (01/27 0440) BP: (113-129)/(57-74) 113/57 mmHg (01/27 0440) SpO2:  [98 %-100 %] 98 % (01/27 0440) Last BM Date: 07/12/14  Intake/Output from previous day: 01/26 0701 - 01/27 0700 In: 823.6 [P.O.:240; I.V.:583.6] Out: -  Intake/Output this shift:    Medications Current Facility-Administered Medications  Medication Dose Route Frequency Provider Last Rate Last Dose  . 0.9 %  sodium chloride infusion  250 mL Intravenous PRN Erlene Quan, PA-C      . 0.9 %  sodium chloride infusion  1 mL/kg/hr Intravenous Continuous Erlene Quan, PA-C 66.7 mL/hr at 07/13/14 2107 1 mL/kg/hr at 07/13/14 2107  . acetaminophen (TYLENOL) tablet 650 mg  650 mg Oral Q6H PRN Rise Patience, MD       Or  . acetaminophen (TYLENOL) suppository 650 mg  650 mg Rectal Q6H PRN Rise Patience, MD      . aspirin EC tablet 325 mg  325 mg Oral Daily Rise Patience, MD   325 mg at 07/14/14 1056  . dicyclomine (BENTYL) tablet 20 mg  20 mg Oral Q6H PRN Delfina Redwood, MD      . Derrill Memo ON 07/15/2014] enoxaparin (LOVENOX) injection 40 mg  40 mg Subcutaneous Q24H Luke K Kilroy, PA-C      . ezetimibe (ZETIA) tablet 10 mg  10 mg Oral Daily Rise Patience, MD   10 mg at 07/14/14 1056  . fluticasone (FLONASE) 50 MCG/ACT nasal spray 2 spray  2 spray Each Nare Daily PRN Rise Patience, MD      . morphine 2 MG/ML injection 1 mg  1 mg Intravenous Q4H PRN Rise Patience, MD      .  nitroGLYCERIN (NITROSTAT) SL tablet 0.4 mg  0.4 mg Sublingual Q5 min PRN Rise Patience, MD      . ondansetron [facility]) tablet 4 mg  4 mg Oral Q6H PRN Rise Patience, MD       Or  . ondansetron (ZOFRAN) injection 4 mg  4 mg Intravenous Q6H PRN Rise Patience, MD      . pantoprazole (PROTONIX) EC tablet 40 mg  40 mg Oral Daily Delfina Redwood, MD   40 mg at 07/14/14 1100  . rosuvastatin (CRESTOR) tablet 20 mg  20 mg Oral Daily Rise Patience, MD   20 mg at 07/14/14 1056  . sodium chloride 0.9 % injection 3 mL  3 mL Intravenous Q12H Rise Patience, MD   3 mL at 07/13/14 2114  . sodium chloride 0.9 % injection 3 mL  3 mL Intravenous Q12H Rise Patience, MD   3 mL at 07/13/14 1051  . sodium chloride 0.9 % injection 3 mL  3 mL Intravenous Q12H Doreene Burke Kilroy, PA-C   0 mL at 07/13/14 2112  . sodium chloride 0.9 % injection 3 mL  3 mL Intravenous PRN Erlene Quan, PA-C        PE: General appearance: alert, cooperative and  no distress Neck: no carotid bruit and no JVD Lungs: clear to auscultation bilaterally Heart: regular rate and rhythm, S1, S2 normal, no murmur, click, rub or gallop Extremities: no LEE Pulses: 2+ and symmetric Skin: warm and dry Neurologic: Grossly normal  Lab Results:   Recent Labs  07/13/14 0148 07/13/14 0435 07/14/14 0540  WBC 13.8* 9.9 4.9  HGB 13.3 11.3* 12.0*  HCT 38.3* 33.5* 35.6*  PLT 191 149* 149*   BMET  Recent Labs  07/13/14 0148 07/13/14 0435 07/14/14 0540  NA 142 143 139  K 3.7 3.9 3.7  CL 107 107 104  CO2 29 30 27   GLUCOSE 100* 112* 94  BUN 12 13 10   CREATININE 1.10 1.05 0.96  CALCIUM 9.0 8.7 8.5   PT/INR  Recent Labs  07/14/14 0358  LABPROT 13.7  INR 1.04   Cardiac Panel (last 3 results)  Recent Labs  07/13/14 0435 07/13/14 0915 07/13/14 1739  TROPONINI <0.03 <0.03 <0.03    Studies/Results:  NST 07/13/14  FINDINGS: Perfusion: There is some baseline decreased perfusion within the apical  portion of the anterior wall. There is a large area of inducible ischemia on the stress related images.  Wall Motion: Mild hypokinesis is noted along the anterior wall near the apex. Wall motion is otherwise normal.  Left Ventricular Ejection Fraction: 68 %  End diastolic volume 82 ml  End systolic volume 26 ml  IMPRESSION: 1. Focal small area of reversible ischemia associated with a smaller chronic infarct within the apical portion of the anterior wall.  2. Mild hypokinesis within the area of inducible ischemia.  3. Left ventricular ejection fraction 68%  4. Intermediate-risk stress test findings*.  Assessment/Plan    Principal Problem:   Abnormal nuclear stress test Active Problems:   Chest pain with high risk for cardiac etiology - Atypical Pain, but abnormal Myoview.   Essential hypertension   Hyperlipidemia   Lymphoma, follicular   1. Abnormal NST of Intermediate Risk: Will plan for [facility] +/- PCI today.    2. CAD: continue ASA and statin. [place] to redefine coronary anatomy.    3. HLD: continue statin therapy with Crestor.    4. HTN: Well controlled. Continue metoprolol.     LOS: 1 day    Brittainy M. Ladoris Gene 07/14/2014 10:52 AM  I saw and examined the patient this morning along with Ms. Rosita Fire, PA-C. I agree with her findings, examination and recommendations. I reviewed all the clinical data.  The patient was admitted with chest pain symptoms concerning for cardiac etiology. He has a history of CAD and CABG. He underwent nuclear stress testing yesterday that showed an area of possible reversible ischemia. Based on Dr. Jacalyn Lefevre evaluation and her conditions, he is referred for cardiac catheterization today.  Further plans per Cath Note.  His blood pressure and heart rate are well controlled on current dose of beta blocker He is on statin plus Zetia for hyperlipidemia.  Leonie Man, M.D., M.S. Interventional Cardiologist   Pager #  [phone]

## 2014-07-14 NOTE — Interval H&P Note (Signed)
Cath Lab Visit (complete for each Cath Lab visit)  Clinical Evaluation Leading to the Procedure:   ACS: No.  Non-ACS:    Anginal Classification: CCS IV  Anti-ischemic medical therapy: Minimal Therapy (1 class of medications)  Non-Invasive Test Results: Intermediate-risk stress test findings: cardiac mortality 1-3%/year  Prior CABG: Previous CABG      History and Physical Interval Note:  07/14/2014 4:46 PM  Willie Garcia  has presented today for surgery, with the diagnosis of positive stress test  The various methods of treatment have been discussed with the patient and family. After consideration of risks, benefits and other options for treatment, the patient has consented to  Procedure(s): LEFT HEART CATH AND CORS/GRAFTS ANGIOGRAPHY as a surgical intervention .  The patient's history has been reviewed, patient examined, no change in status, stable for surgery.  I have reviewed the patient's chart and labs.  Questions were answered to the patient's satisfaction.     Clarnce Homan S.

## 2014-07-14 NOTE — Progress Notes (Signed)
Pt transported off unit to cath lab for a procedure. P. Amo Arilynn Blakeney RN. 

## 2014-07-14 NOTE — Progress Notes (Signed)
Pt arrived from the cath lab at 1830. Pt right groin level 1, dsg clean and intact; no active bleeding or bruising noted; neuro check intact; pt educated and informed to keep right leg flat whiles in bed and to remain on bedrest till 2200. Pt voices understanding; vitals taken as ordered; IV intact and infusing; pt denies any pain; pt in bed with call light within reach and family at bedside. Reported off to incoming RN. Francis Gaines Jaylise Peek RN.

## 2014-07-15 DIAGNOSIS — R079 Chest pain, unspecified: Secondary | ICD-10-CM | POA: Insufficient documentation

## 2014-07-15 DIAGNOSIS — R931 Abnormal findings on diagnostic imaging of heart and coronary circulation: Principal | ICD-10-CM

## 2014-07-15 MED ORDER — ACETAMINOPHEN 325 MG PO TABS: 650.0000 mg | ORAL_TABLET | Freq: Three times a day (TID) | ORAL | Status: DC

## 2014-07-15 MED ORDER — ASPIRIN 81 MG PO TABS
81.0000 mg | ORAL_TABLET | Freq: Every day | ORAL | Status: DC
Start: 2014-07-15 — End: 2020-05-18

## 2014-07-15 MED ORDER — NITROGLYCERIN 0.4 MG SL SUBL: 0.4000 mg | SUBLINGUAL_TABLET | SUBLINGUAL | Status: DC | PRN

## 2014-07-15 MED ORDER — PANTOPRAZOLE SODIUM 40 MG PO TBEC: 40.0000 mg | DELAYED_RELEASE_TABLET | Freq: Every day | ORAL | Status: DC

## 2014-07-15 MED ORDER — TRAMADOL HCL 50 MG PO TABS: 50.0000 mg | ORAL_TABLET | Freq: Two times a day (BID) | ORAL | Status: DC | PRN

## 2014-07-15 MED ORDER — ACETAMINOPHEN 325 MG PO TABS: 650.0000 mg | ORAL_TABLET | Freq: Four times a day (QID) | ORAL | Status: DC | PRN

## 2014-07-15 NOTE — Discharge Summary (Signed)
Physician Discharge Summary  Willie Garcia ZDG:[id number] DOB: 05-Mar-1946 DOA: 07/13/2014  PCP: Tivis Ringer, MD  Admit date: 07/13/2014 Discharge date: 07/15/2014  Time spent: greater than 30 minutes  Recommendations for Outpatient Follow-up:  1.   Discharge Diagnoses:  Principal Problem:   Abnormal nuclear stress test Active Problems:   Essential hypertension   Lymphoma, follicular   Chest pain with high risk for cardiac etiology - Atypical Pain, but abnormal Myoview.   Hyperlipidemia   Discharge Condition: stable  Filed Weights   07/13/14 0347  Weight: 66.7 kg (147 lb 0.8 oz)    History of present illness:  69 y.o. male with history of CAD status post CABG, hypertension, hyperlipidemia, Follicular lymphoma in remission, dyspepsia has had recent EGD and colonoscopy for iron deficiency presents to the ER because of chest pain. Patient started having chest pain last night while watching TV which was starting from the epigastric area radiating to his retrosternal area. Was pressure-like no associated shortness of breath diaphoresis nausea vomiting or abdominal pain. EMS was called and patient was given aspirin and nitroglycerin sublingual following which patient's symptoms resolved. In the ER EKG and cardiac markers were unremarkable. Patient has been admitted for further management of his chest pain.   Hospital Course:  Admitted to telemetry. MI ruled out.  Myoview showed Focal small area of reversible ischemia associated with a smaller chronic infarct within the apical portion of the anterior wall.  2. Mild hypokinesis within the area of inducible ischemia.  3. Left ventricular ejection fraction 68%  4. Intermediate-risk stress test findings.  Cath showed:  Normal left main coronary artery.  Occluded mid left anterior descending artery. Patent LIMA to LAD. Occluded SVG to diagonal.  Moderate ostial disease of the left circumflex artery. This was not significant by FFR. SVG to  distal OM is widely patent. There is no competitive flow from the native circumflex into the OM that is grafted. Occluded right coronary artery. Patent SVG to RCA. Normal left ventricular systolic function. LVEDP 18 mmHg. Ejection fraction 60%.  Recommendation:  Continue medical therapy. suspect the occlusion of the SVG to diagonal is chronic since his troponins were negative. There is likely a small territory given his stress test results and overall normal left ventricular function. Consider long-acting nitrates if he starts having to use nitroglycerin.   Pain description consistent with GI etiology. Has h/o GERD, stopped PPI sometime within the past year. On multiple NSAIDs. Recommended patient stop NSAIDs, resume PPI.  Procedures:  Cardiac catheterizations  Consultations:  [facility]  Discharge Exam: Filed Vitals:   07/15/14 0949  BP: 98/75  Pulse: 75  Temp:   Resp:     General: comfortable Cardiovascular: RRR Respiratory: CTA  Discharge Instructions   Discharge Instructions    Activity as tolerated - No restrictions    Complete by:  As directed      Discharge instructions    Complete by:  As directed   Change aspirin to 81 mg daily          Current Discharge Medication List    START taking these medications   Details  acetaminophen (TYLENOL) 325 MG tablet Take 2 tablets (650 mg total) by mouth 3 (three) times daily.    nitroGLYCERIN (NITROSTAT) 0.4 MG SL tablet Place 1 tablet (0.4 mg total) under the tongue every 5 (five) minutes as needed for chest pain. Qty: 10 tablet, Refills: 0    pantoprazole (PROTONIX) 40 MG tablet Take 1 tablet (40 mg total)  by mouth daily. Qty: 30 tablet, Refills: 0    traMADol (ULTRAM) 50 MG tablet Take 1 tablet (50 mg total) by mouth every 12 (twelve) hours as needed. Qty: 15 tablet, Refills: 0      CONTINUE these medications which have CHANGED   Details  aspirin 81 MG tablet Take 1 tablet (81 mg total) by mouth daily.       CONTINUE these medications which have NOT CHANGED   Details  !! Cholecalciferol (VITAMIN D3) 2000 UNITS TABS Take 2,000 Units by mouth daily.    Cyanocobalamin (VITAMIN B-12 IJ) Inject 1 Applicatorful as directed every 30 (thirty) days.     dicyclomine (BENTYL) 20 MG tablet Take 20 mg by mouth every 6 (six) hours as needed for spasms.    ezetimibe (ZETIA) 10 MG tablet Take 10 mg by mouth daily.    fluticasone (FLONASE) 50 MCG/ACT nasal spray Place 2 sprays into both nostrils daily as needed for allergies or rhinitis.     folic acid (FOLVITE) 1 MG tablet Take 1 mg by mouth daily.      loratadine (CLARITIN) 10 MG tablet Take 10 mg by mouth daily as needed for allergies.    metoprolol (TOPROL-XL) 50 MG 24 hr tablet Take 50 mg by mouth daily.      rosuvastatin (CRESTOR) 20 MG tablet Take 20 mg by mouth daily.      testosterone cypionate (DEPOTESTOTERONE CYPIONATE) 200 MG/ML injection Inject 50 mg into the muscle once a week. AS DIRECTED    vitamin C (ASCORBIC ACID) 500 MG tablet Take 500 mg by mouth daily.    !! cholecalciferol (VITAMIN D) 400 UNITS TABS Take 400 Units by mouth daily.     Multiple Vitamin (MULTIVITAMIN) capsule Take 1 capsule by mouth daily.       !! - Potential duplicate medications found. Please discuss with provider.    STOP taking these medications     ibuprofen (ADVIL,MOTRIN) 200 MG tablet      naproxen sodium (ANAPROX) 220 MG tablet      meloxicam (MOBIC) 15 MG tablet        No Known Allergies Follow-up Information    Follow up with Cristopher Peru, MD.   Specialty:  Cardiology   Why:  The office will call you to make an appoinment., If you do not hear from them, please contact them., You should be seen within the next 1-2 months   Contact information:   [address] [place] [address] [phone]       Follow up with Silvano Rusk, MD In 2 months.   Specialty:  Gastroenterology   Why:  If symptoms worsen   Contact  information:   [address] [place] [address] [phone]        The results of significant diagnostics from this hospitalization (including imaging, microbiology, ancillary and laboratory) are listed below for reference.    Significant Diagnostic Studies: Nm Myocar Multi W/spect W/wall Motion / Ef  07/13/2014   CLINICAL DATA:  Chest pain, anemia, hypertension, and hyperlipidemia. Personal history of non-Hodgkin's lymphoma.  EXAM: MYOCARDIAL IMAGING WITH SPECT (REST AND PHARMACOLOGIC-STRESS)  GATED LEFT VENTRICULAR WALL MOTION STUDY  LEFT VENTRICULAR EJECTION FRACTION  TECHNIQUE: Standard myocardial SPECT imaging was performed after resting intravenous injection of 10 mCi Tc-21m sestamibi. Subsequently, intravenous infusion of Lexiscan was performed under the supervision of the Cardiology staff. At peak effect of the drug, 30 mCi Tc-63m sestamibi was injected intravenously and standard myocardial SPECT  imaging was performed. Quantitative gated imaging was also performed to evaluate left ventricular wall motion, and estimate left ventricular ejection fraction.  COMPARISON:  One-view chest x-ray 07/13/2014  FINDINGS: Perfusion: There is some baseline decreased perfusion within the apical portion of the anterior wall. There is a large area of inducible ischemia on the stress related images.  Wall Motion: Mild hypokinesis is noted along the anterior wall near the apex. Wall motion is otherwise normal.  Left Ventricular Ejection Fraction: 68 %  End diastolic volume 82 ml  End systolic volume 26 ml  IMPRESSION: 1. Focal small area of reversible ischemia associated with a smaller chronic infarct within the apical portion of the anterior wall.  2. Mild hypokinesis within the area of inducible ischemia.  3. Left ventricular ejection fraction 68%  4. Intermediate-risk stress test findings*.  *2012 Appropriate Use Criteria for Coronary Revascularization Focused Update: J Am Coll Cardiol.  3428;76(8):115-726. http://content.[email or web address].aspx?articleid=1201161   Electronically Signed   By: Lawrence Santiago M.D.   On: 07/13/2014 15:55   Dg Chest Port 1 View  07/13/2014   CLINICAL DATA:  Acute onset of lower chest and upper abdominal pain. Initial encounter.  EXAM: PORTABLE CHEST - 1 VIEW  COMPARISON:  CT of the chest performed 05/15/2010  FINDINGS: The lungs are well-aerated and clear. There is no evidence of focal opacification, pleural effusion or pneumothorax.  The cardiomediastinal silhouette is normal in size. The patient is status post median sternotomy, with evidence of prior CABG. No acute osseous abnormalities are seen.  IMPRESSION: No acute cardiopulmonary process seen.   Electronically Signed   By: Garald Balding M.D.   On: 07/13/2014 02:21  Echo Left ventricle: The cavity size was normal. Wall thickness was normal. Systolic function was normal. The estimated ejection fraction was in the range of 55% to 60%. Images were inadequate for LV wall motion assessment. - Aortic valve: Trileaflet; normal thickness, mildly calcified leaflets. - Mitral valve: There was mild regurgitation. - Tricuspid valve: There was trivial regurgitation. - Impressions: Recommend repeating study as this study was incomplete with only the parasternal views obtained.  Impressions:  - Recommend repeating study as this study was incomplete with only the parasternal views obtained.  EKG Sinus rhythm Atrial premature complexes Anteroseptal infarct, age indeterminate Prolonged QT interval Ectopy not seen previously  Microbiology: No results found for this or any previous visit (from the past 240 hour(s)).   Labs: Basic Metabolic Panel:  Recent Labs Lab 07/13/14 0148 07/13/14 0435 07/14/14 0540  NA 142 143 139  K 3.7 3.9 3.7  CL 107 107 104  CO2 29 30 27   GLUCOSE 100* 112* 94  BUN 12 13 10   CREATININE 1.10 1.05 0.96  CALCIUM 9.0 8.7 8.5   Liver Function  Tests:  Recent Labs Lab 07/13/14 0435  AST 21  ALT 18  ALKPHOS 60  BILITOT 0.7  PROT 5.5*  ALBUMIN 3.4*    Recent Labs Lab 07/13/14 0915  LIPASE 93*   No results for input(s): AMMONIA in the last 168 hours. CBC:  Recent Labs Lab 07/13/14 0148 07/13/14 0435 07/14/14 0540  WBC 13.8* 9.9 4.9  NEUTROABS  --  8.5*  --   HGB 13.3 11.3* 12.0*  HCT 38.3* 33.5* 35.6*  MCV 91.2 93.3 91.8  PLT 191 149* 149*   Cardiac Enzymes:  Recent Labs Lab 07/13/14 0435 07/13/14 0915 07/13/14 1739  TROPONINI <0.03 <0.03 <0.03   BNP: BNP (last 3 results) No results for input(s): PROBNP in the last  8760 hours. CBG: No results for input(s): GLUCAP in the last 168 hours.     SignedDelfina Redwood  [facility] 07/15/2014, 11:21 AM

## 2014-07-15 NOTE — Progress Notes (Signed)
LATE ENTRY   [facility] PROGRESS NOTE  Willie Garcia JJH:[id number] DOB: [birth year] DOA: 07/13/2014 PCP: Tivis Ringer, MD  Assessment/Plan:  Principal Problem:   Abnormal nuclear stress test: going for cath now Active Problems:   Essential hypertension   Lymphoma, follicular   Chest pain: MI ruled out   Hyperlipidemia CAD  HPI/Subjective: No cp, no n/v/d. C/o headache better with tylenol  Objective: Filed Vitals:   07/15/14 0532  BP: 111/22  Pulse: 61  Temp: 97.4 F (36.3 C)  Resp: 18    Intake/Output Summary (Last 24 hours) at 07/15/14 0814 Last data filed at 07/14/14 2222  Gross per 24 hour  Intake      0 ml  Output    450 ml  Net   -450 ml   Filed Weights   07/13/14 0347  Weight: 66.7 kg (147 lb 0.8 oz)    Exam:   General:  Comfortable. Being wheeled away for cath  Cardiovascular: RRR  Respiratory: CTA  Abdomen: S, nt, nd  Ext: no CCE  Basic Metabolic Panel:  Recent Labs Lab 07/08/14 0917 07/13/14 0148 07/13/14 0435 07/14/14 0540  NA 141 142 143 139  K 3.9 3.7 3.9 3.7  CL  --  107 107 104  CO2 30* 29 30 27   GLUCOSE 91 100* 112* 94  BUN 16.9 12 13 10   CREATININE 0.9 1.10 1.05 0.96  CALCIUM 8.9 9.0 8.7 8.5   Liver Function Tests:  Recent Labs Lab 07/08/14 0917 07/13/14 0435  AST 16 21  ALT 13 18  ALKPHOS 80 60  BILITOT 0.65 0.7  PROT 6.5 5.5*  ALBUMIN 4.0 3.4*    Recent Labs Lab 07/13/14 0915  LIPASE 93*   No results for input(s): AMMONIA in the last 168 hours. CBC:  Recent Labs Lab 07/08/14 0916 07/13/14 0148 07/13/14 0435 07/14/14 0540  WBC 5.9 13.8* 9.9 4.9  NEUTROABS 3.9  --  8.5*  --   HGB 12.9* 13.3 11.3* 12.0*  HCT 38.7 38.3* 33.5* 35.6*  MCV 93.2 91.2 93.3 91.8  PLT 215 191 149* 149*   Cardiac Enzymes:  Recent Labs Lab 07/13/14 0435 07/13/14 0915 07/13/14 1739  TROPONINI <0.03 <0.03 <0.03   BNP (last 3 results) No results for input(s): PROBNP in the last 8760 hours. CBG: No  results for input(s): GLUCAP in the last 168 hours.  No results found for this or any previous visit (from the past 240 hour(s)).   Studies: Nm Myocar Multi W/spect W/wall Motion / Ef  07/13/2014   CLINICAL DATA:  Chest pain, anemia, hypertension, and hyperlipidemia. Personal history of non-Hodgkin's lymphoma.  EXAM: MYOCARDIAL IMAGING WITH SPECT (REST AND PHARMACOLOGIC-STRESS)  GATED LEFT VENTRICULAR WALL MOTION STUDY  LEFT VENTRICULAR EJECTION FRACTION  TECHNIQUE: Standard myocardial SPECT imaging was performed after resting intravenous injection of 10 mCi Tc-56m sestamibi. Subsequently, intravenous infusion of Lexiscan was performed under the supervision of the Cardiology staff. At peak effect of the drug, 30 mCi Tc-19m sestamibi was injected intravenously and standard myocardial SPECT imaging was performed. Quantitative gated imaging was also performed to evaluate left ventricular wall motion, and estimate left ventricular ejection fraction.  COMPARISON:  One-view chest x-ray 07/13/2014  FINDINGS: Perfusion: There is some baseline decreased perfusion within the apical portion of the anterior wall. There is a large area of inducible ischemia on the stress related images.  Wall Motion: Mild hypokinesis is noted along the anterior wall near the apex. Wall motion is otherwise normal.  Left Ventricular Ejection  Fraction: 68 %  End diastolic volume 82 ml  End systolic volume 26 ml  IMPRESSION: 1. Focal small area of reversible ischemia associated with a smaller chronic infarct within the apical portion of the anterior wall.  2. Mild hypokinesis within the area of inducible ischemia.  3. Left ventricular ejection fraction 68%  4. Intermediate-risk stress test findings*.  *2012 Appropriate Use Criteria for Coronary Revascularization Focused Update: J Am Coll Cardiol. 6578;46(9):629-528. http://content.airportbarriers.com.aspx?articleid=1201161   Electronically Signed   By: Lawrence Santiago M.D.   On: 07/13/2014  15:55    Scheduled Meds: . aspirin EC  325 mg Oral Daily  . ezetimibe  10 mg Oral Daily  . pantoprazole  40 mg Oral Daily  . rosuvastatin  20 mg Oral Daily  . sodium chloride  3 mL Intravenous Q12H  . sodium chloride  3 mL Intravenous Q12H   Continuous Infusions:   Time spent: 35 minutes  [facility]  [email or web address], password [facility] 07/15/2014, 8:14 AM  LOS: 2 days

## 2014-07-15 NOTE — Progress Notes (Signed)
Patient Name: Willie Garcia Date of Encounter: 07/15/2014     Principal Problem:   Abnormal nuclear stress test Active Problems:   Essential hypertension   Lymphoma, follicular   Chest pain with high risk for cardiac etiology - Atypical Pain, but abnormal Myoview.   Hyperlipidemia    SUBJECTIVE  Feeling well. No further chest pain. Would like to go home today.  CURRENT MEDS . aspirin EC  325 mg Oral Daily  . ezetimibe  10 mg Oral Daily  . pantoprazole  40 mg Oral Daily  . rosuvastatin  20 mg Oral Daily  . sodium chloride  3 mL Intravenous Q12H  . sodium chloride  3 mL Intravenous Q12H    OBJECTIVE  Filed Vitals:   07/14/14 2023 07/14/14 2120 07/14/14 2219 07/15/14 0532  BP: 115/77 120/73 122/67 111/22  Pulse: 59 58 57 61  Temp: 97.7 F (36.5 C) 97.7 F (36.5 C) 97.8 F (36.6 C) 97.4 F (36.3 C)  TempSrc: Oral Oral Tympanic Oral  Resp: 18 16 16 18   Height:      Weight:      SpO2: 100% 100% 99% 97%    Intake/Output Summary (Last 24 hours) at 07/15/14 0935 Last data filed at 07/15/14 0845  Gross per 24 hour  Intake    240 ml  Output    450 ml  Net   -210 ml   Filed Weights   07/13/14 0347  Weight: 147 lb 0.8 oz (66.7 kg)    PHYSICAL EXAM  General: Pleasant, NAD. Neuro: Alert and oriented X 3. Moves all extremities spontaneously. Psych: Normal affect. HEENT:  Normal  Neck: Supple without bruits or JVD. Lungs:  Resp regular and unlabored, CTA. Heart: RRR no s3, s4, or murmurs. Abdomen: Soft, non-tender, non-distended, BS + x 4.  Extremities: No clubbing, cyanosis or edema. DP/PT/Radials 2+ and equal bilaterally.  Accessory Clinical Findings  CBC  Recent Labs  07/13/14 0435 07/14/14 0540  WBC 9.9 4.9  NEUTROABS 8.5*  --   HGB 11.3* 12.0*  HCT 33.5* 35.6*  MCV 93.3 91.8  PLT 149* 237*   Basic Metabolic Panel  Recent Labs  07/13/14 0435 07/14/14 0540  NA 143 139  K 3.9 3.7  CL 107 104  CO2 30 27  GLUCOSE 112* 94  BUN 13 10    CREATININE 1.05 0.96  CALCIUM 8.7 8.5   Liver Function Tests  Recent Labs  07/13/14 0435  AST 21  ALT 18  ALKPHOS 60  BILITOT 0.7  PROT 5.5*  ALBUMIN 3.4*    Recent Labs  07/13/14 0915  LIPASE 93*   Cardiac Enzymes  Recent Labs  07/13/14 0435 07/13/14 0915 07/13/14 1739  TROPONINI <0.03 <0.03 <0.03    Radiology/Studies  Nm Myocar Multi W/spect W/wall Motion / Ef  07/13/2014   CLINICAL DATA:  Chest pain, anemia, hypertension, and hyperlipidemia. Personal history of non-Hodgkin's lymphoma.  EXAM: MYOCARDIAL IMAGING WITH SPECT (REST AND PHARMACOLOGIC-STRESS)  GATED LEFT VENTRICULAR WALL MOTION STUDY  LEFT VENTRICULAR EJECTION FRACTION  TECHNIQUE: Standard myocardial SPECT imaging was performed after resting intravenous injection of 10 mCi Tc-78m sestamibi. Subsequently, intravenous infusion of Lexiscan was performed under the supervision of the Cardiology staff. At peak effect of the drug, 30 mCi Tc-71m sestamibi was injected intravenously and standard myocardial SPECT imaging was performed. Quantitative gated imaging was also performed to evaluate left ventricular wall motion, and estimate left ventricular ejection fraction.  COMPARISON:  One-view chest x-ray 07/13/2014  FINDINGS: Perfusion: There  is some baseline decreased perfusion within the apical portion of the anterior wall. There is a large area of inducible ischemia on the stress related images.  Wall Motion: Mild hypokinesis is noted along the anterior wall near the apex. Wall motion is otherwise normal.  Left Ventricular Ejection Fraction: 68 %  End diastolic volume 82 ml  End systolic volume 26 ml  IMPRESSION: 1. Focal small area of reversible ischemia associated with a smaller chronic infarct within the apical portion of the anterior wall.  2. Mild hypokinesis within the area of inducible ischemia.  3. Left ventricular ejection fraction 68%  4. Intermediate-risk stress test findings*.  *2012 Appropriate Use Criteria for  Coronary Revascularization Focused Update: J Am Coll Cardiol. 1610;96(0):454-098. http://content.airportbarriers.com.aspx?articleid=1201161   Electronically Signed   By: Lawrence Santiago M.D.   On: 07/13/2014 15:55   Dg Chest Port 1 View  07/13/2014   CLINICAL DATA:  Acute onset of lower chest and upper abdominal pain. Initial encounter.  EXAM: PORTABLE CHEST - 1 VIEW  COMPARISON:  CT of the chest performed 05/15/2010  FINDINGS: The lungs are well-aerated and clear. There is no evidence of focal opacification, pleural effusion or pneumothorax.  The cardiomediastinal silhouette is normal in size. The patient is status post median sternotomy, with evidence of prior CABG. No acute osseous abnormalities are seen.  IMPRESSION: No acute cardiopulmonary process seen.   Electronically Signed   By: Garald Balding M.D.   On: 07/13/2014 02:21    ASSESSMENT AND PLAN 69 year old male with past medical history of coronary artery disease status post CABG and HLD, admitted for chest pain. Subsequent NST was abnormal revealing focal small area of reversible ischemia associated with a smaller chronic infarct within the apical portion of the anterior wall.   CAD/Abnormal NST of Intermediate Risk: s/p LHC 07/14/14 which revealed 1. Normal left main coronary artery. 2. Occluded mid left anterior descending artery. Patent LIMA to LAD. Occluded SVG to diagonal. 3. Moderate ostial disease of the left circumflex artery. This was not significant by FFR. SVG to distal OM is widely patent. There is no competitive flow from the native circumflex into the OM that is grafted. 4. Occluded right coronary artery. Patent SVG to RCA 5. Normal left ventricular systolic function. LVEDP 18 mmHg. Ejection fraction 60%. -- RECOMMENDATION: Continue medical therapy. I suspect the occlusion of the SVG to diagonal is chronic since his troponins were negative. There is likely a small territory given his stress test results and overall  normal left ventricular function. -- The patient has been up walking around with no exertional chest pain. No need for long acting nitrates for now.  -- Continue ASA and statin.   HLD: continue statin therapy with Crestor.   Dispo- cardiology with sign off. I have sent a staff message to the office to call him to arrange a f/u appt with Dr. Lovena Le, his primary cardiologist, in 1-2 months.   Judy Pimple PA-C  Pager [phone]  ATTENDING ATTESTATION:  I have seen, examined and evaluated the patient this AM along with Angelena Form R PA-C.  After reviewing all the available data and chart,  I agree with her findings, examination as well as impression recommendations.  Admitted with symptoms that sound of like epigastric discomfort probably more from a GI standpoint, but somewhat concerning for anginal symptoms. Had a stress test that suggested possible reversibility / ischemia. 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  This could explain the small area of ischemia if there is not collateral flow sufficient to fully perfuse that region. The sensitivity of Lexiscan Myoview is likely to pick up on that small area.  Unlikely that this is the etiology of his symptoms, and most likely is abdominal/GI in nature.  He is not on any significant antianginal medications. Meds only cardiac medications are aspirin and statin. At this point if he is not having active symptoms at all so we probably do not need to use nitrates. Would defer potential antianginal treatment for his primary cardiologist at home.  There is no cardiac reason to keep him in the hospital. I'm fine internal medicine like to discharge him today.  Leonie Man, M.D., M.S. Interventional Cardiologist   Pager # [phone]

## 2014-07-15 NOTE — Progress Notes (Signed)
07/15/2014 11:55 AM Discharge AVS meds taken today and those due this evening reviewed. Instructed pt. To assess right groin cath site for bleeding/hematoma.  Follow-up appointments and when to call md reviewed.  D/C IV and TELE.  Questions and concerns addressed.   D/C home per orders. [place]

## 2014-07-20 ENCOUNTER — Telehealth: Payer: Self-pay | Admitting: *Deleted

## 2014-07-21 ENCOUNTER — Encounter: Payer: Medicare Other | Admitting: Internal Medicine

## 2014-08-06 NOTE — Telephone Encounter (Signed)
none

## 2014-08-19 ENCOUNTER — Encounter: Payer: Medicare Other | Admitting: Internal Medicine

## 2014-08-24 DIAGNOSIS — Z1389 Encounter for screening for other disorder: Secondary | ICD-10-CM | POA: Diagnosis not present

## 2014-08-24 DIAGNOSIS — Z6821 Body mass index (BMI) 21.0-21.9, adult: Secondary | ICD-10-CM | POA: Diagnosis not present

## 2014-08-24 DIAGNOSIS — I2510 Atherosclerotic heart disease of native coronary artery without angina pectoris: Secondary | ICD-10-CM | POA: Diagnosis not present

## 2014-08-24 DIAGNOSIS — K219 Gastro-esophageal reflux disease without esophagitis: Secondary | ICD-10-CM | POA: Diagnosis not present

## 2014-08-24 DIAGNOSIS — F4321 Adjustment disorder with depressed mood: Secondary | ICD-10-CM | POA: Diagnosis not present

## 2014-08-24 DIAGNOSIS — B349 Viral infection, unspecified: Secondary | ICD-10-CM | POA: Diagnosis not present

## 2014-08-24 DIAGNOSIS — I10 Essential (primary) hypertension: Secondary | ICD-10-CM | POA: Diagnosis not present

## 2014-09-06 ENCOUNTER — Ambulatory Visit (INDEPENDENT_AMBULATORY_CARE_PROVIDER_SITE_OTHER): Payer: Medicare Other | Admitting: Internal Medicine

## 2014-09-06 ENCOUNTER — Encounter: Payer: Self-pay | Admitting: Internal Medicine

## 2014-09-06 VITALS — BP 108/60 | HR 60 | Ht 70.0 in | Wt 143.5 lb

## 2014-09-06 DIAGNOSIS — K589 Irritable bowel syndrome without diarrhea: Secondary | ICD-10-CM | POA: Diagnosis not present

## 2014-09-06 DIAGNOSIS — R079 Chest pain, unspecified: Secondary | ICD-10-CM | POA: Diagnosis not present

## 2014-09-06 MED ORDER — DICYCLOMINE HCL 20 MG PO TABS: 20.0000 mg | ORAL_TABLET | Freq: Four times a day (QID) | ORAL | Status: DC | PRN

## 2014-09-06 MED ORDER — PANTOPRAZOLE SODIUM 20 MG PO TBEC: 20.0000 mg | DELAYED_RELEASE_TABLET | Freq: Every day | ORAL | Status: DC

## 2014-09-06 NOTE — Patient Instructions (Signed)
We have sent the following medications to your pharmacy for you to pick up at your convenience: Protonix, Dicyclomine    I appreciate the opportunity to care for you. Silvano Rusk, M.D., [facility]

## 2014-09-06 NOTE — Progress Notes (Signed)
   Subjective:    Patient ID: Willie Garcia, male    DOB: 01/07/1946, 69 y.o.   MRN: [id number] Cc: Follow-up after chest pain admission HPI Patient is here for follow-up of GERD symptoms and IBS. He is doing fairly well on PPI therapy and occasionally has intermittent abdominal distress with IBS-like problems. He had a colonoscopy and an EGD in January 2015. He had anemia, some dysphagia problems at that time I did an empiric 60 French Maloney dilation and he has been fine since. His wife remains concerned that he doesn't eat well and the doesn't gain weight though his weight is stable. He had a hospital admission for chest pain with negative cardiac evaluation in January 2016. He had epigastric pain radiating into the chest as well as abdominal distention. He was started on pantoprazole. At that time he was apparently using a fair amount of nonsteroidals. Medications, allergies, past medical history, past surgical history, family history and social history are reviewed and updated in the EMR.  Review of Systems As above    Objective:   Physical Exam BP 108/60 mmHg  Pulse 60  Ht 5\' 10"  (1.778 m)  Wt 143 lb 8 oz (65.091 kg)  BMI 20.59 kg/m2 Thin elderly white man in no acute distress    Assessment & Plan:   1. Chest pain, unspecified chest pain type   2. IBS (irritable bowel syndrome)    Plan is to continue PPI and intermittent dicyclomine. I don't think further GI investigation is necessary. I will see him in a year or sooner as needed. I have reduced his PPI dose from 40 mg daily of pantoprazole 20 mg daily to see if he can get by with a lower dose.  I appreciate the opportunity to care for this patient. CC: Tivis Ringer, MD

## 2014-09-30 ENCOUNTER — Telehealth: Payer: Self-pay | Admitting: Internal Medicine

## 2014-09-30 NOTE — Telephone Encounter (Signed)
Left a message for patient to return my call. 

## 2014-09-30 NOTE — Telephone Encounter (Signed)
Patient's wife states he has been prescribed reglan in the past by Dr. Curlene Labrum to help with peristalsis in his esophagus. He states he felt like this medication really helped with fullness and discomfort. Can we send in Reglan?

## 2014-10-01 ENCOUNTER — Other Ambulatory Visit: Payer: Self-pay

## 2014-10-01 MED ORDER — METOCLOPRAMIDE HCL 5 MG PO TABS: ORAL_TABLET | ORAL | Status: DC

## 2014-10-01 NOTE — Telephone Encounter (Signed)
It helps the stomach empty not the esophagus I will Rx some but needs a f/u in 2 months to review things because of possible side effects The dicyclomine he has works in a somewhat oppoisite fashion - relaxes things so would not use them together   metaclopramide 5 mg qac prn # 90 1 RF REV 2 months

## 2014-10-01 NOTE — Telephone Encounter (Signed)
Left a message informing her that her husband does need a f/u visit in 2 months to go over symptoms and side effects. Also he should not take the dicyclomine and reglan together because it can work against each other. Prescription sent to patient's pharmacy.

## 2014-10-05 ENCOUNTER — Ambulatory Visit (INDEPENDENT_AMBULATORY_CARE_PROVIDER_SITE_OTHER): Payer: Medicare Other | Admitting: Internal Medicine

## 2014-10-05 ENCOUNTER — Encounter: Payer: Self-pay | Admitting: Internal Medicine

## 2014-10-05 VITALS — BP 96/60 | HR 58 | Ht 70.0 in | Wt 144.2 lb

## 2014-10-05 DIAGNOSIS — I10 Essential (primary) hypertension: Secondary | ICD-10-CM | POA: Diagnosis not present

## 2014-10-05 DIAGNOSIS — I2510 Atherosclerotic heart disease of native coronary artery without angina pectoris: Secondary | ICD-10-CM | POA: Diagnosis not present

## 2014-10-05 DIAGNOSIS — R079 Chest pain, unspecified: Secondary | ICD-10-CM | POA: Diagnosis not present

## 2014-10-05 MED ORDER — METOPROLOL SUCCINATE ER 50 MG PO TB24: 50.0000 mg | ORAL_TABLET | Freq: Every day | ORAL | Status: DC

## 2014-10-05 MED ORDER — METOPROLOL SUCCINATE ER 25 MG PO TB24: 25.0000 mg | ORAL_TABLET | Freq: Every day | ORAL | Status: DC

## 2014-10-05 NOTE — Assessment & Plan Note (Signed)
His blood pressure is on the low side. I've asked the patient to reduce his dose of beta blocker.

## 2014-10-05 NOTE — Assessment & Plan Note (Signed)
He will continue his current low-fat diet, and Crestor.

## 2014-10-05 NOTE — Patient Instructions (Addendum)
Your physician has recommended you make the following change in your medication:  1.) REDUCE YOUR DOSE OF METOPROLOL TO 25 MG DAILY (TAKE 1/2 TAB) Your physician wants you to follow-up in: [place].  You will receive a reminder letter in the mail two months in advance. If you don't receive a letter, please call our office to schedule the follow-up appointment.

## 2014-10-05 NOTE — Assessment & Plan Note (Signed)
He has no anginal symptoms. Because he has some weakness, I've asked the patient to reduce his dose of Toprol to half tablet a day, 25 mg daily.

## 2014-10-05 NOTE — Progress Notes (Signed)
HPI [name] returns for followup. He is a pleasant 69 yo man with a h/o CAD, dyslipidemia, s/p CABG and follicular lympyhoma. In the interim he has been in the hospital with chest pain, had an abnormal stress test and catheterization. He denies chest pain or sob. No syncope. He remains active. He has had some weakness.  No Known Allergies   Current Outpatient Prescriptions  Medication Sig Dispense Refill  . acetaminophen (TYLENOL) 325 MG tablet Take 2 tablets (650 mg total) by mouth 3 (three) times daily.    Marland Kitchen aspirin 81 MG tablet Take 1 tablet (81 mg total) by mouth daily.    Marland Kitchen CALCIUM-VITAMIN D PO Take 1 tablet by mouth 2 (two) times daily. (600-200)    . Cholecalciferol (VITAMIN D3) 2000 UNITS TABS Take 2,000 Units by mouth daily.    . Cyanocobalamin (VITAMIN B-12 IJ) Inject 1 mL as directed every 30 (thirty) days.     Marland Kitchen dicyclomine (BENTYL) 20 MG tablet Take 1 tablet (20 mg total) by mouth every 6 (six) hours as needed for spasms. 120 tablet 5  . ezetimibe (ZETIA) 10 MG tablet Take 10 mg by mouth daily.    . fluticasone (FLONASE) 50 MCG/ACT nasal spray Place 2 sprays into both nostrils daily as needed for allergies or rhinitis.     . folic acid (FOLVITE) 1 MG tablet Take 1 mg by mouth daily.      Marland Kitchen loratadine (CLARITIN) 10 MG tablet Take 10 mg by mouth daily as needed for allergies.    Marland Kitchen metoCLOPramide (REGLAN) 5 MG tablet Take one tablet by mouth three times a day before meals as needed (Patient taking differently: Take one tablet by mouth three times a day before meals as needed stomach issues) 90 tablet 1  . metoprolol succinate (TOPROL-XL) 25 MG 24 hr tablet Take 1 tablet (25 mg total) by mouth daily. 90 tablet 2  . Multiple Vitamin (MULTIVITAMIN) capsule Take 1 capsule by mouth daily.      . nitroGLYCERIN (NITROSTAT) 0.4 MG SL tablet Place 1 tablet (0.4 mg total) under the tongue every 5 (five) minutes as needed for chest pain. (Patient taking differently: Place 0.4 mg under the  tongue every 5 (five) minutes as needed for chest pain (MAX 3 TABLETS). ) 10 tablet 0  . pantoprazole (PROTONIX) 20 MG tablet Take 1 tablet (20 mg total) by mouth daily before breakfast. 30 tablet 11  . rosuvastatin (CRESTOR) 20 MG tablet Take 20 mg by mouth daily.      Marland Kitchen testosterone cypionate (DEPOTESTOTERONE CYPIONATE) 200 MG/ML injection Inject 50 mg into the muscle once a week. AS DIRECTED    . traMADol (ULTRAM) 50 MG tablet Take 1 tablet (50 mg total) by mouth every 12 (twelve) hours as needed. (Patient taking differently: Take 50 mg by mouth every 12 (twelve) hours as needed (pain). ) 15 tablet 0  . vitamin C (ASCORBIC ACID) 500 MG tablet Take 500 mg by mouth daily.     No current facility-administered medications for this visit.     Past Medical History  Diagnosis Date  . Allergic rhinitis   . Non Hodgkin's lymphoma   . CAD (coronary artery disease)   . Depression   . GERD (gastroesophageal reflux disease)   . Hyperlipidemia   . HTN (hypertension)   . Osteopenia   . Anemia   . Anxiety   . Scoliosis   . BPH (benign prostatic hyperplasia)   . Personal history of colonic adenomas 04/04/2010  .  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 Basal cell carcinoma of skin 2015    ROS:   All systems reviewed and negative except as noted in the HPI.   Past Surgical History  Procedure Laterality Date  . Coronary artery bypass graft    . Rhinoplasty    . Hernia repair    . Colonoscopy  06/2013    negative  . Upper gastrointestinal endoscopy    . Cervical spine surgery      C4-C6  . Cardiac catheterization  07/14/2014    Procedure: LEFT HEART CATH AND CORS/GRAFTS ANGIOGRAPHY;  Surgeon: Jettie Booze, MD;  Location: [facility] CATH LAB;  Service: Cardiovascular;;  . Cardiac catheterization  07/14/2014    Procedure: INTRAVASCULAR PRESSURE WIRE/FFR STUDY;  Surgeon: Jettie Booze, MD;  Location: [facility] CATH LAB;  Service: Cardiovascular;;  circ     Family History  Problem  Relation Age of Onset  . Colon cancer Neg Hx   . Stomach cancer Neg Hx   . Esophageal cancer Neg Hx   . Rectal cancer Neg Hx   . Heart disease Mother   . Heart attack Mother 19  . Heart disease Father   . Heart attack Father 80  . Heart attack Brother 62  . Diabetes Brother   . Aneurysm Paternal Uncle   . Aneurysm Maternal Grandmother   . Heart attack Maternal Grandmother   . Aneurysm Maternal Grandfather   . Heart attack Maternal Grandfather      History   Social History  . Marital Status: Married    Spouse Name: N/A  . Number of Children: N/A  . Years of Education: N/A   Occupational History  . Retired    Social History Main Topics  . Smoking status: Never Smoker   . Smokeless tobacco: Never Used  . Alcohol Use: No  . Drug Use: No  . Sexual Activity: Not on file   Other Topics Concern  . Not on file   Social History Narrative   Married, wife is a Marine scientist, no children   Retired Microbiologist, flies as a hobby   2 caffeinated beverages daily     BP 96/60 mmHg  Pulse 58  Ht 5\' 10"  (1.778 m)  Wt 144 lb 3.2 oz (65.409 kg)  BMI 20.69 kg/m2  Physical Exam:  Well appearing 69 yo man,NAD HEENT: Unremarkable Neck:  6 cm JVD, no thyromegally Lungs:  Clear with no wheezes HEART:  Regular rate rhythm, no murmurs, no rubs, no clicks Abd:  soft, positive bowel sounds, no organomegally, no rebound, no guarding Ext:  2 plus pulses, no edema, no cyanosis, no clubbing Skin:  No rashes no nodules Neuro:  CN II through XII intact, motor grossly intact  EKG - nsr   Assess/Plan:

## 2014-10-28 DIAGNOSIS — M4120 Other idiopathic scoliosis, site unspecified: Secondary | ICD-10-CM | POA: Diagnosis not present

## 2014-10-28 DIAGNOSIS — M47816 Spondylosis without myelopathy or radiculopathy, lumbar region: Secondary | ICD-10-CM | POA: Diagnosis not present

## 2014-11-04 DIAGNOSIS — M9973 Connective tissue and disc stenosis of intervertebral foramina of lumbar region: Secondary | ICD-10-CM | POA: Diagnosis not present

## 2014-11-04 DIAGNOSIS — M5136 Other intervertebral disc degeneration, lumbar region: Secondary | ICD-10-CM | POA: Diagnosis not present

## 2014-11-04 DIAGNOSIS — M4186 Other forms of scoliosis, lumbar region: Secondary | ICD-10-CM | POA: Diagnosis not present

## 2014-11-04 DIAGNOSIS — M47816 Spondylosis without myelopathy or radiculopathy, lumbar region: Secondary | ICD-10-CM | POA: Diagnosis not present

## 2014-11-04 DIAGNOSIS — M4326 Fusion of spine, lumbar region: Secondary | ICD-10-CM | POA: Diagnosis not present

## 2014-11-10 DIAGNOSIS — M47816 Spondylosis without myelopathy or radiculopathy, lumbar region: Secondary | ICD-10-CM | POA: Diagnosis not present

## 2014-11-10 DIAGNOSIS — E291 Testicular hypofunction: Secondary | ICD-10-CM | POA: Diagnosis not present

## 2014-11-10 DIAGNOSIS — N401 Enlarged prostate with lower urinary tract symptoms: Secondary | ICD-10-CM | POA: Diagnosis not present

## 2014-11-19 DIAGNOSIS — M5416 Radiculopathy, lumbar region: Secondary | ICD-10-CM | POA: Diagnosis not present

## 2014-11-19 DIAGNOSIS — M47816 Spondylosis without myelopathy or radiculopathy, lumbar region: Secondary | ICD-10-CM | POA: Diagnosis not present

## 2014-11-19 DIAGNOSIS — M4726 Other spondylosis with radiculopathy, lumbar region: Secondary | ICD-10-CM | POA: Diagnosis not present

## 2014-11-19 DIAGNOSIS — M4806 Spinal stenosis, lumbar region: Secondary | ICD-10-CM | POA: Diagnosis not present

## 2014-12-13 ENCOUNTER — Other Ambulatory Visit: Payer: Self-pay

## 2014-12-21 ENCOUNTER — Telehealth: Payer: Self-pay | Admitting: *Deleted

## 2014-12-21 ENCOUNTER — Other Ambulatory Visit: Payer: Self-pay | Admitting: *Deleted

## 2014-12-21 NOTE — Telephone Encounter (Signed)
TC from pt's wife regarding patient's lab work scheduled for 12/30/14. Wife wanted to make sure Beta 2 microglobulin was on the list. Assured her that this was on his lab work list.  No other needs identified.

## 2014-12-22 ENCOUNTER — Encounter: Payer: Self-pay | Admitting: Internal Medicine

## 2014-12-22 ENCOUNTER — Ambulatory Visit (INDEPENDENT_AMBULATORY_CARE_PROVIDER_SITE_OTHER): Payer: Medicare Other | Admitting: Internal Medicine

## 2014-12-22 VITALS — BP 90/52 | HR 72 | Ht 70.0 in | Wt 137.0 lb

## 2014-12-22 DIAGNOSIS — R1013 Epigastric pain: Secondary | ICD-10-CM

## 2014-12-22 DIAGNOSIS — I2510 Atherosclerotic heart disease of native coronary artery without angina pectoris: Secondary | ICD-10-CM

## 2014-12-22 NOTE — Progress Notes (Signed)
   Subjective:    Patient ID: Willie Garcia, male    DOB: [birth year], 69 y.o.   MRN: [id number] Cc: f/u chest pain, bloating HPI He is much better - using PPI, Reglan and dicyclomine prn.  Wt Readings from Last 3 Encounters:  12/22/14 137 lb (62.143 kg)  10/05/14 144 lb 3.2 oz (65.409 kg)  09/06/14 143 lb 8 oz (65.091 kg)   Weight is down some but has fluctuated over the years.   Medications, allergies, past medical history, past surgical history, family history and social history are reviewed and updated in the EMR.  Review of Systems As above.    Objective:   Physical Exam BP 90/52 mmHg  Pulse 72  Ht 5\' 10"  (1.778 m)  Wt 137 lb (62.143 kg)  BMI 19.66 kg/m2 Thin, NAD    Assessment & Plan:  Dyspepsia Improved Continue current rx See me prn    FP:OIPP,[id number] R, MD

## 2014-12-22 NOTE — Patient Instructions (Signed)
   Glad to hear you are better. See you in a year sooner if needed.  [name], MD, [facility]

## 2014-12-22 NOTE — Assessment & Plan Note (Signed)
Improved Continue current rx See me prn

## 2014-12-30 ENCOUNTER — Other Ambulatory Visit (HOSPITAL_BASED_OUTPATIENT_CLINIC_OR_DEPARTMENT_OTHER): Payer: Medicare Other

## 2014-12-30 DIAGNOSIS — Z8572 Personal history of non-Hodgkin lymphomas: Secondary | ICD-10-CM

## 2014-12-30 DIAGNOSIS — C8290 Follicular lymphoma, unspecified, unspecified site: Secondary | ICD-10-CM

## 2014-12-30 DIAGNOSIS — C8580 Other specified types of non-Hodgkin lymphoma, unspecified site: Secondary | ICD-10-CM | POA: Diagnosis not present

## 2014-12-30 LAB — COMPREHENSIVE METABOLIC PANEL (CC13)
ALT: 20 U/L (ref 0–55)
AST: 19 U/L (ref 5–34)
Albumin: 3.8 g/dL (ref 3.5–5.0)
Alkaline Phosphatase: 72 U/L (ref 40–150)
Anion Gap: 6 mEq/L (ref 3–11)
BUN: 14.7 mg/dL (ref 7.0–26.0)
CO2: 27 mEq/L (ref 22–29)
Calcium: 9.3 mg/dL (ref 8.4–10.4)
Chloride: 105 mEq/L (ref 98–109)
Creatinine: 0.9 mg/dL (ref 0.7–1.3)
EGFR: 87 mL/min/{1.73_m2} — ABNORMAL LOW (ref >90)
Glucose: 88 mg/dl (ref 70–140)
Potassium: 4.2 mEq/L (ref 3.5–5.1)
Sodium: 139 mEq/L (ref 136–145)
Total Bilirubin: 0.82 mg/dL (ref 0.20–1.20)
Total Protein: 6.4 g/dL (ref 6.4–8.3)

## 2014-12-30 LAB — CBC WITH DIFFERENTIAL/PLATELET
BASO%: 0.8 % (ref 0.0–2.0)
Basophils Absolute: 0 10*3/uL (ref 0.0–0.1)
EOS%: 2.2 % (ref 0.0–7.0)
Eosinophils Absolute: 0.1 10*3/uL (ref 0.0–0.5)
HCT: 38.8 % (ref 38.4–49.9)
HGB: 13 g/dL (ref 13.0–17.1)
LYMPH%: 21.1 % (ref 14.0–49.0)
MCH: 30.9 pg (ref 27.2–33.4)
MCHC: 33.5 g/dL (ref 32.0–36.0)
MCV: 92.2 fL (ref 79.3–98.0)
MONO#: 0.4 10*3/uL (ref 0.1–0.9)
MONO%: 8.1 % (ref 0.0–14.0)
NEUT#: 3.6 10*3/uL (ref 1.5–6.5)
NEUT%: 67.8 % (ref 39.0–75.0)
Platelets: 200 10*3/uL (ref 140–400)
RBC: 4.21 10*6/uL (ref 4.20–5.82)
RDW: 14 % (ref 11.0–14.6)
WBC: 5.4 10*3/uL (ref 4.0–10.3)
lymph#: 1.1 10*3/uL (ref 0.9–3.3)

## 2014-12-30 LAB — FERRITIN CHCC: Ferritin: 213 ng/ml (ref 22–316)

## 2014-12-30 LAB — LACTATE DEHYDROGENASE (CC13): LDH: 141 U/L (ref 125–245)

## 2015-01-03 DIAGNOSIS — N5201 Erectile dysfunction due to arterial insufficiency: Secondary | ICD-10-CM | POA: Diagnosis not present

## 2015-01-03 DIAGNOSIS — N401 Enlarged prostate with lower urinary tract symptoms: Secondary | ICD-10-CM | POA: Diagnosis not present

## 2015-01-03 DIAGNOSIS — E291 Testicular hypofunction: Secondary | ICD-10-CM | POA: Diagnosis not present

## 2015-01-03 DIAGNOSIS — R351 Nocturia: Secondary | ICD-10-CM | POA: Diagnosis not present

## 2015-01-03 LAB — BETA 2 MICROGLOBULIN, SERUM: Beta-2 Microglobulin: 1.84 mg/L (ref <=2.51)

## 2015-01-06 ENCOUNTER — Other Ambulatory Visit: Payer: Medicare Other

## 2015-01-06 ENCOUNTER — Telehealth: Payer: Self-pay | Admitting: Oncology

## 2015-01-06 ENCOUNTER — Ambulatory Visit (HOSPITAL_BASED_OUTPATIENT_CLINIC_OR_DEPARTMENT_OTHER): Payer: Medicare Other | Admitting: Oncology

## 2015-01-06 VITALS — BP 116/69 | HR 67 | Temp 98.2°F | Resp 18 | Ht 70.0 in | Wt 136.5 lb

## 2015-01-06 DIAGNOSIS — R634 Abnormal weight loss: Secondary | ICD-10-CM | POA: Diagnosis not present

## 2015-01-06 DIAGNOSIS — M545 Low back pain: Secondary | ICD-10-CM

## 2015-01-06 DIAGNOSIS — Z8572 Personal history of non-Hodgkin lymphomas: Secondary | ICD-10-CM | POA: Diagnosis not present

## 2015-01-06 DIAGNOSIS — G8929 Other chronic pain: Secondary | ICD-10-CM

## 2015-01-06 DIAGNOSIS — R5383 Other fatigue: Secondary | ICD-10-CM | POA: Diagnosis not present

## 2015-01-06 DIAGNOSIS — C8590 Non-Hodgkin lymphoma, unspecified, unspecified site: Secondary | ICD-10-CM

## 2015-01-06 MED ORDER — PREDNISONE 5 MG PO TABS: 10.0000 mg | ORAL_TABLET | Freq: Every day | ORAL | Status: DC

## 2015-01-06 NOTE — Telephone Encounter (Signed)
Pt confirmed labs/ov per 07/21 POF, gave pt AVS and Calendar... KJ

## 2015-01-06 NOTE — Progress Notes (Signed)
ID: [name]   DOB: Mar 13, 1946  MR#: [id number]  GUR#:[id number]  EGB:[id number] R, MD SU: OTHER MD: Willie Garcia, Willie Garcia, Willie Garcia,  Willie Garcia     HISTORY OF PRESENT ILLNESS:  from the original intake note:  Willie Garcia has long had back problems and has been followed for this by Dr. Ellene Garcia.  On November 20, 2005, the patient had an MRI of the lumbar spine without contrast for evaluation of his chronic progressive low back and left buttock pain.  This showed confluent retroperitoneal adenopathy encasing the renal vessels and measuring up to 5.2 x 3.4 cm transversely. There was no epidural mass and the kidneys appeared unremarkable.  This had been compared with an MRI from Nov 09, 2004 where no such adenopathy was noted.    The patient's wife, Willie Garcia, is a Museum/gallery curator at [name], so she brought this to the attention of Willie Garcia and a CT Scan of the chest, abdomen and pelvis was obtained there on June 11th.  It was ready by Willie Garcia, showing some small mediastinal lymph nodes, the largest being 1.4 cm, but significant adenopathy surrounding the left renal vein with anterior displacement of that vein as well as the inferior vena cava.  This measured 5.2 cm in transverse diameter and 3.8 cm in AP diameter.  The adenopathy extended inferiorly to the level of the aortic bifurcation.  There were small mesenteric lymph nodes, all less than 12 mm. The spleen was upper normal in size with no focal lesions.  There was no pelvic adenopathy.  His subsequent history is as detailed below  INTERVAL HISTORY: Willie Garcia returns today for followup of his non-Hodgkin's lymphoma accompanied by his wife Willie Garcia. This is last visit here he had an abnormal stress test followed by cardiac catheterization under Willie Garcia.  This found an occluded mid left anterior descending artery but  They tend LIMA. The SVG to the diagonal was occluded. There was moderate ostial disease of the left circumflex. SVG to the  distal obtuse marginal was widely patent. The right coronary was occluded but the SVG to the RCA was patent as well. Ejection fraction was 68%. A decision was made to continue medical therapy  REVIEW OF SYSTEMS: His weight Had risen to about 147 in January. Possibly some of that may have been fluid. I didn't irradiate it is now down to 136. He describes himself as frequently extremely fatigued. He manages to get in the car and drive around , but is not otherwise very active. Sometimes he has chills, although he denies fevers he also denies any rash, adenopathy, or bleeding problems. He sleeps poorly. He has significant back pain but he is getting some relief from epidurals that he gets under Dr. Ellene Garcia. He had a basal cell removed earlier this year. A detailed review of systems today was otherwise stable  PAST MEDICAL HISTORY: Past Medical History  Diagnosis Date  . Allergic rhinitis   . Non Hodgkin's lymphoma   . CAD (coronary artery disease)   . Depression   . GERD (gastroesophageal reflux disease)   . Hyperlipidemia   . HTN (hypertension)   . Osteopenia   . Anemia   . Anxiety   . Scoliosis   . BPH (benign prostatic hyperplasia)   . Personal history of colonic adenomas 04/04/2010  . 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 Basal cell carcinoma of skin 2015    PAST SURGICAL HISTORY: Past Surgical History  Procedure Laterality Date  . Coronary artery bypass graft    . Rhinoplasty    . Hernia repair    . Colonoscopy  06/2013    negative  . Upper gastrointestinal endoscopy    . Cervical spine surgery      C4-C6  . Cardiac catheterization  07/14/2014    Procedure: LEFT HEART CATH AND CORS/GRAFTS ANGIOGRAPHY;  Surgeon: Willie Booze, MD;  Location: [facility] CATH LAB;  Service: Cardiovascular;;  . Cardiac catheterization  07/14/2014    Procedure: INTRAVASCULAR PRESSURE WIRE/FFR STUDY;  Surgeon: Willie Booze, MD;  Location: [facility] CATH LAB;  Service: Cardiovascular;;  circ     FAMILY HISTORY Family History  Problem Relation Age of Onset  . Colon cancer Neg Hx   . Stomach cancer Neg Hx   . Esophageal cancer Neg Hx   . Rectal cancer Neg Hx   . Heart disease Mother   . Heart attack Mother 56  . Heart disease Father   . Heart attack Father 29  . Heart attack Brother 73  . Diabetes Brother   . Aneurysm Paternal Uncle   . Aneurysm Maternal Grandmother   . Heart attack Maternal Grandmother   . Aneurysm Maternal Grandfather   . Heart attack Maternal Grandfather   The patient's father died at the age of 12 from heart problems.  The patient's mother died at the age of 65 from heart problems.  The patient has three brothers and two sisters.  The only cancer in the family known to the patient is the maternal grandmother, who had breast cancer diagnosed in her fifties or 5.    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  He has been married to [place] for >30 years.  They have no children of their own. They attend [employer]   ADVANCED DIRECTIVES:  HEALTH MAINTENANCE: History  Substance Use Topics  . Smoking status: Never Smoker   . Smokeless tobacco: Never Used  . Alcohol Use: No     Colonoscopy: JAN 2015 (with EGD)-- WNL  PSA:  Bone density:  Lipid panel:  No Known Allergies  Current Outpatient Prescriptions  Medication Sig Dispense Refill  . acetaminophen (TYLENOL) 325 MG tablet Take 2 tablets (650 mg total) by mouth 3 (three) times daily.    Marland Kitchen aspirin 81 MG tablet Take 1 tablet (81 mg total) by mouth daily.    Marland Kitchen CALCIUM-VITAMIN D PO Take 1 tablet by mouth 2 (two) times daily. (600-200)    . Cholecalciferol (VITAMIN D3) 2000 UNITS TABS Take 2,000 Units by mouth daily.    . Cyanocobalamin (VITAMIN B-12 IJ) Inject 1 mL as directed every 30 (thirty) days.     Marland Kitchen dicyclomine (BENTYL) 20 MG tablet Take 1 tablet (20 mg total) by mouth every 6 (six) hours as needed for spasms. 120 tablet 5  .  ezetimibe (ZETIA) 10 MG tablet Take 10 mg by mouth daily.    . fluticasone (FLONASE) 50 MCG/ACT nasal spray Place 2 sprays into both nostrils daily as needed for allergies or rhinitis.     . folic acid (FOLVITE) 1 MG tablet Take 1 mg by mouth daily.      Marland Kitchen loratadine (CLARITIN) 10 MG tablet Take 10 mg by mouth daily as needed for allergies.    Marland Kitchen metoCLOPramide (REGLAN) 5 MG tablet Take one tablet by mouth three times a day before meals as needed (Patient taking differently: Take one tablet by mouth three times a  day before meals as needed stomach issues) 90 tablet 1  . metoprolol succinate (TOPROL-XL) 25 MG 24 hr tablet Take 1 tablet (25 mg total) by mouth daily. 90 tablet 2  . Multiple Vitamin (MULTIVITAMIN) capsule Take 1 capsule by mouth daily.      . nitroGLYCERIN (NITROSTAT) 0.4 MG SL tablet Place 1 tablet (0.4 mg total) under the tongue every 5 (five) minutes as needed for chest pain. (Patient taking differently: Place 0.4 mg under the tongue every 5 (five) minutes as needed for chest pain (MAX 3 TABLETS). ) 10 tablet 0  . pantoprazole (PROTONIX) 20 MG tablet Take 1 tablet (20 mg total) by mouth daily before breakfast. 30 tablet 11  . predniSONE (DELTASONE) 5 MG tablet Take 2 tablets (10 mg total) by mouth daily with breakfast. 60 tablet 4  . rosuvastatin (CRESTOR) 20 MG tablet Take 20 mg by mouth daily.      Marland Kitchen testosterone cypionate (DEPOTESTOTERONE CYPIONATE) 200 MG/ML injection Inject 50 mg into the muscle once a week. AS DIRECTED    . traMADol (ULTRAM) 50 MG tablet Take 1 tablet (50 mg total) by mouth every 12 (twelve) hours as needed. (Patient taking differently: Take 50 mg by mouth every 12 (twelve) hours as needed (pain). ) 15 tablet 0  . vitamin C (ASCORBIC ACID) 500 MG tablet Take 500 mg by mouth daily.     No current facility-administered medications for this visit.    OBJECTIVE: Middle-aged white man who appears mildly cachectic Filed Vitals:   01/06/15 1605  BP: 116/69  Pulse:  67  Temp: 98.2 F (36.8 C)  Resp: 18     Body mass index is 19.59 kg/(m^2).    ECOG FS: 1  Sclerae unicteric, pupils round and equal Oropharynx clear and moist-- no thrush or other lesions No cervical or supraclavicular adenopathy , no cervical or inguinal adenopathy Lungs no rales or rhonchi Heart regular rate and rhythm Abd soft, nontender, positive bowel sounds MSK no focal spinal tenderness, no upper extremity lymphedema Neuro: nonfocal, well oriented, appropriate affect     LAB RESULTS: Lab Results  Component Value Date   WBC 5.4 12/30/2014   NEUTROABS 3.6 12/30/2014   HGB 13.0 12/30/2014   HCT 38.8 12/30/2014   MCV 92.2 12/30/2014   PLT 200 12/30/2014      Chemistry      Component Value Date/Time   NA 139 12/30/2014 1024   NA 139 07/14/2014 0540   K 4.2 12/30/2014 1024   K 3.7 07/14/2014 0540   CL 104 07/14/2014 0540   CL 101 06/30/2012 1325   CO2 27 12/30/2014 1024   CO2 27 07/14/2014 0540   BUN 14.7 12/30/2014 1024   BUN 10 07/14/2014 0540   CREATININE 0.9 12/30/2014 1024   CREATININE 0.96 07/14/2014 0540      Component Value Date/Time   CALCIUM 9.3 12/30/2014 1024   CALCIUM 8.5 07/14/2014 0540   ALKPHOS 72 12/30/2014 1024   ALKPHOS 60 07/13/2014 0435   AST 19 12/30/2014 1024   AST 21 07/13/2014 0435   ALT 20 12/30/2014 1024   ALT 18 07/13/2014 0435   BILITOT 0.82 12/30/2014 1024   BILITOT 0.7 07/13/2014 0435       No results found for: LABCA2  No components found for: OACZY606  No results for input(s): INR in the last 168 hours.  Urinalysis No results found for: COLORURINE  STUDIES: EXAM: MYOCARDIAL IMAGING WITH SPECT (REST AND PHARMACOLOGIC-STRESS)  GATED LEFT VENTRICULAR WALL MOTION  STUDY  LEFT VENTRICULAR EJECTION FRACTION  TECHNIQUE: Standard myocardial SPECT imaging was performed after resting intravenous injection of 10 mCi Tc-12m sestamibi. Subsequently, intravenous infusion of Lexiscan was performed under the  supervision of the Cardiology staff. At peak effect of the drug, 30 mCi Tc-74m sestamibi was injected intravenously and standard myocardial SPECT imaging was performed. Quantitative gated imaging was also performed to evaluate left ventricular wall motion, and estimate left ventricular ejection fraction.  COMPARISON: One-view chest x-ray 07/13/2014  FINDINGS: Perfusion: There is some baseline decreased perfusion within the apical portion of the anterior wall. There is a large area of inducible ischemia on the stress related images.  Wall Motion: Mild hypokinesis is noted along the anterior wall near the apex. Wall motion is otherwise normal.  Left Ventricular Ejection Fraction: 68 %  End diastolic volume 82 ml  End systolic volume 26 ml  IMPRESSION: 1. Focal small area of reversible ischemia associated with a smaller chronic infarct within the apical portion of the anterior wall.  2. Mild hypokinesis within the area of inducible ischemia.  3. Left ventricular ejection fraction 68%  4. Intermediate-risk stress test findings*.  *2012 Appropriate Use Criteria for Coronary Revascularization Focused Update: J Am Coll Cardiol. 1062;69(4):854-627. http://content.airportbarriers.com.aspx?articleid=1201161   Electronically Signed  By: Lawrence Santiago M.D.  On: 07/13/2014 15:55  ASSESSMENT: 69 y.o. 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   PLAN:   Grade his lab work is perfect, including the beta-2 microglobulin. However he continues to lose weight, and he is extremely fatigued. We don't have a simple explanation for this and therefore we have to think that possibly his lymphoma is  active.  Accordingly I am going to obtain a PET scan. Of course if we do have active disease we will return to rituximab, which she tolerated well.    once we have the PET scan and no what to do about that , if there is no evidence of disease activity, I think we should start prednisone 10 mg every morning and see if that gives Willie Garcia a little bit more energy and a little bit more appetite. We also discussed Marinol and Megace. If the prednisone works well though at 10 mg after a month we would drop the dose 25 mg in the morning and see if that continues to function. Other wise we would go to 5 in the morning and  2 on a half milligrams in the evening   I will call him with a PET results and Willie Garcia will call me with monthly reports on grade his weight and functional status. Assuming all goes well I will continue to see him on a yearly basis.  MAGRINAT,GUSTAV C    01/06/2015

## 2015-01-10 ENCOUNTER — Other Ambulatory Visit: Payer: Self-pay | Admitting: *Deleted

## 2015-01-17 ENCOUNTER — Ambulatory Visit (HOSPITAL_COMMUNITY): Payer: Medicare Other

## 2015-01-20 ENCOUNTER — Ambulatory Visit (HOSPITAL_COMMUNITY)
Admission: RE | Admit: 2015-01-20 | Discharge: 2015-01-20 | Disposition: A | Discharge status: Home / Self Care | Payer: Medicare Other | Source: Ambulatory Visit | Attending: Oncology | Admitting: Oncology

## 2015-01-20 ENCOUNTER — Encounter (HOSPITAL_COMMUNITY): Payer: Self-pay

## 2015-01-20 DIAGNOSIS — C8590 Non-Hodgkin lymphoma, unspecified, unspecified site: Secondary | ICD-10-CM | POA: Diagnosis not present

## 2015-01-20 DIAGNOSIS — Z79899 Other long term (current) drug therapy: Secondary | ICD-10-CM | POA: Insufficient documentation

## 2015-01-20 DIAGNOSIS — R911 Solitary pulmonary nodule: Secondary | ICD-10-CM | POA: Insufficient documentation

## 2015-01-20 LAB — GLUCOSE, CAPILLARY: Glucose-Capillary: 85 mg/dL (ref 65–99)

## 2015-01-20 MED ORDER — FLUDEOXYGLUCOSE F - 18 (FDG) INJECTION
7.6000 | Freq: Once | INTRAVENOUS | Status: AC | PRN
  Administered 2015-01-20: 7.6 via INTRAVENOUS

## 2015-01-21 DIAGNOSIS — H524 Presbyopia: Secondary | ICD-10-CM | POA: Diagnosis not present

## 2015-01-21 DIAGNOSIS — H25813 Combined forms of age-related cataract, bilateral: Secondary | ICD-10-CM | POA: Diagnosis not present

## 2015-01-21 DIAGNOSIS — M5442 Lumbago with sciatica, left side: Secondary | ICD-10-CM | POA: Diagnosis not present

## 2015-01-21 DIAGNOSIS — H43813 Vitreous degeneration, bilateral: Secondary | ICD-10-CM | POA: Diagnosis not present

## 2015-02-01 ENCOUNTER — Encounter: Payer: Self-pay | Admitting: Oncology

## 2015-02-01 ENCOUNTER — Other Ambulatory Visit: Payer: Self-pay | Admitting: Oncology

## 2015-02-02 DIAGNOSIS — L570 Actinic keratosis: Secondary | ICD-10-CM | POA: Diagnosis not present

## 2015-02-02 DIAGNOSIS — L814 Other melanin hyperpigmentation: Secondary | ICD-10-CM | POA: Diagnosis not present

## 2015-02-02 DIAGNOSIS — D1801 Hemangioma of skin and subcutaneous tissue: Secondary | ICD-10-CM | POA: Diagnosis not present

## 2015-02-02 DIAGNOSIS — D225 Melanocytic nevi of trunk: Secondary | ICD-10-CM | POA: Diagnosis not present

## 2015-02-02 DIAGNOSIS — Z85828 Personal history of other malignant neoplasm of skin: Secondary | ICD-10-CM | POA: Diagnosis not present

## 2015-02-17 DIAGNOSIS — L02612 Cutaneous abscess of left foot: Secondary | ICD-10-CM | POA: Diagnosis not present

## 2015-02-17 DIAGNOSIS — L0291 Cutaneous abscess, unspecified: Secondary | ICD-10-CM | POA: Diagnosis not present

## 2015-02-22 DIAGNOSIS — M79675 Pain in left toe(s): Secondary | ICD-10-CM | POA: Diagnosis not present

## 2015-02-23 ENCOUNTER — Telehealth: Payer: Self-pay | Admitting: *Deleted

## 2015-02-23 NOTE — Telephone Encounter (Signed)
Patient wife called stating that patient is having difficulty with maintaining a good weight. Wife states that patient is not taking prednisone as described and weight is 129 lbs. She would like to know any recommendation that MD Magrinat has for him. Message sent to MD Magrinat/RN Val.

## 2015-02-24 DIAGNOSIS — M2550 Pain in unspecified joint: Secondary | ICD-10-CM | POA: Diagnosis not present

## 2015-02-24 DIAGNOSIS — Z23 Encounter for immunization: Secondary | ICD-10-CM | POA: Diagnosis not present

## 2015-02-24 DIAGNOSIS — F4321 Adjustment disorder with depressed mood: Secondary | ICD-10-CM | POA: Diagnosis not present

## 2015-02-24 DIAGNOSIS — C8590 Non-Hodgkin lymphoma, unspecified, unspecified site: Secondary | ICD-10-CM | POA: Diagnosis not present

## 2015-02-24 DIAGNOSIS — E785 Hyperlipidemia, unspecified: Secondary | ICD-10-CM | POA: Diagnosis not present

## 2015-02-24 DIAGNOSIS — Z6820 Body mass index (BMI) 20.0-20.9, adult: Secondary | ICD-10-CM | POA: Diagnosis not present

## 2015-02-24 DIAGNOSIS — I10 Essential (primary) hypertension: Secondary | ICD-10-CM | POA: Diagnosis not present

## 2015-02-24 DIAGNOSIS — I2510 Atherosclerotic heart disease of native coronary artery without angina pectoris: Secondary | ICD-10-CM | POA: Diagnosis not present

## 2015-02-24 DIAGNOSIS — E291 Testicular hypofunction: Secondary | ICD-10-CM | POA: Diagnosis not present

## 2015-02-24 DIAGNOSIS — R630 Anorexia: Secondary | ICD-10-CM | POA: Diagnosis not present

## 2015-03-03 ENCOUNTER — Other Ambulatory Visit: Payer: Self-pay | Admitting: *Deleted

## 2015-03-03 DIAGNOSIS — C8290 Follicular lymphoma, unspecified, unspecified site: Secondary | ICD-10-CM

## 2015-03-03 MED ORDER — DRONABINOL 2.5 MG PO CAPS: 2.5000 mg | ORAL_CAPSULE | Freq: Two times a day (BID) | ORAL | Status: DC

## 2015-03-08 ENCOUNTER — Encounter: Payer: Self-pay | Admitting: Oncology

## 2015-03-08 NOTE — Progress Notes (Signed)
Per tyler at [facility].   Prior auth for dronabinol went into review and we will be contacted on status.

## 2015-03-09 ENCOUNTER — Encounter: Payer: Self-pay | Admitting: Oncology

## 2015-03-09 NOTE — Progress Notes (Signed)
I faxed prior auth form to [facility]

## 2015-03-09 NOTE — Progress Notes (Signed)
Per [facility] dronabinol approved thru 09/05/15  Pa# [id number]. I sent to medical records

## 2015-03-22 DIAGNOSIS — M722 Plantar fascial fibromatosis: Secondary | ICD-10-CM | POA: Diagnosis not present

## 2015-04-04 DIAGNOSIS — R1314 Dysphagia, pharyngoesophageal phase: Secondary | ICD-10-CM | POA: Diagnosis not present

## 2015-04-04 DIAGNOSIS — R05 Cough: Secondary | ICD-10-CM | POA: Diagnosis not present

## 2015-04-04 DIAGNOSIS — K219 Gastro-esophageal reflux disease without esophagitis: Secondary | ICD-10-CM | POA: Diagnosis not present

## 2015-07-05 DIAGNOSIS — N401 Enlarged prostate with lower urinary tract symptoms: Secondary | ICD-10-CM | POA: Diagnosis not present

## 2015-07-05 DIAGNOSIS — E291 Testicular hypofunction: Secondary | ICD-10-CM | POA: Diagnosis not present

## 2015-07-07 DIAGNOSIS — N401 Enlarged prostate with lower urinary tract symptoms: Secondary | ICD-10-CM | POA: Diagnosis not present

## 2015-07-07 DIAGNOSIS — Z0000 Encounter for general adult medical examination without abnormal findings: Secondary | ICD-10-CM | POA: Diagnosis not present

## 2015-07-07 DIAGNOSIS — R3915 Urgency of urination: Secondary | ICD-10-CM | POA: Diagnosis not present

## 2015-07-07 DIAGNOSIS — N138 Other obstructive and reflux uropathy: Secondary | ICD-10-CM | POA: Diagnosis not present

## 2015-07-07 DIAGNOSIS — E291 Testicular hypofunction: Secondary | ICD-10-CM | POA: Diagnosis not present

## 2015-08-16 DIAGNOSIS — L02612 Cutaneous abscess of left foot: Secondary | ICD-10-CM | POA: Diagnosis not present

## 2015-08-16 DIAGNOSIS — M79675 Pain in left toe(s): Secondary | ICD-10-CM | POA: Diagnosis not present

## 2015-08-16 DIAGNOSIS — L608 Other nail disorders: Secondary | ICD-10-CM | POA: Diagnosis not present

## 2015-08-16 DIAGNOSIS — L0291 Cutaneous abscess, unspecified: Secondary | ICD-10-CM | POA: Diagnosis not present

## 2015-08-19 DIAGNOSIS — L608 Other nail disorders: Secondary | ICD-10-CM | POA: Diagnosis not present

## 2015-08-23 DIAGNOSIS — M79675 Pain in left toe(s): Secondary | ICD-10-CM | POA: Diagnosis not present

## 2015-08-23 DIAGNOSIS — L02612 Cutaneous abscess of left foot: Secondary | ICD-10-CM | POA: Diagnosis not present

## 2015-11-07 DIAGNOSIS — H02831 Dermatochalasis of right upper eyelid: Secondary | ICD-10-CM | POA: Diagnosis not present

## 2015-11-07 DIAGNOSIS — H25813 Combined forms of age-related cataract, bilateral: Secondary | ICD-10-CM | POA: Diagnosis not present

## 2015-11-07 DIAGNOSIS — H5310 Unspecified subjective visual disturbances: Secondary | ICD-10-CM | POA: Diagnosis not present

## 2015-11-07 DIAGNOSIS — H02834 Dermatochalasis of left upper eyelid: Secondary | ICD-10-CM | POA: Diagnosis not present

## 2015-11-09 DIAGNOSIS — M4120 Other idiopathic scoliosis, site unspecified: Secondary | ICD-10-CM | POA: Diagnosis not present

## 2015-11-09 DIAGNOSIS — M419 Scoliosis, unspecified: Secondary | ICD-10-CM | POA: Diagnosis not present

## 2015-11-17 DIAGNOSIS — I10 Essential (primary) hypertension: Secondary | ICD-10-CM | POA: Diagnosis not present

## 2015-11-17 DIAGNOSIS — Z6820 Body mass index (BMI) 20.0-20.9, adult: Secondary | ICD-10-CM | POA: Diagnosis not present

## 2015-11-17 DIAGNOSIS — C8590 Non-Hodgkin lymphoma, unspecified, unspecified site: Secondary | ICD-10-CM | POA: Diagnosis not present

## 2015-11-17 DIAGNOSIS — F4321 Adjustment disorder with depressed mood: Secondary | ICD-10-CM | POA: Diagnosis not present

## 2015-11-17 DIAGNOSIS — E784 Other hyperlipidemia: Secondary | ICD-10-CM | POA: Diagnosis not present

## 2015-11-17 DIAGNOSIS — I2510 Atherosclerotic heart disease of native coronary artery without angina pectoris: Secondary | ICD-10-CM | POA: Diagnosis not present

## 2015-11-17 DIAGNOSIS — M5136 Other intervertebral disc degeneration, lumbar region: Secondary | ICD-10-CM | POA: Diagnosis not present

## 2015-11-25 DIAGNOSIS — M5416 Radiculopathy, lumbar region: Secondary | ICD-10-CM | POA: Diagnosis not present

## 2015-11-25 DIAGNOSIS — M5136 Other intervertebral disc degeneration, lumbar region: Secondary | ICD-10-CM | POA: Diagnosis not present

## 2015-11-25 DIAGNOSIS — M4186 Other forms of scoliosis, lumbar region: Secondary | ICD-10-CM | POA: Diagnosis not present

## 2015-12-19 DIAGNOSIS — Z6820 Body mass index (BMI) 20.0-20.9, adult: Secondary | ICD-10-CM | POA: Diagnosis not present

## 2015-12-19 DIAGNOSIS — M5136 Other intervertebral disc degeneration, lumbar region: Secondary | ICD-10-CM | POA: Diagnosis not present

## 2016-01-06 ENCOUNTER — Other Ambulatory Visit (HOSPITAL_BASED_OUTPATIENT_CLINIC_OR_DEPARTMENT_OTHER): Payer: Medicare Other

## 2016-01-06 DIAGNOSIS — C8590 Non-Hodgkin lymphoma, unspecified, unspecified site: Secondary | ICD-10-CM

## 2016-01-06 DIAGNOSIS — Z8572 Personal history of non-Hodgkin lymphomas: Secondary | ICD-10-CM

## 2016-01-06 LAB — COMPREHENSIVE METABOLIC PANEL
ALT: 9 U/L (ref 0–55)
AST: 13 U/L (ref 5–34)
Albumin: 3.9 g/dL (ref 3.5–5.0)
Alkaline Phosphatase: 80 U/L (ref 40–150)
Anion Gap: 8 mEq/L (ref 3–11)
BUN: 15.5 mg/dL (ref 7.0–26.0)
CO2: 29 mEq/L (ref 22–29)
Calcium: 9.5 mg/dL (ref 8.4–10.4)
Chloride: 101 mEq/L (ref 98–109)
Creatinine: 1 mg/dL (ref 0.7–1.3)
EGFR: 79 mL/min/{1.73_m2} — ABNORMAL LOW (ref >90)
Glucose: 93 mg/dl (ref 70–140)
Potassium: 4.5 mEq/L (ref 3.5–5.1)
Sodium: 138 mEq/L (ref 136–145)
Total Bilirubin: 0.95 mg/dL (ref 0.20–1.20)
Total Protein: 6.9 g/dL (ref 6.4–8.3)

## 2016-01-06 LAB — CBC WITH DIFFERENTIAL/PLATELET
BASO%: 0.3 % (ref 0.0–2.0)
Basophils Absolute: 0 10*3/uL (ref 0.0–0.1)
EOS%: 3.9 % (ref 0.0–7.0)
Eosinophils Absolute: 0.2 10*3/uL (ref 0.0–0.5)
HCT: 38.8 % (ref 38.4–49.9)
HGB: 13 g/dL (ref 13.0–17.1)
LYMPH%: 22.1 % (ref 14.0–49.0)
MCH: 31.1 pg (ref 27.2–33.4)
MCHC: 33.5 g/dL (ref 32.0–36.0)
MCV: 92.8 fL (ref 79.3–98.0)
MONO#: 0.6 10*3/uL (ref 0.1–0.9)
MONO%: 9.8 % (ref 0.0–14.0)
NEUT#: 3.9 10*3/uL (ref 1.5–6.5)
NEUT%: 63.9 % (ref 39.0–75.0)
Platelets: 211 10*3/uL (ref 140–400)
RBC: 4.18 10*6/uL — ABNORMAL LOW (ref 4.20–5.82)
RDW: 13.4 % (ref 11.0–14.6)
WBC: 6.1 10*3/uL (ref 4.0–10.3)
lymph#: 1.4 10*3/uL (ref 0.9–3.3)

## 2016-01-10 DIAGNOSIS — M859 Disorder of bone density and structure, unspecified: Secondary | ICD-10-CM | POA: Diagnosis not present

## 2016-01-12 ENCOUNTER — Ambulatory Visit (HOSPITAL_BASED_OUTPATIENT_CLINIC_OR_DEPARTMENT_OTHER): Payer: Medicare Other | Admitting: Oncology

## 2016-01-12 ENCOUNTER — Telehealth: Payer: Self-pay | Admitting: Oncology

## 2016-01-12 VITALS — BP 98/72 | HR 66 | Temp 98.1°F | Resp 18 | Ht 70.0 in | Wt 136.8 lb

## 2016-01-12 DIAGNOSIS — R634 Abnormal weight loss: Secondary | ICD-10-CM | POA: Diagnosis not present

## 2016-01-12 DIAGNOSIS — M545 Low back pain: Secondary | ICD-10-CM | POA: Diagnosis not present

## 2016-01-12 DIAGNOSIS — Z8572 Personal history of non-Hodgkin lymphomas: Secondary | ICD-10-CM | POA: Diagnosis not present

## 2016-01-12 DIAGNOSIS — C8200 Follicular lymphoma grade I, unspecified site: Secondary | ICD-10-CM

## 2016-01-12 DIAGNOSIS — E291 Testicular hypofunction: Secondary | ICD-10-CM | POA: Diagnosis not present

## 2016-01-12 MED ORDER — ESCITALOPRAM OXALATE 10 MG PO TABS: 10.0000 mg | ORAL_TABLET | Freq: Every day | ORAL | 6 refills | Status: DC

## 2016-01-12 NOTE — Telephone Encounter (Signed)
appt made and avs printed °

## 2016-01-12 NOTE — Progress Notes (Signed)
ID: [name]   DOB: 10/31/1945  MR#: [id number]  [id number]  [id number] R, MD SU: OTHER MD: Kristeen Miss, [name], Silvano Rusk,  Lendell Caprice     HISTORY OF PRESENT ILLNESS:  from the original intake note:  Willie Garcia has long had back problems and has been followed for this by Dr. [name].  On November 20, 2005, the patient had an MRI of the lumbar spine without contrast for evaluation of his chronic progressive low back and left buttock pain.  This showed confluent retroperitoneal adenopathy encasing the renal vessels and measuring up to 5.2 x 3.4 cm transversely. There was no epidural mass and the kidneys appeared unremarkable.  This had been compared with an MRI from Nov 09, 2004 where no such adenopathy was noted.    The patient's wife, Willie Garcia, is a Museum/gallery curator at [name], so she brought this to the attention of [name] and a CT Scan of the chest, abdomen and pelvis was obtained there on June 11th.  It was ready by [name], showing some small mediastinal lymph nodes, the largest being 1.4 cm, but significant adenopathy surrounding the left renal vein with anterior displacement of that vein as well as the inferior vena cava.  This measured 5.2 cm in transverse diameter and 3.8 cm in AP diameter.  The adenopathy extended inferiorly to the level of the aortic bifurcation.  There were small mesenteric lymph nodes, all less than 12 mm. The spleen was upper normal in size with no focal lesions.  There was no pelvic adenopathy.  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 The interval history is generally stable. He has been doing a lot of work around her property, doing some house painting, and fixing some of their machines. He denies any fever, rash, or adenopathy. He denies unexplained fatigue. His weight has been stable over the past year, although it  is approximately 8 pounds below his prior baseline.  REVIEW OF SYSTEMS: He has pain in his back and hips and recently had an epidural shot which he says was helpful. The pain is intermittent but now much better. He complains of hearing loss, poor appetite, and heartburn. He feels anxious and depressed. He denies constipation, diarrhea, or any bowel changes. A detailed review of systems today was otherwise stable  PAST MEDICAL HISTORY: Past Medical History:  Diagnosis Date  . Allergic rhinitis   . Anemia   . Anxiety   . Basal cell carcinoma of skin 2015  . BPH (benign prostatic hyperplasia)   . CAD (coronary artery disease)   . Depression   . GERD (gastroesophageal reflux disease)   . HTN (hypertension)   . Hyperlipidemia   . Non Hodgkin's lymphoma   . Osteopenia   . Personal history of colonic adenomas 04/04/2010  . Scoliosis   . 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 CARDIAC CATHETERIZATION  07/14/2014   Procedure: LEFT HEART CATH AND CORS/GRAFTS ANGIOGRAPHY;  Surgeon: Jettie Booze, MD;  Location: [facility] CATH LAB;  Service: Cardiovascular;;  . CARDIAC CATHETERIZATION  07/14/2014   Procedure: INTRAVASCULAR PRESSURE WIRE/FFR STUDY;  Surgeon: Jettie Booze, MD;  Location: [facility] CATH LAB;  Service: Cardiovascular;;  circ  . CERVICAL SPINE SURGERY     C4-C6  . COLONOSCOPY  06/2013  negative  . CORONARY ARTERY BYPASS GRAFT    . HERNIA REPAIR    . RHINOPLASTY    . UPPER GASTROINTESTINAL ENDOSCOPY      FAMILY HISTORY Family History  Problem Relation Age of Onset  . Colon cancer Neg Hx   . Stomach cancer Neg Hx   . Esophageal cancer Neg Hx   . Rectal cancer Neg Hx   . Heart disease Mother   . Heart attack Mother 68  . Heart disease Father   . Heart attack Father 60  . Heart attack Brother 15  . Diabetes Brother   . Aneurysm Paternal Uncle   . Aneurysm Maternal Grandmother   . Heart  attack Maternal Grandmother   . Aneurysm Maternal Grandfather   . Heart attack Maternal Grandfather   The patient's father died at the age of 64 from heart problems.  The patient's mother died at the age of 40 from heart problems.  The patient has three brothers and two sisters.  The only cancer in the family known to the patient is the maternal grandmother, who had breast cancer diagnosed in her fifties or 44.    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  He has been married to Willie Garcia for >30 years.  They have no children of their own. They attend [employer]   ADVANCED DIRECTIVES: in place  HEALTH MAINTENANCE: Social History  Substance Use Topics  . Smoking status: Never Smoker  . Smokeless tobacco: Never Used  . Alcohol use No     Colonoscopy: JAN 2015 (with EGD)-- WNL  PSA:  Bone density:  Lipid panel:  No Known Allergies  Current Outpatient Prescriptions  Medication Sig Dispense Refill  . acetaminophen (TYLENOL) 325 MG tablet Take 2 tablets (650 mg total) by mouth 3 (three) times daily.    Marland Kitchen aspirin 81 MG tablet Take 1 tablet (81 mg total) by mouth daily.    Marland Kitchen CALCIUM-VITAMIN D PO Take 1 tablet by mouth 2 (two) times daily. (600-200)    . Cholecalciferol (VITAMIN D3) 2000 UNITS TABS Take 2,000 Units by mouth daily.    . Cyanocobalamin (VITAMIN B-12 IJ) Inject 1 mL as directed every 30 (thirty) days.     Marland Kitchen dicyclomine (BENTYL) 20 MG tablet Take 1 tablet (20 mg total) by mouth every 6 (six) hours as needed for spasms. 120 tablet 5  . dronabinol (MARINOL) 2.5 MG capsule Take 1 capsule (2.5 mg total) by mouth 2 (two) times daily before lunch and supper. 60 capsule 0  . escitalopram (LEXAPRO) 10 MG tablet Take 1 tablet (10 mg total) by mouth daily. 90 tablet 6  . ezetimibe (ZETIA) 10 MG tablet Take 10 mg by mouth daily.    . fluticasone (FLONASE) 50 MCG/ACT nasal spray Place 2 sprays into both nostrils daily as needed for  allergies or rhinitis.     . folic acid (FOLVITE) 1 MG tablet Take 1 mg by mouth daily.      Marland Kitchen loratadine (CLARITIN) 10 MG tablet Take 10 mg by mouth daily as needed for allergies.    Marland Kitchen metoCLOPramide (REGLAN) 5 MG tablet Take one tablet by mouth three times a day before meals as needed (Patient taking differently: Take one tablet by mouth three times a day before meals as needed stomach issues) 90 tablet 1  . metoprolol succinate (TOPROL-XL) 25 MG 24 hr tablet Take 1 tablet (25 mg total) by mouth daily. 90 tablet 2  . Multiple  Vitamin (MULTIVITAMIN) capsule Take 1 capsule by mouth daily.      . nitroGLYCERIN (NITROSTAT) 0.4 MG SL tablet Place 1 tablet (0.4 mg total) under the tongue every 5 (five) minutes as needed for chest pain. (Patient taking differently: Place 0.4 mg under the tongue every 5 (five) minutes as needed for chest pain (MAX 3 TABLETS). ) 10 tablet 0  . pantoprazole (PROTONIX) 20 MG tablet Take 1 tablet (20 mg total) by mouth daily before breakfast. 30 tablet 11  . predniSONE (DELTASONE) 5 MG tablet Take 2 tablets (10 mg total) by mouth daily with breakfast. 60 tablet 4  . rosuvastatin (CRESTOR) 20 MG tablet Take 20 mg by mouth daily.      Marland Kitchen testosterone cypionate (DEPOTESTOTERONE CYPIONATE) 200 MG/ML injection Inject 50 mg into the muscle once a week. AS DIRECTED    . traMADol (ULTRAM) 50 MG tablet Take 1 tablet (50 mg total) by mouth every 12 (twelve) hours as needed. (Patient taking differently: Take 50 mg by mouth every 12 (twelve) hours as needed (pain). ) 15 tablet 0  . vitamin C (ASCORBIC ACID) 500 MG tablet Take 500 mg by mouth daily.     No current facility-administered medications for this visit.     OBJECTIVE: Middle-aged white man in no acute distress Vitals:   01/12/16 1505  BP: 98/72  Pulse: 66  Resp: 18  Temp: 98.1 F (36.7 C)     Body mass index is 19.63 kg/m.    ECOG FS: 1  Sclerae unicteric, EOMs intact Oropharynx clear and moist No cervical or  supraclavicular adenopathy, no axillary or inguinal adenopathy Lungs no rales or rhonchi Heart regular rate and rhythm Abd soft, nontender, positive bowel sounds MSK no focal spinal tenderness, no upper extremity lymphedema Neuro: nonfocal, well oriented, appropriate affect   LAB RESULTS: Lab Results  Component Value Date   WBC 6.1 01/06/2016   NEUTROABS 3.9 01/06/2016   HGB 13.0 01/06/2016   HCT 38.8 01/06/2016   MCV 92.8 01/06/2016   PLT 211 01/06/2016      Chemistry      Component Value Date/Time   NA 138 01/06/2016 0937   K 4.5 01/06/2016 0937   CL 104 07/14/2014 0540   CL 101 06/30/2012 1325   CO2 29 01/06/2016 0937   BUN 15.5 01/06/2016 0937   CREATININE 1.0 01/06/2016 0937      Component Value Date/Time   CALCIUM 9.5 01/06/2016 0937   ALKPHOS 80 01/06/2016 0937   AST 13 01/06/2016 0937   ALT 9 01/06/2016 0937   BILITOT 0.95 01/06/2016 0937       No results found for: LABCA2  No components found for: [id number]  No results for input(s): INR in the last 168 hours.  Urinalysis No results found for: COLORURINE  STUDIES: No results found.  ASSESSMENT: 70 y.o. 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   PLAN:  I don't see any evidence of disease activity and I don't think any other problems Willie Garcia is experiencing including the weight issue and back pain are related to his diagnosis of lymphoma.  I think it's a good thing he has not lost any weight over the past year. It would be even better if he gained some weight.  We discussed going back on Lexapro which Willie Garcia tells me did help him when he was taking it. Willie Garcia is agreeable.  We have tried a variety of other medications  including low-dose prednisone andMarinol but he took these are very short periods and not consistently. He never agreed to mirtazapine  Willie Garcia does understand that if he loses much more weight there may be significant organ dysfunction. He is agreeable to a goal of picking up another 5 pounds between now and his next visit here, which will be in one year.  They will call with any problems that may develop before then.    Wesleigh Markovic C    01/12/2016

## 2016-01-16 ENCOUNTER — Other Ambulatory Visit: Payer: Self-pay | Admitting: Internal Medicine

## 2016-01-16 DIAGNOSIS — I10 Essential (primary) hypertension: Secondary | ICD-10-CM

## 2016-01-30 ENCOUNTER — Encounter: Payer: Self-pay | Admitting: Internal Medicine

## 2016-01-30 DIAGNOSIS — D1801 Hemangioma of skin and subcutaneous tissue: Secondary | ICD-10-CM | POA: Diagnosis not present

## 2016-01-30 DIAGNOSIS — L570 Actinic keratosis: Secondary | ICD-10-CM | POA: Diagnosis not present

## 2016-01-30 DIAGNOSIS — Z85828 Personal history of other malignant neoplasm of skin: Secondary | ICD-10-CM | POA: Diagnosis not present

## 2016-01-30 DIAGNOSIS — B078 Other viral warts: Secondary | ICD-10-CM | POA: Diagnosis not present

## 2016-02-05 ENCOUNTER — Other Ambulatory Visit: Payer: Self-pay | Admitting: Oncology

## 2016-02-10 ENCOUNTER — Ambulatory Visit (INDEPENDENT_AMBULATORY_CARE_PROVIDER_SITE_OTHER): Payer: Medicare Other | Admitting: Internal Medicine

## 2016-02-10 ENCOUNTER — Encounter: Payer: Self-pay | Admitting: Internal Medicine

## 2016-02-10 VITALS — BP 116/68 | HR 58 | Ht 70.0 in | Wt 137.0 lb

## 2016-02-10 DIAGNOSIS — I2510 Atherosclerotic heart disease of native coronary artery without angina pectoris: Secondary | ICD-10-CM | POA: Diagnosis not present

## 2016-02-10 NOTE — Progress Notes (Signed)
HPI [name] returns for followup. He is a pleasant 70 yo man with a h/o CAD, dyslipidemia, s/p CABG and follicular lympyhoma. In the interim he has been stable.  He denies chest pain or sob. No syncope. He remains active. He has had some weakness.   No Known Allergies   Current Outpatient Prescriptions  Medication Sig Dispense Refill  . acetaminophen (TYLENOL) 325 MG tablet Take 2 tablets (650 mg total) by mouth 3 (three) times daily.    Marland Kitchen aspirin 81 MG tablet Take 1 tablet (81 mg total) by mouth daily.    Marland Kitchen CALCIUM-VITAMIN D PO Take 1 tablet by mouth 2 (two) times daily. (600-200)    . Cholecalciferol (VITAMIN D3) 2000 UNITS TABS Take 2,000 Units by mouth daily.    . Cyanocobalamin (VITAMIN B-12 IJ) Inject 1 mL as directed every 30 (thirty) days.     Marland Kitchen dicyclomine (BENTYL) 20 MG tablet Take 1 tablet (20 mg total) by mouth every 6 (six) hours as needed for spasms. 120 tablet 5  . escitalopram (LEXAPRO) 10 MG tablet Take 1 tablet (10 mg total) by mouth daily. 90 tablet 6  . ezetimibe (ZETIA) 10 MG tablet Take 10 mg by mouth daily.    . fluticasone (FLONASE) 50 MCG/ACT nasal spray Place 2 sprays into both nostrils daily as needed for allergies or rhinitis.     . folic acid (FOLVITE) 1 MG tablet Take 1 mg by mouth daily.      Marland Kitchen gabapentin (NEURONTIN) 300 MG capsule TAKE 1 CAPSULE AT BEDTIME AS NEEDED. 30 capsule 0  . loratadine (CLARITIN) 10 MG tablet Take 10 mg by mouth daily as needed for allergies.    Marland Kitchen metoCLOPramide (REGLAN) 5 MG tablet Take one tablet by mouth three times a day before meals as needed (Patient taking differently: Take one tablet by mouth three times a day before meals as needed stomach issues) 90 tablet 1  . metoprolol succinate (TOPROL-XL) 25 MG 24 hr tablet TAKE 1 TABLET ONCE DAILY. 30 tablet 0  . Multiple Vitamin (MULTIVITAMIN) capsule Take 1 capsule by mouth daily.      . nitroGLYCERIN (NITROSTAT) 0.4 MG SL tablet Place 1 tablet (0.4 mg total) under the tongue every 5  (five) minutes as needed for chest pain. (Patient taking differently: Place 0.4 mg under the tongue every 5 (five) minutes as needed for chest pain (MAX 3 TABLETS). ) 10 tablet 0  . pantoprazole (PROTONIX) 20 MG tablet Take 1 tablet (20 mg total) by mouth daily before breakfast. 30 tablet 11  . rosuvastatin (CRESTOR) 20 MG tablet Take 20 mg by mouth daily.      Marland Kitchen testosterone cypionate (DEPOTESTOTERONE CYPIONATE) 200 MG/ML injection Inject 50 mg into the muscle once a week. AS DIRECTED    . traMADol (ULTRAM) 50 MG tablet Take 1 tablet (50 mg total) by mouth every 12 (twelve) hours as needed. (Patient taking differently: Take 50 mg by mouth every 12 (twelve) hours as needed (pain). ) 15 tablet 0  . vitamin C (ASCORBIC ACID) 500 MG tablet Take 500 mg by mouth daily.     No current facility-administered medications for this visit.      Past Medical History:  Diagnosis Date  . Allergic rhinitis   . Anemia   . Anxiety   . Basal cell carcinoma of skin 2015  . BPH (benign prostatic hyperplasia)   . CAD (coronary artery disease)   . Depression   . GERD (gastroesophageal reflux disease)   .  HTN (hypertension)   . Hyperlipidemia   . Non Hodgkin's lymphoma ([place])   . Osteopenia   . Personal history of colonic adenomas 04/04/2010  . Scoliosis   . 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   Past Surgical History:  Procedure Laterality Date  . CARDIAC CATHETERIZATION  07/14/2014   Procedure: LEFT HEART CATH AND CORS/GRAFTS ANGIOGRAPHY;  Surgeon: Jettie Booze, MD;  Location: [facility] CATH LAB;  Service: Cardiovascular;;  . CARDIAC CATHETERIZATION  07/14/2014   Procedure: INTRAVASCULAR PRESSURE WIRE/FFR STUDY;  Surgeon: Jettie Booze, MD;  Location: [facility] CATH LAB;  Service: Cardiovascular;;  circ  . CERVICAL SPINE SURGERY     C4-C6  . COLONOSCOPY  06/2013   negative  . CORONARY ARTERY BYPASS GRAFT    . HERNIA  REPAIR    . RHINOPLASTY    . UPPER GASTROINTESTINAL ENDOSCOPY       Family History  Problem Relation Age of Onset  . Heart disease Mother   . Heart attack Mother 5  . Heart disease Father   . Heart attack Father 61  . Heart attack Brother 32  . Diabetes Brother   . Aneurysm Paternal Uncle   . Aneurysm Maternal Grandmother   . Heart attack Maternal Grandmother   . Aneurysm Maternal Grandfather   . Heart attack Maternal Grandfather   . Colon cancer Neg Hx   . Stomach cancer Neg Hx   . Esophageal cancer Neg Hx   . Rectal cancer Neg Hx      Social History   Social History  . Marital status: Married    Spouse name: N/A  . Number of children: N/A  . Years of education: N/A   Occupational History  . Retired    Social History Main Topics  . Smoking status: Never Smoker  . Smokeless tobacco: Never Used  . Alcohol use No  . Drug use: No  . Sexual activity: Not on file   Other Topics Concern  . Not on file   Social History Narrative   Married, wife is a Marine scientist, no children   Retired Microbiologist, flies as a hobby   2 caffeinated beverages daily     BP 116/68   Pulse (!) 58   Ht 5\' 10"  (1.778 m)   Wt 137 lb (62.1 kg)   BMI 19.66 kg/m   Physical Exam:  Well appearing 70 yo man,NAD HEENT: Unremarkable Neck:  6 cm JVD, no thyromegally Lungs:  Clear with no wheezes HEART:  Regular rate rhythm, no murmurs, no rubs, no clicks Abd:  soft, positive bowel sounds, no organomegally, no rebound, no guarding Ext:  2 plus pulses, no edema, no cyanosis, no clubbing Skin:  No rashes no nodules Neuro:  CN II through XII intact, motor grossly intact  EKG - nsr   Assess/Plan: 1. CAD/Angina - he has no symptoms almost 18 years out from CABG 2. Dyslipidemia - he will continue his statin therapy 3. Fatigue - he is tired at times but he is working in his garage, maintaining his equipment.  Willie Garcia.D.

## 2016-02-10 NOTE — Patient Instructions (Signed)
Medication Instructions:  Your physician recommends that you continue on your current medications as directed. Please refer to the Current Medication list given to you today.   Labwork: None Ordered   Testing/Procedures: None Ordered   Follow-Up: Your physician wants you to follow-up in: 1 year with Dr. Taylor.  You will receive a reminder letter in the mail two months in advance. If you don't receive a letter, please call our office to schedule the follow-up appointment.   If you need a refill on your cardiac medications before your next appointment, please call your pharmacy.   Thank you for choosing [facility]! Billiejean Schimek, RN [phone]    

## 2016-02-14 ENCOUNTER — Telehealth: Payer: Self-pay | Admitting: Oncology

## 2016-02-14 NOTE — Telephone Encounter (Signed)
lvm to inform pt of 8/31 lab appt per LOS

## 2016-02-16 ENCOUNTER — Other Ambulatory Visit (HOSPITAL_BASED_OUTPATIENT_CLINIC_OR_DEPARTMENT_OTHER): Payer: Medicare Other

## 2016-02-16 DIAGNOSIS — C8200 Follicular lymphoma grade I, unspecified site: Secondary | ICD-10-CM | POA: Diagnosis not present

## 2016-02-16 LAB — CBC WITH DIFFERENTIAL/PLATELET
BASO%: 1.2 % (ref 0.0–2.0)
Basophils Absolute: 0.1 10*3/uL (ref 0.0–0.1)
EOS%: 2.1 % (ref 0.0–7.0)
Eosinophils Absolute: 0.1 10*3/uL (ref 0.0–0.5)
HCT: 39.8 % (ref 38.4–49.9)
HGB: 13.4 g/dL (ref 13.0–17.1)
LYMPH%: 21.6 % (ref 14.0–49.0)
MCH: 31.1 pg (ref 27.2–33.4)
MCHC: 33.6 g/dL (ref 32.0–36.0)
MCV: 92.5 fL (ref 79.3–98.0)
MONO#: 0.5 10*3/uL (ref 0.1–0.9)
MONO%: 8.8 % (ref 0.0–14.0)
NEUT#: 3.6 10*3/uL (ref 1.5–6.5)
NEUT%: 66.3 % (ref 39.0–75.0)
Platelets: 227 10*3/uL (ref 140–400)
RBC: 4.3 10*6/uL (ref 4.20–5.82)
RDW: 13.3 % (ref 11.0–14.6)
WBC: 5.5 10*3/uL (ref 4.0–10.3)
lymph#: 1.2 10*3/uL (ref 0.9–3.3)

## 2016-02-16 LAB — COMPREHENSIVE METABOLIC PANEL
ALT: 9 U/L (ref 0–55)
AST: 14 U/L (ref 5–34)
Albumin: 4 g/dL (ref 3.5–5.0)
Alkaline Phosphatase: 83 U/L (ref 40–150)
Anion Gap: 9 mEq/L (ref 3–11)
BUN: 12.6 mg/dL (ref 7.0–26.0)
CO2: 29 mEq/L (ref 22–29)
Calcium: 9.4 mg/dL (ref 8.4–10.4)
Chloride: 102 mEq/L (ref 98–109)
Creatinine: 1 mg/dL (ref 0.7–1.3)
EGFR: 76 mL/min/{1.73_m2} — ABNORMAL LOW (ref >90)
Glucose: 79 mg/dl (ref 70–140)
Potassium: 4 mEq/L (ref 3.5–5.1)
Sodium: 140 mEq/L (ref 136–145)
Total Bilirubin: 1.01 mg/dL (ref 0.20–1.20)
Total Protein: 7.1 g/dL (ref 6.4–8.3)

## 2016-02-16 LAB — LACTATE DEHYDROGENASE: LDH: 158 U/L (ref 125–245)

## 2016-02-17 LAB — BETA 2 MICROGLOBULIN, SERUM: Beta-2: 1.6 mg/L (ref 0.6–2.4)

## 2016-02-29 ENCOUNTER — Telehealth: Payer: Self-pay

## 2016-02-29 NOTE — Telephone Encounter (Signed)
Wife called for beta 2 test results. Results placed on Dr Magrinat's desk for review.

## 2016-03-22 DIAGNOSIS — F4321 Adjustment disorder with depressed mood: Secondary | ICD-10-CM | POA: Diagnosis not present

## 2016-03-22 DIAGNOSIS — C8590 Non-Hodgkin lymphoma, unspecified, unspecified site: Secondary | ICD-10-CM | POA: Diagnosis not present

## 2016-03-22 DIAGNOSIS — M79604 Pain in right leg: Secondary | ICD-10-CM | POA: Diagnosis not present

## 2016-03-22 DIAGNOSIS — Z23 Encounter for immunization: Secondary | ICD-10-CM | POA: Diagnosis not present

## 2016-03-22 DIAGNOSIS — I10 Essential (primary) hypertension: Secondary | ICD-10-CM | POA: Diagnosis not present

## 2016-03-22 DIAGNOSIS — D510 Vitamin B12 deficiency anemia due to intrinsic factor deficiency: Secondary | ICD-10-CM | POA: Diagnosis not present

## 2016-03-22 DIAGNOSIS — I2510 Atherosclerotic heart disease of native coronary artery without angina pectoris: Secondary | ICD-10-CM | POA: Diagnosis not present

## 2016-03-22 DIAGNOSIS — E291 Testicular hypofunction: Secondary | ICD-10-CM | POA: Diagnosis not present

## 2016-03-22 DIAGNOSIS — M5136 Other intervertebral disc degeneration, lumbar region: Secondary | ICD-10-CM | POA: Diagnosis not present

## 2016-03-22 DIAGNOSIS — E784 Other hyperlipidemia: Secondary | ICD-10-CM | POA: Diagnosis not present

## 2016-03-22 DIAGNOSIS — M8580 Other specified disorders of bone density and structure, unspecified site: Secondary | ICD-10-CM | POA: Diagnosis not present

## 2016-05-09 DIAGNOSIS — M4120 Other idiopathic scoliosis, site unspecified: Secondary | ICD-10-CM | POA: Diagnosis not present

## 2016-05-16 DIAGNOSIS — D510 Vitamin B12 deficiency anemia due to intrinsic factor deficiency: Secondary | ICD-10-CM | POA: Diagnosis not present

## 2016-05-16 DIAGNOSIS — D6489 Other specified anemias: Secondary | ICD-10-CM | POA: Diagnosis not present

## 2016-05-16 DIAGNOSIS — M859 Disorder of bone density and structure, unspecified: Secondary | ICD-10-CM | POA: Diagnosis not present

## 2016-05-16 DIAGNOSIS — I10 Essential (primary) hypertension: Secondary | ICD-10-CM | POA: Diagnosis not present

## 2016-05-16 DIAGNOSIS — Z0000 Encounter for general adult medical examination without abnormal findings: Secondary | ICD-10-CM | POA: Diagnosis not present

## 2016-05-31 ENCOUNTER — Other Ambulatory Visit: Payer: Self-pay | Admitting: Internal Medicine

## 2016-05-31 NOTE — Telephone Encounter (Signed)
May I refill dicyclomine Sir, thank you.

## 2016-05-31 NOTE — Telephone Encounter (Signed)
RF x 1 year

## 2016-06-28 ENCOUNTER — Other Ambulatory Visit: Payer: Self-pay | Admitting: Internal Medicine

## 2016-06-28 NOTE — Telephone Encounter (Signed)
May I refill Sir, thank you. 

## 2016-06-29 NOTE — Telephone Encounter (Signed)
Refill x 3 

## 2016-07-07 ENCOUNTER — Other Ambulatory Visit: Payer: Self-pay | Admitting: Internal Medicine

## 2016-07-07 DIAGNOSIS — I10 Essential (primary) hypertension: Secondary | ICD-10-CM

## 2016-07-13 IMAGING — CR DG CHEST 1V PORT
1 series · 1 of 1 positions shown · non-contrast
Comparison: CT of the chest performed 05/15/2010

CLINICAL DATA: Acute onset of lower chest and upper abdominal pain.
Initial encounter.

EXAM:
PORTABLE CHEST - 1 VIEW

[AP]
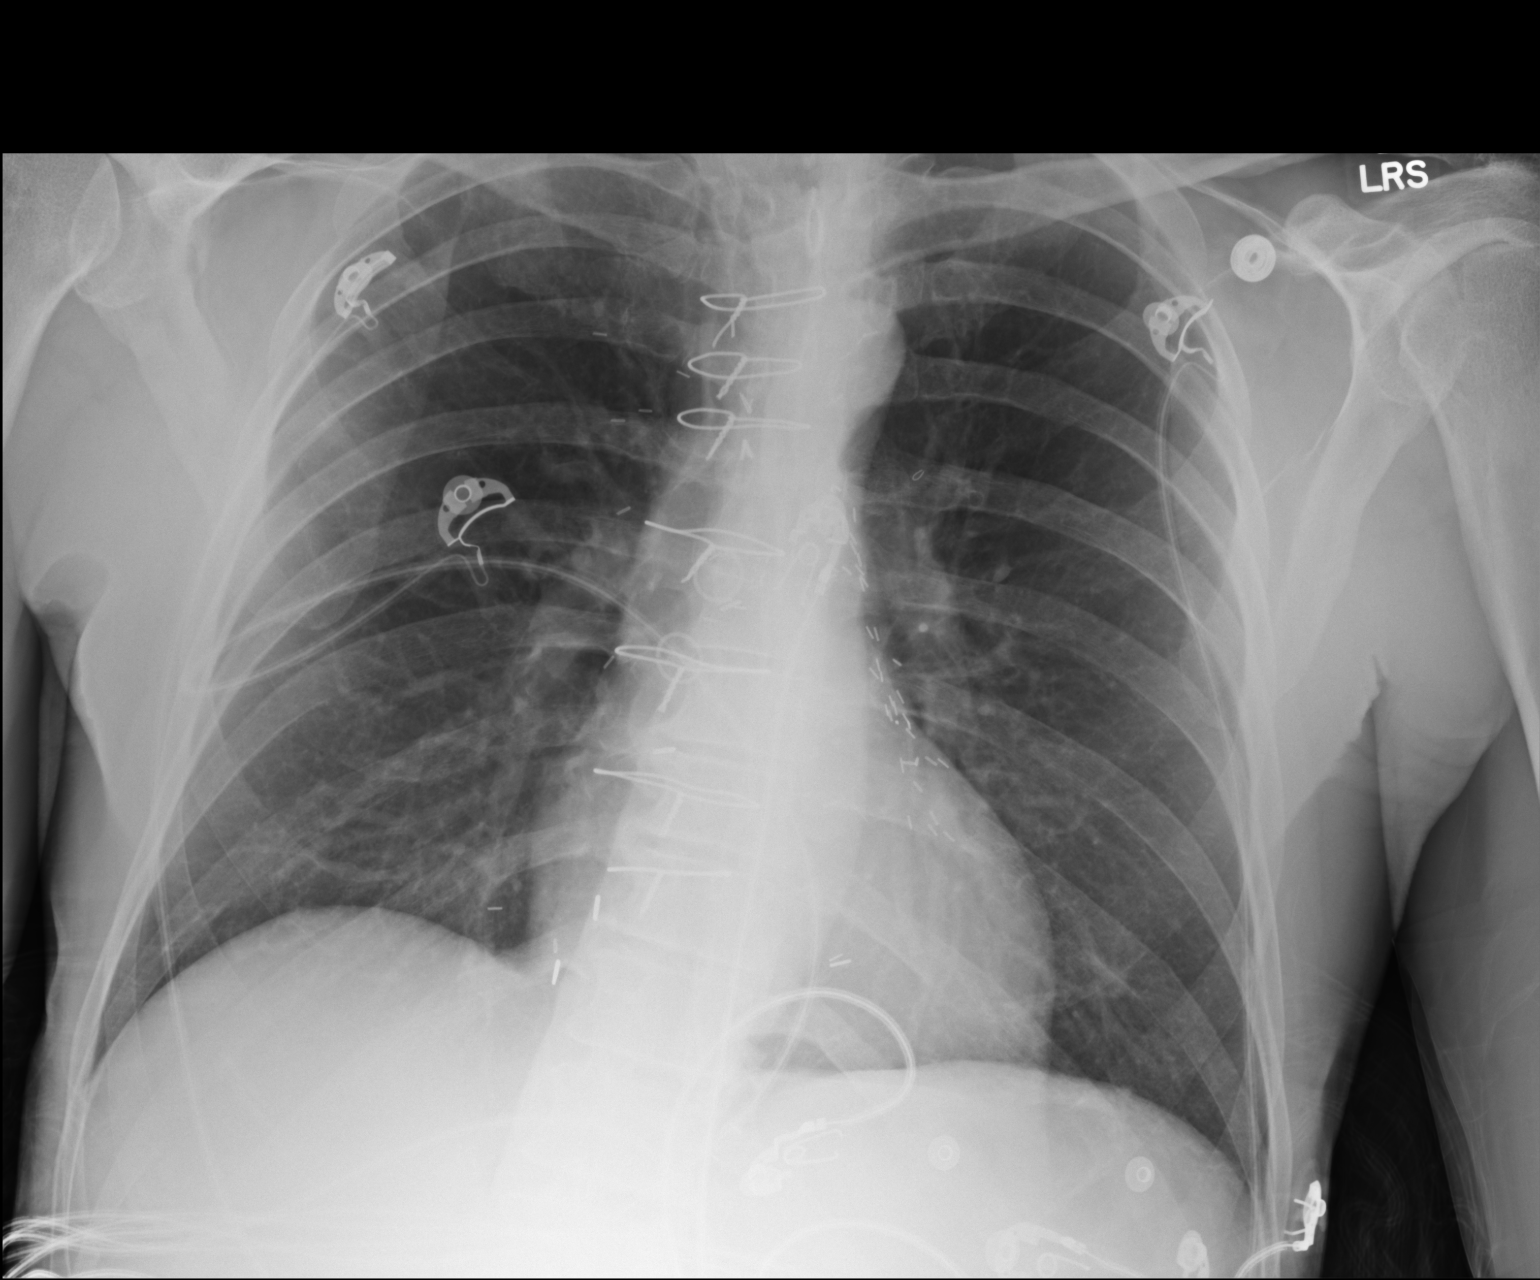

[1 of 1 positions shown; findings below may reference images not displayed]

FINDINGS: The lungs are well-aerated and clear. There is no evidence of focal
opacification, pleural effusion or pneumothorax.

The cardiomediastinal silhouette is normal in size. The patient is
status post median sternotomy, with evidence of prior CABG. No acute
osseous abnormalities are seen.
IMPRESSION: No acute cardiopulmonary process seen.

## 2016-08-23 DIAGNOSIS — E291 Testicular hypofunction: Secondary | ICD-10-CM | POA: Diagnosis not present

## 2016-08-30 DIAGNOSIS — N401 Enlarged prostate with lower urinary tract symptoms: Secondary | ICD-10-CM | POA: Diagnosis not present

## 2016-08-30 DIAGNOSIS — R3915 Urgency of urination: Secondary | ICD-10-CM | POA: Diagnosis not present

## 2016-08-30 DIAGNOSIS — E291 Testicular hypofunction: Secondary | ICD-10-CM | POA: Diagnosis not present

## 2016-09-11 ENCOUNTER — Ambulatory Visit (INDEPENDENT_AMBULATORY_CARE_PROVIDER_SITE_OTHER): Payer: BLUE CROSS/BLUE SHIELD | Admitting: Orthopaedic Surgery

## 2016-09-12 ENCOUNTER — Ambulatory Visit (INDEPENDENT_AMBULATORY_CARE_PROVIDER_SITE_OTHER): Payer: Medicare Other | Admitting: Orthopedic Surgery

## 2016-09-12 ENCOUNTER — Encounter (INDEPENDENT_AMBULATORY_CARE_PROVIDER_SITE_OTHER): Payer: Self-pay | Admitting: Orthopedic Surgery

## 2016-09-12 ENCOUNTER — Ambulatory Visit (INDEPENDENT_AMBULATORY_CARE_PROVIDER_SITE_OTHER): Payer: Medicare Other

## 2016-09-12 VITALS — BP 93/47 | HR 78 | Resp 14 | Ht 70.0 in | Wt 145.0 lb

## 2016-09-12 DIAGNOSIS — M79645 Pain in left finger(s): Secondary | ICD-10-CM | POA: Diagnosis not present

## 2016-09-12 NOTE — Progress Notes (Signed)
Office Visit Note   Patient: Willie Garcia           Date of Birth: Sep 25, 1945           MRN: [id number] Visit Date: 09/12/2016              Requested by: Prince Solian, MD [address] [place], [place] [address] PCP: Tivis Ringer, MD   Assessment & Plan: Visit Diagnoses:  1. Thumb pain, left     Plan:  #1: Given samples of [place] #2: If this does not continue to improve then he'll return and we may need to taken to the operating room and opened formally.  Follow-Up Instructions: Return if symptoms worsen or fail to improve.   Orders:  Orders Placed This Encounter  Procedures  . XR Finger Thumb Left   No orders of the defined types were placed in this encounter.     Procedures: No procedures performed   Clinical Data: No additional findings.   Subjective: Chief Complaint  Patient presents with  . Left Thumb - Pain    Left thumb pain x 10 days, splinter from piece of wood, had infection, white spot on thumb, tender, limited range of motion, swelling, some redness, warm to touch, did not seek medical care, not diabetic, no history of surgery.  Lebert apparently had taken using a magnifying lens and lap and used a needle and removed what he felt was the splinter of wood. He does have some tenderness over the IP joint dorsally. He does have motion of the IP joint without the marked discomfort. Just minimal to mild pain at best.    Review of Systems  Constitutional: Negative.   HENT: Negative.   Respiratory: Negative.   Cardiovascular: Negative.        History of quadruple bypass  Gastrointestinal: Negative.   Genitourinary: Negative.   Skin: Negative.   Neurological: Negative.   Hematological: Negative.        History of non-Hodgkin's lymphoma  Psychiatric/Behavioral: Negative.      Objective: Vital Signs: BP (!) 93/47 (BP Location: Right Arm, Patient Position: Sitting, Cuff Size: Normal)   Pulse 78   Resp 14   Ht 5\' 10"  (1.778 m)   Wt  145 lb (65.8 kg)   BMI 20.81 kg/m   Physical Exam  Constitutional: He is oriented to person, place, and time. He appears well-developed and well-nourished.  HENT:  Head: Normocephalic and atraumatic.  Eyes: EOM are normal. Pupils are equal, round, and reactive to light.  Pulmonary/Chest: Effort normal.  Neurological: He is alert and oriented to person, place, and time.  Skin: Skin is warm and dry.  Psychiatric: He has a normal mood and affect. His behavior is normal. Judgment and thought content normal.    Ortho Exam   Does not have any erythema or warmth at the IP joint. Eyes minimal if any discomfort over the IP joint. Flexor and extensors intact. I do not feel any foreign body at this time in the dorsum where he had done this out with a needle. Smooth motion noted.  Specialty Comments:  No specialty comments available.  Imaging: Xr Finger Thumb Left  Result Date: 09/12/2016 Three-view x-ray of the left thumb reveals some degenerative changes at the IP joint with some spurring. Narrowing of the space. Do not see any foreign body radiographically.    PMFS History: Patient Active Problem List   Diagnosis Date Noted  . Chest pain at rest   . Abnormal  nuclear stress test 07/14/2014  . Chest pain with high risk for cardiac etiology - Atypical Pain, but abnormal Myoview. 07/13/2014  . Hyperlipidemia 07/13/2014  . Iron deficiency anemia, unspecified 05/19/2013  . B12 deficiency 05/19/2013  . Dyspepsia 05/19/2013  . Scoliosis   . BPH (benign prostatic hyperplasia)   . Lymphoma, follicular ([place]) 79/72/8206  . Abdominal bruit 10/09/2010  . Personal history of colonic adenomas 04/04/2010  . Coronary atherosclerosis 01/18/2009  . DYSLIPIDEMIA 10/02/2008  . Essential hypertension 10/02/2008   Past Medical History:  Diagnosis Date  . Allergic rhinitis   . Anemia   . Anxiety   . Basal cell carcinoma of skin 2015  . BPH (benign prostatic hyperplasia)   . CAD (coronary artery  disease)   . Depression   . GERD (gastroesophageal reflux disease)   . HTN (hypertension)   . Hyperlipidemia   . Non Hodgkin's lymphoma ([place])   . Osteopenia   . Personal history of colonic adenomas 04/04/2010  . Scoliosis   . 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 Heart disease Mother   . Heart attack Mother 24  . Heart disease Father   . Heart attack Father 45  . Heart attack Brother 66  . Diabetes Brother   . Aneurysm Paternal Uncle   . Aneurysm Maternal Grandmother   . Heart attack Maternal Grandmother   . Aneurysm Maternal Grandfather   . Heart attack Maternal Grandfather   . Colon cancer Neg Hx   . Stomach cancer Neg Hx   . Esophageal cancer Neg Hx   . Rectal cancer Neg Hx     Past Surgical History:  Procedure Laterality Date  . CARDIAC CATHETERIZATION  07/14/2014   Procedure: LEFT HEART CATH AND CORS/GRAFTS ANGIOGRAPHY;  Surgeon: Jettie Booze, MD;  Location: [facility] CATH LAB;  Service: Cardiovascular;;  . CARDIAC CATHETERIZATION  07/14/2014   Procedure: INTRAVASCULAR PRESSURE WIRE/FFR STUDY;  Surgeon: Jettie Booze, MD;  Location: [facility] CATH LAB;  Service: Cardiovascular;;  circ  . CERVICAL SPINE SURGERY     C4-C6  . COLONOSCOPY  06/2013   negative  . CORONARY ARTERY BYPASS GRAFT    . HERNIA REPAIR    . RHINOPLASTY    . UPPER GASTROINTESTINAL ENDOSCOPY     Social History   Occupational History  . Retired    Social History Main Topics  . Smoking status: Never Smoker  . Smokeless tobacco: Never Used  . Alcohol use No  . Drug use: No  . Sexual activity: Not on file

## 2016-10-02 ENCOUNTER — Ambulatory Visit (INDEPENDENT_AMBULATORY_CARE_PROVIDER_SITE_OTHER): Payer: Medicare Other | Admitting: Orthopaedic Surgery

## 2016-10-02 ENCOUNTER — Encounter (INDEPENDENT_AMBULATORY_CARE_PROVIDER_SITE_OTHER): Payer: Self-pay | Admitting: Orthopaedic Surgery

## 2016-10-02 VITALS — BP 138/65 | HR 70 | Ht 70.0 in | Wt 145.0 lb

## 2016-10-02 DIAGNOSIS — M1812 Unilateral primary osteoarthritis of first carpometacarpal joint, left hand: Secondary | ICD-10-CM

## 2016-10-02 MED ORDER — BUPIVACAINE HCL 0.5 % IJ SOLN
0.3300 mL | INTRAMUSCULAR | Status: AC | PRN
  Administered 2016-10-02: .33 mL via INTRA_ARTICULAR

## 2016-10-02 MED ORDER — LIDOCAINE HCL 1 % IJ SOLN
0.3000 mL | INTRAMUSCULAR | Status: AC | PRN
  Administered 2016-10-02: .3 mL

## 2016-10-02 MED ORDER — METHYLPREDNISOLONE ACETATE 40 MG/ML IJ SUSP
13.3300 mg | INTRAMUSCULAR | Status: AC | PRN
  Administered 2016-10-02: 13.33 mg via INTRA_ARTICULAR

## 2016-10-02 NOTE — Progress Notes (Signed)
Office Visit Note   Patient: Willie Garcia           Date of Birth: [birth year]           MRN: [id number] Visit Date: 10/02/2016              Requested by: Prince Solian, MD [address] [place], [place] [address] PCP: Tivis Ringer, MD   Assessment & Plan: Visit Diagnoses:  1. Osteoarthritis of thumb, left     Plan:  #1: Corticosteroid injection was given by Dr. Durward Fortes to the IP joint of the left thumb. Tolerated procedure well #2: Follow back up if he is having problems we can possibly refer him over to the hand surgeons.  Follow-Up Instructions: No Follow-up on file.   Orders:  No orders of the defined types were placed in this encounter.  No orders of the defined types were placed in this encounter.     Procedures: Small Joint Inj Date/Time: 10/02/2016 5:11 PM Performed by: Biagio Borg D Authorized by: Biagio Borg D   Consent Given by:  Patient Timeout: prior to procedure the correct patient, procedure, and site was verified   Indications:  Pain Location:  Thumb Site:  L thumb IP Prep: patient was prepped and draped in usual sterile fashion   Needle Size:  27 G Spinal Needle: No   Approach:  Medial Ultrasound Guided: No   Fluoroscopic Guidance: No   Medications:  0.3 mL lidocaine 1 %; 0.33 mL bupivacaine 0.5 %; 13.33 mg methylPREDNISolone acetate 40 MG/ML Aspiration Attempted: No       Clinical Data: No additional findings.   Subjective: Chief Complaint  Patient presents with  . Left Thumb - Pain, Follow-up    Willie Garcia is a 71 year old male that presents with a follow up for his L thumb. He relates he is in a lot pain, redness and swollen. No meds    Willie Garcia is a very pleasant 71 year old white male who is seen today for evaluation of his left thumb. I saw him about 3 weeks ago which was about 10 days after he had a splinter from piece of wood, which apparently had come infected with white spot on thumb with tenderness and limited  range of motion and swelling. He had limited range of motion, swelling, some redness, warm to touch but did not seek medical care.   Willie Garcia apparently then had  using a magnifying lens and lamp and used a needle and removed what he felt was the splinter of wood. He does have some tenderness over the IP joint dorsally. He does have motion of the IP joint without the marked discomfort. Just minimal to mild pain at best.  He was treated conservatively and had given him some diclofenac gel to use on the finger but it did not have any benefit. He returns today with pain in the IP joint of the left thumb. A swollen or redness he claims. In today for reevaluation.    Review of Systems  Constitutional: Negative.   HENT: Negative.   Respiratory: Negative.   Cardiovascular: Negative.        History of quadruple bypass  Gastrointestinal: Negative.   Genitourinary: Negative.   Skin: Negative.   Neurological: Negative.   Hematological: Negative.        History of non-Hodgkin's lymphoma  Psychiatric/Behavioral: Negative.      Objective: Vital Signs: BP 138/65   Pulse 70   Ht 5\' 10"  (1.778 m)  Wt 145 lb (65.8 kg)   BMI 20.81 kg/m   Physical Exam  Constitutional: He is oriented to person, place, and time. He appears well-developed and well-nourished.  HENT:  Head: Normocephalic and atraumatic.  Eyes: EOM are normal. Pupils are equal, round, and reactive to light.  Pulmonary/Chest: Effort normal.  Neurological: He is alert and oriented to person, place, and time.  Skin: Skin is warm and dry.  Psychiatric: He has a normal mood and affect. His behavior is normal. Judgment and thought content normal.    Ortho Exam  The left thumb does not reveal any warmth or erythema. He is tender to palpation over the IP joint of the thumb.  Specialty Comments:  No specialty comments available.  Imaging: No results found.   PMFS History: Patient Active Problem List   Diagnosis Date Noted  .  Chest pain at rest   . Abnormal nuclear stress test 07/14/2014  . Chest pain with high risk for cardiac etiology - Atypical Pain, but abnormal Myoview. 07/13/2014  . Hyperlipidemia 07/13/2014  . Iron deficiency anemia, unspecified 05/19/2013  . B12 deficiency 05/19/2013  . Dyspepsia 05/19/2013  . Scoliosis   . BPH (benign prostatic hyperplasia)   . 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 Abdominal bruit 10/09/2010  . Personal history of colonic adenomas 04/04/2010  . Coronary atherosclerosis 01/18/2009  . DYSLIPIDEMIA 10/02/2008  . Essential hypertension 10/02/2008   Past Medical History:  Diagnosis Date  . Allergic rhinitis   . Anemia   . Anxiety   . Basal cell carcinoma of skin 2015  . BPH (benign prostatic hyperplasia)   . CAD (coronary artery disease)   . Depression   . GERD (gastroesophageal reflux disease)   . HTN (hypertension)   . Hyperlipidemia   . Non Hodgkin's lymphoma ([place])   . Osteopenia   . Personal history of colonic adenomas 04/04/2010  . Scoliosis   . 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 Heart disease Mother   . Heart attack Mother 70  . Heart disease Father   . Heart attack Father 24  . Heart attack Brother 77  . Diabetes Brother   . Aneurysm Paternal Uncle   . Aneurysm Maternal Grandmother   . Heart attack Maternal Grandmother   . Aneurysm Maternal Grandfather   . Heart attack Maternal Grandfather   . Colon cancer Neg Hx   . Stomach cancer Neg Hx   . Esophageal cancer Neg Hx   . Rectal cancer Neg Hx     Past Surgical History:  Procedure Laterality Date  . CARDIAC CATHETERIZATION  07/14/2014   Procedure: LEFT HEART CATH AND CORS/GRAFTS ANGIOGRAPHY;  Surgeon: Jettie Booze, MD;  Location: [facility] C[facility] LAB;  Service: Cardiovascular;;  . CARDIAC CATHETERIZATION  07/14/2014   Procedure: INTRAVASCULAR PRESSURE WIRE/FFR STUDY;  Surgeon: Jettie Booze, MD;  Location: [facility] CATH LAB;   Service: Cardiovascular;;  circ  . CERVICAL SPINE SURGERY     C4-C6  . COLONOSCOPY  06/2013   negative  . CORONARY ARTERY BYPASS GRAFT    . HERNIA REPAIR    . RHINOPLASTY    . UPPER GASTROINTESTINAL ENDOSCOPY     Social History   Occupational History  . Retired    Social History Main Topics  . Smoking status: Never Smoker  . Smokeless tobacco: Never Used  . Alcohol use No  . Drug use: No  . Sexual  activity: Not on file

## 2016-10-22 ENCOUNTER — Ambulatory Visit (INDEPENDENT_AMBULATORY_CARE_PROVIDER_SITE_OTHER): Payer: Medicare Other | Admitting: Podiatry

## 2016-10-22 ENCOUNTER — Encounter: Payer: Self-pay | Admitting: Podiatry

## 2016-10-22 VITALS — BP 111/64 | HR 64 | Resp 16 | Ht 70.0 in | Wt 140.0 lb

## 2016-10-22 DIAGNOSIS — L600 Ingrowing nail: Secondary | ICD-10-CM | POA: Diagnosis not present

## 2016-10-22 NOTE — Progress Notes (Signed)
   Subjective:    Patient ID: Willie Garcia, male    DOB: 04-21-1946, 71 y.o.   MRN: [id number]  HPI Chief Complaint  Patient presents with  . Nail Problem    Left foot; great toe-medial side; pt stated, "Dr. Durward Fortes dug out nail 5 months ago; soaked in epsom salt with relief"      Review of Systems  Musculoskeletal: Positive for back pain.  All other systems reviewed and are negative.      Objective:   Physical Exam        Assessment & Plan:

## 2016-10-22 NOTE — Patient Instructions (Signed)

## 2016-10-24 NOTE — Progress Notes (Signed)
Subjective:    Patient ID: Willie Garcia, male   DOB: 71 y.o.   MRN: [id number]   HPI patient presents stating he's had a lot of problems with his left big toenail with pain and states she's tried to get out and was treated by Dr. Gerhard Perches but it has come back    Review of Systems  All other systems reviewed and are negative.       Objective:  Physical Exam  Cardiovascular: Intact distal pulses.   Musculoskeletal: Normal range of motion.  Neurological: He is alert.  Skin: Skin is warm.  Nursing note and vitals reviewed.  neurovascular status intact muscle strength was adequate range of motion within normal limits with patient found have incurvated left hallux medial side with slight central irritation of the nailbed     Assessment:   Reoccurrence spicule left hallux nail      Plan:    Reviewed condition discussed at great length and I do think that surgical removal of the nail corner would be best for this patient. He wants it done and I reviewed the procedure and risk and today infiltrated left hallux 60 Milligan times like Marcaine mixture removed the border exposed matrix and applied phenol 3 applications 30 seconds followed by alcohol lavaged sterile dressing. Gave instructions on soaks and reappoint

## 2016-10-25 ENCOUNTER — Telehealth: Payer: Self-pay | Admitting: Oncology

## 2016-10-25 NOTE — Telephone Encounter (Signed)
Pt wife called to move MD appt to Aug due to her wanting to come and unable to get off work 7/26

## 2016-10-29 ENCOUNTER — Ambulatory Visit: Payer: Medicare Other | Admitting: Podiatry

## 2016-11-01 ENCOUNTER — Ambulatory Visit (INDEPENDENT_AMBULATORY_CARE_PROVIDER_SITE_OTHER): Payer: Self-pay | Admitting: Podiatry

## 2016-11-01 ENCOUNTER — Encounter: Payer: Self-pay | Admitting: Podiatry

## 2016-11-01 DIAGNOSIS — L03031 Cellulitis of right toe: Secondary | ICD-10-CM

## 2016-11-01 MED ORDER — CEPHALEXIN 500 MG PO CAPS: 500.0000 mg | ORAL_CAPSULE | Freq: Two times a day (BID) | ORAL | 1 refills | Status: DC

## 2016-11-05 NOTE — Progress Notes (Signed)
Subjective:    Patient ID: Willie Garcia, male   DOB: 71 y.o.   MRN: [id number]   HPI patient states that he has had both his big toenails removed in the right one is red and he's concerned about infection    ROS      Objective:  Physical Exam Neurovascular status intact negative Homans sign noted with patient's right hallux showing good healing with crusted tissue but there is redness in the proximal portion    Assessment:    Localized paronychia infection right hallux     Plan:    Reviewed anti-inflammatories physical therapy for therapy as precautionary measure placed on cephalexin 500 mg 3 times a day with instructions on coming back immediately if any further redness or any other changes were to occur

## 2016-11-09 ENCOUNTER — Ambulatory Visit (INDEPENDENT_AMBULATORY_CARE_PROVIDER_SITE_OTHER): Payer: Medicare Other

## 2016-11-09 ENCOUNTER — Encounter (INDEPENDENT_AMBULATORY_CARE_PROVIDER_SITE_OTHER): Payer: Self-pay | Admitting: Orthopaedic Surgery

## 2016-11-09 ENCOUNTER — Ambulatory Visit (INDEPENDENT_AMBULATORY_CARE_PROVIDER_SITE_OTHER): Payer: Medicare Other | Admitting: Orthopaedic Surgery

## 2016-11-09 ENCOUNTER — Other Ambulatory Visit (INDEPENDENT_AMBULATORY_CARE_PROVIDER_SITE_OTHER): Payer: Self-pay

## 2016-11-09 VITALS — BP 95/62 | HR 60 | Ht 70.0 in | Wt 141.0 lb

## 2016-11-09 DIAGNOSIS — M79674 Pain in right toe(s): Secondary | ICD-10-CM

## 2016-11-09 DIAGNOSIS — M79671 Pain in right foot: Secondary | ICD-10-CM

## 2016-11-09 DIAGNOSIS — L600 Ingrowing nail: Secondary | ICD-10-CM | POA: Diagnosis not present

## 2016-11-09 NOTE — Progress Notes (Signed)
Office Visit Note   Patient: Willie Garcia           Date of Birth: 1946-04-06           MRN: [id number] Visit Date: 11/09/2016              Requested by: Prince Solian, MD [address] [place], [place] [address] PCP: Prince Solian, MD   Assessment & Plan: Visit Diagnoses:  1. Great toe pain, right   2. Right foot pain   3. Ingrown nail of great toe of right foot   4. Ingrown nail of great toe of left foot     Plan: Finish course of Keflex. No probe percent to right great toenail daily. Continue soaking both great toes in Epson salts daily. Office 10-14 days if no improvement once Keflex has been discontinued. Culture drainage from right great toenail  Follow-Up Instructions: Return in about 1 week (around 11/16/2016), or if no improvement.   Orders:  Orders Placed This Encounter  Procedures  . Wound culture  . XR Foot 2 Views Right   No orders of the defined types were placed in this encounter.     Procedures: No procedures performed   Clinical Data: No additional findings.   Subjective: No chief complaint on file. Willie Garcia has been recently followed by the podiatrist for recurrent paronychias of both great toenails. He's had partial excision of the nail and follow-up Keflex treatment. He's concerned that he still having some discomfort in his right great toenail. He also notes that the left great toenail is "much better". He's presently soaking his foot in Epsom salts on a daily basis and finishing a 10 day course of Keflex. Pain is less. Still having some drainage from the right great toenail. Consider change of antibiotics and further nail resection if infection on the right does not resolve  HPI  Review of Systems   Objective: Vital Signs: BP 95/62   Pulse 60   Ht 5\' 10"  (1.778 m)   Wt 141 lb (64 kg)   BMI 20.23 kg/m   Physical Exam  Ortho Exam paronychia of left great toenail appears to be resolving. No drainage or tenderness no evidence of  an ascending lymphangitis. Still some residual drainage along the medial aspect and base of the right great toenail. This will be cultured. Mild redness and tenderness. Sensibility intact.  Specialty Comments:  No specialty comments available.  Imaging: Xr Foot 2 Views Right  Result Date: 11/09/2016 Films of the right foot obtained in 2 projections specifically evaluating the right great toe. There clinically is a resolving paronychia of the great toenail. I don't see any underlying osteomyelitis or arthritic changes at either the IP joint or the metatarsal phalangeal joint of the great toe.    PMFS History: Patient Active Problem List   Diagnosis Date Noted  . Chest pain at rest   . Abnormal nuclear stress test 07/14/2014  . Chest pain with high risk for cardiac etiology - Atypical Pain, but abnormal Myoview. 07/13/2014  . Hyperlipidemia 07/13/2014  . Iron deficiency anemia, unspecified 05/19/2013  . B12 deficiency 05/19/2013  . Dyspepsia 05/19/2013  . Scoliosis   . BPH (benign prostatic hyperplasia)   . 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 Abdominal bruit 10/09/2010  . Personal history of colonic adenomas 04/04/2010  . Coronary atherosclerosis 01/18/2009  . DYSLIPIDEMIA 10/02/2008  . Essential hypertension 10/02/2008   Past Medical History:  Diagnosis Date  . Allergic rhinitis   .  Anemia   . Anxiety   . Basal cell carcinoma of skin 2015  . BPH (benign prostatic hyperplasia)   . CAD (coronary artery disease)   . Depression   . GERD (gastroesophageal reflux disease)   . HTN (hypertension)   . Hyperlipidemia   . Non Hodgkin's lymphoma ([place])   . Osteopenia   . Personal history of colonic adenomas 04/04/2010  . Scoliosis   . 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 Heart disease Mother   . Heart attack Mother 55  . Heart disease Father   . Heart attack Father 74  . Heart attack Brother 46  . Diabetes  Brother   . Aneurysm Paternal Uncle   . Aneurysm Maternal Grandmother   . Heart attack Maternal Grandmother   . Aneurysm Maternal Grandfather   . Heart attack Maternal Grandfather   . Colon cancer Neg Hx   . Stomach cancer Neg Hx   . Esophageal cancer Neg Hx   . Rectal cancer Neg Hx     Past Surgical History:  Procedure Laterality Date  . CARDIAC CATHETERIZATION  07/14/2014   Procedure: LEFT HEART CATH AND CORS/GRAFTS ANGIOGRAPHY;  Surgeon: Jettie Booze, MD;  Location: [facility] CATH LAB;  Service: Cardiovascular;;  . CARDIAC CATHETERIZATION  07/14/2014   Procedure: INTRAVASCULAR PRESSURE WIRE/FFR STUDY;  Surgeon: Jettie Booze, MD;  Location: [facility] CATH LAB;  Service: Cardiovascular;;  circ  . CERVICAL SPINE SURGERY     C4-C6  . COLONOSCOPY  06/2013   negative  . CORONARY ARTERY BYPASS GRAFT    . HERNIA REPAIR    . RHINOPLASTY    . UPPER GASTROINTESTINAL ENDOSCOPY     Social History   Occupational History  . Retired    Social History Main Topics  . Smoking status: Never Smoker  . Smokeless tobacco: Never Used  . Alcohol use No  . Drug use: No  . Sexual activity: Not on file     Garald Balding, MD   Note - This record has been created using [employer].  Chart creation errors have been sought, but may not always  have been located. Such creation errors do not reflect on  the standard of medical care.

## 2016-11-12 LAB — WOUND CULTURE: Gram Stain: NONE SEEN

## 2016-11-14 ENCOUNTER — Telehealth (INDEPENDENT_AMBULATORY_CARE_PROVIDER_SITE_OTHER): Payer: Self-pay | Admitting: Orthopaedic Surgery

## 2016-11-14 NOTE — Telephone Encounter (Signed)
Patient's wife called this morning stating that her husband's toe does look better but it is still dark and has a little something coming out and the skin is also coming off around it.  CB#[phone] ext [phone].

## 2016-11-14 NOTE — Progress Notes (Signed)
Called twice with results- no answer. Please call in am to see how he is doing. If toe infection is still red needs to switch to doxycycline 100mg  po bid for 10 days

## 2016-11-15 NOTE — Telephone Encounter (Signed)
Called pt and his foot is much better and finished antibiotic

## 2016-11-15 NOTE — Telephone Encounter (Signed)
Would switch antibiotics to doxycycline 100mg  po bid for 1 week

## 2016-11-15 NOTE — Telephone Encounter (Signed)
Please advise 

## 2016-12-21 ENCOUNTER — Other Ambulatory Visit: Payer: Self-pay | Admitting: Oncology

## 2017-01-03 ENCOUNTER — Other Ambulatory Visit (HOSPITAL_BASED_OUTPATIENT_CLINIC_OR_DEPARTMENT_OTHER): Payer: Medicare Other

## 2017-01-03 DIAGNOSIS — Z8572 Personal history of non-Hodgkin lymphomas: Secondary | ICD-10-CM | POA: Diagnosis present

## 2017-01-03 DIAGNOSIS — C8200 Follicular lymphoma grade I, unspecified site: Secondary | ICD-10-CM | POA: Diagnosis not present

## 2017-01-03 DIAGNOSIS — I10 Essential (primary) hypertension: Secondary | ICD-10-CM

## 2017-01-03 LAB — CBC WITH DIFFERENTIAL/PLATELET
BASO%: 0.8 % (ref 0.0–2.0)
Basophils Absolute: 0 10*3/uL (ref 0.0–0.1)
EOS%: 3 % (ref 0.0–7.0)
Eosinophils Absolute: 0.2 10*3/uL (ref 0.0–0.5)
HCT: 38.1 % — ABNORMAL LOW (ref 38.4–49.9)
HGB: 13.1 g/dL (ref 13.0–17.1)
LYMPH%: 23.2 % (ref 14.0–49.0)
MCH: 31.5 pg (ref 27.2–33.4)
MCHC: 34.3 g/dL (ref 32.0–36.0)
MCV: 91.9 fL (ref 79.3–98.0)
MONO#: 0.5 10*3/uL (ref 0.1–0.9)
MONO%: 9.2 % (ref 0.0–14.0)
NEUT#: 3.5 10*3/uL (ref 1.5–6.5)
NEUT%: 63.8 % (ref 39.0–75.0)
Platelets: 204 10*3/uL (ref 140–400)
RBC: 4.14 10*6/uL — ABNORMAL LOW (ref 4.20–5.82)
RDW: 13.6 % (ref 11.0–14.6)
WBC: 5.4 10*3/uL (ref 4.0–10.3)
lymph#: 1.3 10*3/uL (ref 0.9–3.3)

## 2017-01-03 LAB — COMPREHENSIVE METABOLIC PANEL
ALT: 8 U/L (ref 0–55)
AST: 13 U/L (ref 5–34)
Albumin: 3.9 g/dL (ref 3.5–5.0)
Alkaline Phosphatase: 96 U/L (ref 40–150)
Anion Gap: 8 mEq/L (ref 3–11)
BUN: 17.5 mg/dL (ref 7.0–26.0)
CO2: 28 mEq/L (ref 22–29)
Calcium: 9.4 mg/dL (ref 8.4–10.4)
Chloride: 104 mEq/L (ref 98–109)
Creatinine: 1 mg/dL (ref 0.7–1.3)
EGFR: 76 mL/min/{1.73_m2} — ABNORMAL LOW (ref >90)
Glucose: 89 mg/dl (ref 70–140)
Potassium: 4.1 mEq/L (ref 3.5–5.1)
Sodium: 140 mEq/L (ref 136–145)
Total Bilirubin: 0.92 mg/dL (ref 0.20–1.20)
Total Protein: 6.7 g/dL (ref 6.4–8.3)

## 2017-01-03 LAB — LACTATE DEHYDROGENASE: LDH: 134 U/L (ref 125–245)

## 2017-01-04 LAB — BETA 2 MICROGLOBULIN, SERUM: Beta-2: 1.8 mg/L (ref 0.6–2.4)

## 2017-01-10 ENCOUNTER — Other Ambulatory Visit: Payer: Medicare Other

## 2017-01-10 ENCOUNTER — Ambulatory Visit: Payer: Medicare Other | Admitting: Oncology

## 2017-01-14 ENCOUNTER — Ambulatory Visit (INDEPENDENT_AMBULATORY_CARE_PROVIDER_SITE_OTHER): Payer: Medicare Other | Admitting: Orthopaedic Surgery

## 2017-01-14 ENCOUNTER — Encounter (INDEPENDENT_AMBULATORY_CARE_PROVIDER_SITE_OTHER): Payer: Self-pay | Admitting: Orthopaedic Surgery

## 2017-01-14 VITALS — BP 90/54 | HR 73 | Resp 14 | Ht 70.0 in | Wt 141.0 lb

## 2017-01-14 DIAGNOSIS — M79642 Pain in left hand: Secondary | ICD-10-CM | POA: Diagnosis not present

## 2017-01-14 MED ORDER — DICLOFENAC SODIUM 1 % TD GEL: 2.0000 g | Freq: Four times a day (QID) | TRANSDERMAL | 1 refills | Status: DC

## 2017-01-14 NOTE — Progress Notes (Signed)
Office Visit Note   Patient: Willie Garcia Garcia           Date of Birth: 1945/09/29           MRN: [id number] Visit Date: 01/14/2017              Requested by: Prince Solian, Willie Garcia [address] [place], [place] [address] PCP: Prince Solian, Willie Garcia   Assessment & Plan: Visit Diagnoses:  1. Pain of left hand   Recurrent pain IP joint left thumb  Plan: Cortisone injection IP joint left thumb. Return as needed   Follow-Up Instructions: Return if symptoms worsen or fail to improve.   Orders:  No orders of the defined types were placed in this encounter.  Meds ordered this encounter  Medications  . diclofenac sodium (VOLTAREN) 1 % GEL    Sig: Apply 2 g topically 4 (four) times daily.    Dispense:  3 Tube    Refill:  1      Procedures: Small Joint Inj Date/Time: 01/14/2017 3:27 PM Performed by: Willie Garcia Garcia Authorized by: Willie Garcia Garcia   Location:  Thumb Site:  L thumb IP     Clinical Data: No additional findings.   Subjective: Chief Complaint  Patient presents with  . Left Hand - Pain    Willie Garcia Garcia is a 71 y o that is here for another injection into the Left thumb. His last injection was 10/02/16 and provided relief.  About 4 months status post injection IP joint left thumb with good relief. Having mild recurrent pain without swelling or redness. No significant loss of motion.  HPI  Review of Systems   Objective: Vital Signs: BP (!) 90/54   Pulse 73   Resp 14   Ht 5\' 10"  (1.778 m)   Wt 141 lb (64 kg)   BMI 20.23 kg/m   Physical Exam  Ortho Exam left thumb was some local tenderness along the ulnar side of the IP joint. No crepitation full extension. Flexed at least 50. No instability. Skin intact. Neurovascular exam intact. Prior penetrating injury but without evidence of neurovascular compromise or infection  Specialty Comments:  No specialty comments available.  Imaging: No results found.   PMFS History: Patient Active Problem List     Diagnosis Date Noted  . Chest pain at rest   . Abnormal nuclear stress test 07/14/2014  . Chest pain with high risk for cardiac etiology - Atypical Pain, but abnormal Myoview. 07/13/2014  . Hyperlipidemia 07/13/2014  . Iron deficiency anemia, unspecified 05/19/2013  . B12 deficiency 05/19/2013  . Dyspepsia 05/19/2013  . Scoliosis   . BPH (benign prostatic hyperplasia)   . 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 Abdominal bruit 10/09/2010  . Personal history of colonic adenomas 04/04/2010  . Coronary atherosclerosis 01/18/2009  . DYSLIPIDEMIA 10/02/2008  . Essential hypertension 10/02/2008   Past Medical History:  Diagnosis Date  . Allergic rhinitis   . Anemia   . Anxiety   . Basal cell carcinoma of skin 2015  . BPH (benign prostatic hyperplasia)   . CAD (coronary artery disease)   . Depression   . GERD (gastroesophageal reflux disease)   . HTN (hypertension)   . Hyperlipidemia   . Non Hodgkin's lymphoma ([place])   . Osteopenia   . Personal history of colonic adenomas 04/04/2010  . Scoliosis   . 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  Heart disease Mother   . Heart attack Mother 14  . Heart disease Father   . Heart attack Father 32  . Heart attack Brother 52  . Diabetes Brother   . Aneurysm Paternal Uncle   . Aneurysm Maternal Grandmother   . Heart attack Maternal Grandmother   . Aneurysm Maternal Grandfather   . Heart attack Maternal Grandfather   . Colon cancer Neg Hx   . Stomach cancer Neg Hx   . Esophageal cancer Neg Hx   . Rectal cancer Neg Hx     Past Surgical History:  Procedure Laterality Date  . CARDIAC CATHETERIZATION  07/14/2014   Procedure: LEFT HEART CATH AND CORS/GRAFTS ANGIOGRAPHY;  Surgeon: Jettie Booze, Willie Garcia;  Location: [facility] CATH LAB;  Service: Cardiovascular;;  . CARDIAC CATHETERIZATION  07/14/2014   Procedure: INTRAVASCULAR PRESSURE WIRE/FFR STUDY;  Surgeon: Jettie Booze, Willie Garcia;   Location: [facility] CATH LAB;  Service: Cardiovascular;;  circ  . CERVICAL SPINE SURGERY     C4-C6  . COLONOSCOPY  06/2013   negative  . CORONARY ARTERY BYPASS GRAFT    . HERNIA REPAIR    . RHINOPLASTY    . UPPER GASTROINTESTINAL ENDOSCOPY     Social History   Occupational History  . Retired    Social History Main Topics  . Smoking status: Never Smoker  . Smokeless tobacco: Never Used  . Alcohol use No  . Drug use: No  . Sexual activity: Not on file     Willie Garcia Garcia, Willie Garcia   Note - This record has been created using [employer].  Chart creation errors have been sought, but may not always  have been located. Such creation errors do not reflect on  the standard of medical care.

## 2017-01-20 NOTE — Progress Notes (Signed)
ID: Willie Garcia   DOB: 10/01/1945  MR#: [id number]  WUJ#:[id number]  NFA:OZHY, Steva Ready, MD SU: OTHER MD: Kristeen Miss, [name], Silvano Rusk,  Lendell Caprice     HISTORY OF PRESENT ILLNESS:  from the original intake note:  Willie Garcia has long had back problems and has been followed for this by Dr. [name].  On November 20, 2005, the patient had an MRI of the lumbar spine without contrast for evaluation of his chronic progressive low back and left buttock pain.  This showed confluent retroperitoneal adenopathy encasing the renal vessels and measuring up to 5.2 x 3.4 cm transversely. There was no epidural mass and the kidneys appeared unremarkable.  This had been compared with an MRI from Nov 09, 2004 where no such adenopathy was noted.    The patient's wife, [name], is a Museum/gallery curator at [name], so she brought this to the attention of [name] and a CT Scan of the chest, abdomen and pelvis was obtained there on June 11th.  It was ready by [name], showing some small mediastinal lymph nodes, the largest being 1.4 cm, but significant adenopathy surrounding the left renal vein with anterior displacement of that vein as well as the inferior vena cava.  This measured 5.2 cm in transverse diameter and 3.8 cm in AP diameter.  The adenopathy extended inferiorly to the level of the aortic bifurcation.  There were small mesenteric lymph nodes, all less than 12 mm. The spleen was upper normal in size with no focal lesions.  There was no pelvic adenopathy.  His subsequent history is as detailed below  INTERVAL HISTORY: Willie Garcia returns today for follow-up of his low-grade follicular non-Hodgkin's B cell lymphoma, accompanied by his wife [name]. He continues under observation. We just obtained a beta 2 microglobulin and LDH as well as a lymphocyte count and other general labs. There is no evidence of disease activity by those studies.  He denies any further weight loss, unexplained fatigue, drenching sweats,  fevers, or adenopathy. He tells me he does have a perirectal rash which is bothering him. He associates this with the heat.  REVIEW OF SYSTEMS: He has had problems with toenails on both large toenails have had to be removed. They're growing back well, he says. A detailed review of systems today was otherwise stable.  PAST MEDICAL HISTORY: Past Medical History:  Diagnosis Date  . Allergic rhinitis   . Anemia   . Anxiety   . Basal cell carcinoma of skin 2015  . BPH (benign prostatic hyperplasia)   . CAD (coronary artery disease)   . Depression   . GERD (gastroesophageal reflux disease)   . HTN (hypertension)   . Hyperlipidemia   . Non Hodgkin's lymphoma ([place])   . Osteopenia   . Personal history of colonic adenomas 04/04/2010  . Scoliosis   . 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 CARDIAC CATHETERIZATION  07/14/2014   Procedure: LEFT HEART CATH AND CORS/GRAFTS ANGIOGRAPHY;  Surgeon: Jettie Booze, MD;  Location: [facility] CATH LAB;  Service: Cardiovascular;;  . CARDIAC CATHETERIZATION  07/14/2014   Procedure: INTRAVASCULAR PRESSURE WIRE/FFR STUDY;  Surgeon: Jettie Booze, MD;  Location: [facility] CATH LAB;  Service: Cardiovascular;;  circ  . CERVICAL SPINE SURGERY     C4-C6  . COLONOSCOPY  06/2013   negative  . CORONARY ARTERY BYPASS GRAFT    . HERNIA REPAIR    .  RHINOPLASTY    . UPPER GASTROINTESTINAL ENDOSCOPY      FAMILY HISTORY Family History  Problem Relation Age of Onset  . Heart disease Mother   . Heart attack Mother 42  . Heart disease Father   . Heart attack Father 63  . Heart attack Brother 60  . Diabetes Brother   . Aneurysm Paternal Uncle   . Aneurysm Maternal Grandmother   . Heart attack Maternal Grandmother   . Aneurysm Maternal Grandfather   . Heart attack Maternal Grandfather   . Colon cancer Neg Hx   . Stomach cancer Neg Hx   . Esophageal cancer Neg Hx   . Rectal  cancer Neg Hx   The patient's father died at the age of 42 from heart problems.  The patient's mother died at the age of 73 from heart problems.  The patient has three brothers and two sisters.  The only cancer in the family known to the patient is the maternal grandmother, who had breast cancer diagnosed in her fifties or 32.    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  He has been married to [place] for >30 years.  They have no children of their own.She worked for [name] until August 2018.  They attend [employer]   ADVANCED DIRECTIVES: in place  HEALTH MAINTENANCE: Social History  Substance Use Topics  . Smoking status: Never Smoker  . Smokeless tobacco: Never Used  . Alcohol use No     Colonoscopy: JAN 2015 (with EGD)-- WNL  PSA:  Bone density:  Lipid panel:  No Known Allergies  Current Outpatient Prescriptions  Medication Sig Dispense Refill  . acetaminophen (TYLENOL) 325 MG tablet Take 2 tablets (650 mg total) by mouth 3 (three) times daily.    Marland Kitchen aspirin 81 MG tablet Take 1 tablet (81 mg total) by mouth daily.    Marland Kitchen CALCIUM-VITAMIN D PO Take 1 tablet by mouth 2 (two) times daily. (600-200)    . Cholecalciferol (VITAMIN D3) 2000 UNITS TABS Take 2,000 Units by mouth daily.    . Cyanocobalamin (VITAMIN B-12 IJ) Inject 1 mL as directed every 30 (thirty) days.     . diclofenac sodium (VOLTAREN) 1 % GEL Apply 2 g topically 4 (four) times daily. 3 Tube 1  . dicyclomine (BENTYL) 20 MG tablet TAKE ONE TABLET EVERY 6 HOURS AS NEEDED FOR SPASMS. 120 tablet 11  . escitalopram (LEXAPRO) 10 MG tablet Take 1 tablet (10 mg total) by mouth daily. 90 tablet 6  . ezetimibe (ZETIA) 10 MG tablet Take 10 mg by mouth daily.    . fluconazole (DIFLUCAN) 100 MG tablet Take 1 tablet (100 mg total) by mouth daily. 20 tablet 0  . fluticasone (FLONASE) 50 MCG/ACT nasal spray Place 2 sprays into both nostrils daily as needed for allergies or  rhinitis.     . folic acid (FOLVITE) 1 MG tablet Take 1 mg by mouth daily.      Marland Kitchen gabapentin (NEURONTIN) 300 MG capsule Take 1 capsule (300 mg total) by mouth at bedtime. 90 capsule 4  . ketoconazole (NIZORAL) 2 % cream Apply 1 application topically daily. 15 g 0  . loratadine (CLARITIN) 10 MG tablet Take 10 mg by mouth daily as needed for allergies.    Marland Kitchen metoCLOPramide (REGLAN) 5 MG tablet TAKE 1 TABLET THREE TIMES DAILY BEFORE MEALS AS NEEDED. 90 tablet 2  . metoprolol succinate (TOPROL-XL) 25 MG 24 hr tablet Take 1 tablet (25  mg total) by mouth daily. 30 tablet 6  . nitroGLYCERIN (NITROSTAT) 0.4 MG SL tablet Place 1 tablet (0.4 mg total) under the tongue every 5 (five) minutes as needed for chest pain. (Patient taking differently: Place 0.4 mg under the tongue every 5 (five) minutes as needed for chest pain (MAX 3 TABLETS). ) 10 tablet 0  . pantoprazole (PROTONIX) 20 MG tablet Take 1 tablet (20 mg total) by mouth daily before breakfast. 30 tablet 11  . rosuvastatin (CRESTOR) 20 MG tablet Take 20 mg by mouth daily.      Marland Kitchen testosterone cypionate (DEPOTESTOTERONE CYPIONATE) 200 MG/ML injection Inject 50 mg into the muscle once a week. AS DIRECTED    . vitamin C (ASCORBIC ACID) 500 MG tablet Take 500 mg by mouth daily.     No current facility-administered medications for this visit.     OBJECTIVE: Middle-aged white manWho appears stated age   1:   01/21/17 1426  BP: 118/62  Pulse: 63  Resp: 18  Temp: 97.9 F (36.6 C)     Body mass index is 20.83 kg/m.    ECOG FS: 1  Sclerae unicteric, pupils round and equal Oropharynx clear and moist No cervical or supraclavicular adenopathy, no axillary or inguinal adenopathy Lungs no rales or rhonchi Heart regular rate and rhythm Abd soft, nontender, positive bowel sounds MSK scoliosis but no focal spinal tenderness, no upper extremity lymphedema; both large toenails are growing back well Neuro: nonfocal, well oriented, appropriate  affect     LAB RESULTS: Lab Results  Component Value Date   WBC 5.4 01/03/2017   NEUTROABS 3.5 01/03/2017   HGB 13.1 01/03/2017   HCT 38.1 (L) 01/03/2017   MCV 91.9 01/03/2017   PLT 204 01/03/2017      Chemistry      Component Value Date/Time   NA 140 01/03/2017 1033   K 4.1 01/03/2017 1033   CL 104 07/14/2014 0540   CL 101 06/30/2012 1325   CO2 28 01/03/2017 1033   BUN 17.5 01/03/2017 1033   CREATININE 1.0 01/03/2017 1033      Component Value Date/Time   CALCIUM 9.4 01/03/2017 1033   ALKPHOS 96 01/03/2017 1033   AST 13 01/03/2017 1033   ALT 8 01/03/2017 1033   BILITOT 0.92 01/03/2017 1033       No results found for: LABCA2  No components found for: LABCA125  No results for input(s): INR in the last 168 hours.  Urinalysis No results found for: COLORURINE  STUDIES: No results found.   ASSESSMENT: 71 y.o. 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   PLAN:  Willie Garcia is minimal 11 years out from initial diagnosis of his follicular non-Hodgkin's lymphoma and a little over 10 years out from his last treatment, with no evidence of disease activity. This is very favorable.  His weight continues to be marginal but it is a little better than last year and I encouraged that. Further rash she describes I prescribed ketoconazole cream and if that doesn't work he continues Diflucan.  What limits him the most is the back pain. He continues to work on this with Dr. [name].  Otherwise we reviewed his lab work all of which is very favorable. He will return to see me  in one year. He knows to call for any problems that may develop before his next visit here, which will be in one  year.    Willie Garcia C    01/21/2017

## 2017-01-21 ENCOUNTER — Ambulatory Visit (HOSPITAL_BASED_OUTPATIENT_CLINIC_OR_DEPARTMENT_OTHER): Payer: Medicare Other | Admitting: Oncology

## 2017-01-21 VITALS — BP 118/62 | HR 63 | Temp 97.9°F | Resp 18 | Ht 70.0 in | Wt 145.2 lb

## 2017-01-21 DIAGNOSIS — L821 Other seborrheic keratosis: Secondary | ICD-10-CM | POA: Diagnosis not present

## 2017-01-21 DIAGNOSIS — R21 Rash and other nonspecific skin eruption: Secondary | ICD-10-CM

## 2017-01-21 DIAGNOSIS — D1801 Hemangioma of skin and subcutaneous tissue: Secondary | ICD-10-CM | POA: Diagnosis not present

## 2017-01-21 DIAGNOSIS — C8201 Follicular lymphoma grade I, lymph nodes of head, face, and neck: Secondary | ICD-10-CM

## 2017-01-21 DIAGNOSIS — Z85828 Personal history of other malignant neoplasm of skin: Secondary | ICD-10-CM | POA: Diagnosis not present

## 2017-01-21 DIAGNOSIS — Z8572 Personal history of non-Hodgkin lymphomas: Secondary | ICD-10-CM | POA: Diagnosis not present

## 2017-01-21 DIAGNOSIS — L570 Actinic keratosis: Secondary | ICD-10-CM | POA: Diagnosis not present

## 2017-01-21 DIAGNOSIS — M545 Low back pain: Secondary | ICD-10-CM | POA: Diagnosis not present

## 2017-01-21 MED ORDER — KETOCONAZOLE 2 % EX CREA: 1.0000 "application " | TOPICAL_CREAM | Freq: Every day | CUTANEOUS | 0 refills | Status: DC

## 2017-01-21 MED ORDER — FLUCONAZOLE 100 MG PO TABS: 100.0000 mg | ORAL_TABLET | Freq: Every day | ORAL | 0 refills | Status: DC

## 2017-01-21 MED ORDER — GABAPENTIN 300 MG PO CAPS: 300.0000 mg | ORAL_CAPSULE | Freq: Every day | ORAL | 4 refills | Status: DC

## 2017-02-12 ENCOUNTER — Ambulatory Visit (INDEPENDENT_AMBULATORY_CARE_PROVIDER_SITE_OTHER): Payer: Medicare Other | Admitting: Internal Medicine

## 2017-02-12 ENCOUNTER — Encounter: Payer: Self-pay | Admitting: Internal Medicine

## 2017-02-12 VITALS — BP 108/70 | HR 51 | Ht 70.0 in | Wt 147.6 lb

## 2017-02-12 DIAGNOSIS — I2510 Atherosclerotic heart disease of native coronary artery without angina pectoris: Secondary | ICD-10-CM

## 2017-02-12 NOTE — Progress Notes (Signed)
HPI Mr. Gobert returns today for follow-up. He is a 71 year old man with three-vessel coronary disease status post bypass surgery 19 years ago. The patient has been stable in the interim. He denies chest pain or shortness of breath. He has very minimal swelling in his feet and lower extremities. He has not had any symptomatic arrhythmias. He denies chest pain. He remains active working around his house.  No Known Allergies   Current Outpatient Prescriptions  Medication Sig Dispense Refill  . acetaminophen (TYLENOL) 500 MG tablet Take 500 mg by mouth every 6 (six) hours as needed (pain).    Marland Kitchen aspirin 81 MG tablet Take 1 tablet (81 mg total) by mouth daily.    Marland Kitchen CALCIUM-VITAMIN D PO Take 1 tablet by mouth 2 (two) times daily. (600-200)    . Cholecalciferol (VITAMIN D3) 2000 UNITS TABS Take 2,000 Units by mouth daily.    . Cyanocobalamin (VITAMIN B-12 IJ) Inject 1 mL as directed every 30 (thirty) days.     Marland Kitchen dicyclomine (BENTYL) 20 MG tablet Take 20 mg by mouth every 6 (six) hours as needed for spasms.    Marland Kitchen escitalopram (LEXAPRO) 10 MG tablet Take 1 tablet (10 mg total) by mouth daily. 90 tablet 6  . ezetimibe (ZETIA) 10 MG tablet Take 10 mg by mouth daily.    . fluticasone (FLONASE) 50 MCG/ACT nasal spray Place 2 sprays into both nostrils daily as needed for allergies or rhinitis.     . folic acid (FOLVITE) 1 MG tablet Take 1 mg by mouth daily.      Marland Kitchen gabapentin (NEURONTIN) 300 MG capsule Take 1 capsule (300 mg total) by mouth at bedtime. 90 capsule 4  . loratadine (CLARITIN) 10 MG tablet Take 10 mg by mouth daily as needed for allergies.    Marland Kitchen metoCLOPramide (REGLAN) 5 MG tablet as directed. TAKE 1 TABLET BY MOUTH THREE TIMES DAILY BEFORE MEALS AS NEEDED.    . metoprolol succinate (TOPROL-XL) 25 MG 24 hr tablet Take 1 tablet (25 mg total) by mouth daily. 30 tablet 6  . nitroGLYCERIN (NITROSTAT) 0.4 MG SL tablet Place 1 tablet (0.4 mg total) under the tongue every 5 (five) minutes as  needed for chest pain. 10 tablet 0  . pantoprazole (PROTONIX) 20 MG tablet Take 20 mg by mouth daily as needed for heartburn or indigestion.    . rosuvastatin (CRESTOR) 20 MG tablet Take 20 mg by mouth daily.      Marland Kitchen testosterone cypionate (DEPOTESTOTERONE CYPIONATE) 200 MG/ML injection Inject 50 mg into the muscle once a week. AS DIRECTED    . vitamin C (ASCORBIC ACID) 500 MG tablet Take 500 mg by mouth daily.    . fluconazole (DIFLUCAN) 100 MG tablet Take 1 tablet (100 mg total) by mouth daily. (Patient not taking: Reported on 02/12/2017) 20 tablet 0  . ketoconazole (NIZORAL) 2 % cream Apply 1 application topically daily. (Patient not taking: Reported on 02/12/2017) 15 g 0   No current facility-administered medications for this visit.      Past Medical History:  Diagnosis Date  . Allergic rhinitis   . Anemia   . Anxiety   . Basal cell carcinoma of skin 2015  . BPH (benign prostatic hyperplasia)   . CAD (coronary artery disease)   . Depression   . GERD (gastroesophageal reflux disease)   . HTN (hypertension)   . Hyperlipidemia   . Non Hodgkin's lymphoma ([place])   . Osteopenia   . Personal history  of colonic adenomas 04/04/2010  . Scoliosis   . 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   Past Surgical History:  Procedure Laterality Date  . CARDIAC CATHETERIZATION  07/14/2014   Procedure: LEFT HEART CATH AND CORS/GRAFTS ANGIOGRAPHY;  Surgeon: Jettie Booze, MD;  Location[facility] CATH LAB;  Service: Cardiovascular;;  . CARDIAC CATHETERIZATION  07/14/2014   Procedure: INTRAVASCULAR PRESSURE WIRE/FFR STUDY;  Surgeon: Jettie Booze, MD;  Location: [facility] CATH LAB;  Service: Cardiovascular;;  circ  . CERVICAL SPINE SURGERY     C4-C6  . COLONOSCOPY  06/2013   negative  . CORONARY ARTERY BYPASS GRAFT    . HERNIA REPAIR    . RHINOPLASTY    . UPPER GASTROINTESTINAL ENDOSCOPY       Family History  Problem  Relation Age of Onset  . Heart disease Mother   . Heart attack Mother 41  . Heart disease Father   . Heart attack Father 73  . Heart attack Brother 50  . Diabetes Brother   . Aneurysm Paternal Uncle   . Aneurysm Maternal Grandmother   . Heart attack Maternal Grandmother   . Aneurysm Maternal Grandfather   . Heart attack Maternal Grandfather   . Colon cancer Neg Hx   . Stomach cancer Neg Hx   . Esophageal cancer Neg Hx   . Rectal cancer Neg Hx      Social History   Social History  . Marital status: Married    Spouse name: N/A  . Number of children: N/A  . Years of education: N/A   Occupational History  . Retired    Social History Main Topics  . Smoking status: Never Smoker  . Smokeless tobacco: Never Used  . Alcohol use No  . Drug use: No  . Sexual activity: Not on file   Other Topics Concern  . Not on file   Social History Narrative   Married, wife is a Marine scientist, no children   Retired Microbiologist, flies as a hobby   2 caffeinated beverages daily     BP 108/70   Pulse (!) 51   Ht 5\' 10"  (1.778 m)   Wt 147 lb 9.6 oz (67 kg)   SpO2 96%   BMI 21.18 kg/m   Physical Exam:  Well appearing 71 year old man, NAD HEENT: Unremarkable Neck:  6 cm  JVD, no thyromegally Lymphatics:  No adenopathy Back:  No CVA tenderness Lungs:  Clear, With no wheezes, rales, or rhonchi.  HEART:  Regular rate rhythm, no murmurs, no rubs, no clicks Abd:  soft, positive bowel sounds, no organomegally, no rebound, no guarding Ext:  2 plus pulses, no edema, no cyanosis, no clubbing Skin:  No rashes no nodules Neuro:  CN II through XII intact, motor grossly intact  EKG - Normal sinus rhythm with sinus bradycardia   Assess/Plan: 1. Coronary artery disease status post bypass surgery - he has no anginal symptoms. He will continue his current medical therapy 2. Hypertension - his blood pressure has remained stable. His weight is stable as well. 3. Dyslipidemia - he will  continue statin therapy with Crestor. Fasting labs are pending.  Cristopher Peru, M.D.

## 2017-02-12 NOTE — Patient Instructions (Addendum)

## 2017-03-07 DIAGNOSIS — M4120 Other idiopathic scoliosis, site unspecified: Secondary | ICD-10-CM | POA: Diagnosis not present

## 2017-03-30 DIAGNOSIS — Z23 Encounter for immunization: Secondary | ICD-10-CM | POA: Diagnosis not present

## 2017-04-02 DIAGNOSIS — M48061 Spinal stenosis, lumbar region without neurogenic claudication: Secondary | ICD-10-CM | POA: Diagnosis not present

## 2017-04-02 DIAGNOSIS — M4726 Other spondylosis with radiculopathy, lumbar region: Secondary | ICD-10-CM | POA: Diagnosis not present

## 2017-04-02 DIAGNOSIS — M5136 Other intervertebral disc degeneration, lumbar region: Secondary | ICD-10-CM | POA: Diagnosis not present

## 2017-04-17 ENCOUNTER — Other Ambulatory Visit: Payer: Self-pay | Admitting: Oncology

## 2017-04-22 DIAGNOSIS — R351 Nocturia: Secondary | ICD-10-CM | POA: Diagnosis not present

## 2017-04-22 DIAGNOSIS — N401 Enlarged prostate with lower urinary tract symptoms: Secondary | ICD-10-CM | POA: Diagnosis not present

## 2017-04-22 DIAGNOSIS — E291 Testicular hypofunction: Secondary | ICD-10-CM | POA: Diagnosis not present

## 2017-04-24 DIAGNOSIS — E291 Testicular hypofunction: Secondary | ICD-10-CM | POA: Diagnosis not present

## 2017-05-02 DIAGNOSIS — M4126 Other idiopathic scoliosis, lumbar region: Secondary | ICD-10-CM | POA: Diagnosis not present

## 2017-05-02 DIAGNOSIS — M4120 Other idiopathic scoliosis, site unspecified: Secondary | ICD-10-CM | POA: Diagnosis not present

## 2017-05-02 DIAGNOSIS — Z23 Encounter for immunization: Secondary | ICD-10-CM | POA: Diagnosis not present

## 2017-05-11 ENCOUNTER — Other Ambulatory Visit: Payer: Self-pay | Admitting: Internal Medicine

## 2017-06-18 HISTORY — PX: OTHER SURGICAL HISTORY: SHX169

## 2017-07-24 DIAGNOSIS — D1801 Hemangioma of skin and subcutaneous tissue: Secondary | ICD-10-CM | POA: Diagnosis not present

## 2017-07-24 DIAGNOSIS — L570 Actinic keratosis: Secondary | ICD-10-CM | POA: Diagnosis not present

## 2017-07-24 DIAGNOSIS — L821 Other seborrheic keratosis: Secondary | ICD-10-CM | POA: Diagnosis not present

## 2017-07-24 DIAGNOSIS — Z85828 Personal history of other malignant neoplasm of skin: Secondary | ICD-10-CM | POA: Diagnosis not present

## 2017-08-08 DIAGNOSIS — R2232 Localized swelling, mass and lump, left upper limb: Secondary | ICD-10-CM | POA: Diagnosis not present

## 2017-08-09 ENCOUNTER — Other Ambulatory Visit: Payer: Self-pay | Admitting: Internal Medicine

## 2017-08-09 DIAGNOSIS — I10 Essential (primary) hypertension: Secondary | ICD-10-CM

## 2017-08-30 DIAGNOSIS — Z6822 Body mass index (BMI) 22.0-22.9, adult: Secondary | ICD-10-CM | POA: Diagnosis not present

## 2017-08-30 DIAGNOSIS — J329 Chronic sinusitis, unspecified: Secondary | ICD-10-CM | POA: Diagnosis not present

## 2017-08-30 DIAGNOSIS — H6691 Otitis media, unspecified, right ear: Secondary | ICD-10-CM | POA: Diagnosis not present

## 2017-08-30 DIAGNOSIS — R509 Fever, unspecified: Secondary | ICD-10-CM | POA: Diagnosis not present

## 2017-09-04 ENCOUNTER — Other Ambulatory Visit: Payer: Self-pay

## 2017-09-04 DIAGNOSIS — M7989 Other specified soft tissue disorders: Secondary | ICD-10-CM | POA: Diagnosis not present

## 2017-09-04 DIAGNOSIS — M19042 Primary osteoarthritis, left hand: Secondary | ICD-10-CM | POA: Diagnosis not present

## 2017-09-04 DIAGNOSIS — D2362 Other benign neoplasm of skin of left upper limb, including shoulder: Secondary | ICD-10-CM | POA: Diagnosis not present

## 2017-09-04 DIAGNOSIS — B079 Viral wart, unspecified: Secondary | ICD-10-CM | POA: Diagnosis not present

## 2017-09-04 DIAGNOSIS — M25742 Osteophyte, left hand: Secondary | ICD-10-CM | POA: Diagnosis not present

## 2017-09-04 DIAGNOSIS — D2112 Benign neoplasm of connective and other soft tissue of left upper limb, including shoulder: Secondary | ICD-10-CM | POA: Diagnosis not present

## 2017-09-16 ENCOUNTER — Other Ambulatory Visit: Payer: Self-pay | Admitting: Oncology

## 2017-09-16 ENCOUNTER — Other Ambulatory Visit: Payer: Self-pay | Admitting: Internal Medicine

## 2017-09-20 ENCOUNTER — Ambulatory Visit (INDEPENDENT_AMBULATORY_CARE_PROVIDER_SITE_OTHER): Payer: Medicare Other | Admitting: Internal Medicine

## 2017-09-20 ENCOUNTER — Encounter: Payer: Self-pay | Admitting: Internal Medicine

## 2017-09-20 VITALS — BP 96/60 | HR 68 | Ht 69.0 in | Wt 147.1 lb

## 2017-09-20 DIAGNOSIS — R1013 Epigastric pain: Secondary | ICD-10-CM | POA: Diagnosis not present

## 2017-09-20 DIAGNOSIS — L989 Disorder of the skin and subcutaneous tissue, unspecified: Secondary | ICD-10-CM

## 2017-09-20 MED ORDER — METOCLOPRAMIDE HCL 5 MG PO TABS: ORAL_TABLET | ORAL | 3 refills | Status: DC

## 2017-09-20 MED ORDER — PANTOPRAZOLE SODIUM 20 MG PO TBEC: 20.0000 mg | DELAYED_RELEASE_TABLET | Freq: Every day | ORAL | 3 refills | Status: DC

## 2017-09-20 MED ORDER — DICYCLOMINE HCL 20 MG PO TABS: 20.0000 mg | ORAL_TABLET | Freq: Four times a day (QID) | ORAL | 3 refills | Status: DC | PRN

## 2017-09-20 NOTE — Patient Instructions (Signed)
If you are age 72 or older, your body mass index should be between 23-30. Your Body mass index is 21.73 kg/m. If this is out of the aforementioned range listed, please consider follow up with your Primary Care Provider.  If you are age 66 or younger, your body mass index should be between 19-25. Your Body mass index is 21.73 kg/m. If this is out of the aformentioned range listed, please consider follow up with your Primary Care Provider.    We have sent the following medications to your pharmacy for you to pick up at your convenience: Reglan Bentyl Protonix  Please show your dermatologist the skin nodule in your perianal area.   Thank you,  Dr. [name]

## 2017-09-20 NOTE — Progress Notes (Signed)
Willie Garcia 72 y.o. 05/02/1946 [id number]  Assessment & Plan:   Encounter Diagnoses  Name Primary?  . Dyspepsia Yes  . Disorder of perianal skin     Refill pantoprazole, dicyclomine and metaclopramide  Has recall colonoscopy (hx polyps ) early 2020  Show perianal nodule to dermatologist when he sees him - watch for changes in size or if sxs develop  I appreciate the opportunity to care for him. UD:JSHF, Ravisankar, MD   Subjective:   Chief Complaint:  HPI Here for f/u of dyspepsia Last seen 2017 Using dicyclomine and metaclopramdie prn and pantoprazole daily and sxs are controlled.No signs of tardive dyskinesia. Sometimes has movements in his sleep per wife.  Has a small nodule in anal area - no sxs - wants me to look at it   No Known Allergies Current Meds  Medication Sig  . acetaminophen (TYLENOL) 500 MG tablet Take 500 mg by mouth every 6 (six) hours as needed (pain).  Marland Kitchen aspirin 81 MG tablet Take 1 tablet (81 mg total) by mouth daily.  Marland Kitchen CALCIUM-VITAMIN D PO Take 1 tablet by mouth 2 (two) times daily. (600-200)  . Cholecalciferol (VITAMIN D3) 2000 UNITS TABS Take 2,000 Units by mouth daily.  . Cyanocobalamin (VITAMIN B-12 IJ) Inject 1 mL as directed every 30 (thirty) days.   Marland Kitchen dicyclomine (BENTYL) 20 MG tablet Take 1 tablet (20 mg total) by mouth every 6 (six) hours as needed for spasms.  Marland Kitchen escitalopram (LEXAPRO) 10 MG tablet TAKE 1 TABLET EACH DAY.  Marland Kitchen ezetimibe (ZETIA) 10 MG tablet Take 10 mg by mouth daily.  . fluticasone (FLONASE) 50 MCG/ACT nasal spray Place 2 sprays into both nostrils daily as needed for allergies or rhinitis.   . folic acid (FOLVITE) 1 MG tablet Take 1 mg by mouth daily.    Marland Kitchen gabapentin (NEURONTIN) 300 MG capsule Take 1 capsule (300 mg total) by mouth at bedtime.  Marland Kitchen loratadine (CLARITIN) 10 MG tablet Take 10 mg by mouth daily as needed for allergies.  Marland Kitchen metoCLOPramide (REGLAN) 5 MG tablet TAKE 1 TABLET BY MOUTH THREE TIMES DAILY  BEFORE MEALS AS NEEDED.  . metoprolol succinate (TOPROL-XL) 25 MG 24 hr tablet TAKE 1 TABLET ONCE DAILY.  . pantoprazole (PROTONIX) 20 MG tablet Take 1 tablet (20 mg total) by mouth daily before breakfast.  . rosuvastatin (CRESTOR) 20 MG tablet Take 20 mg by mouth daily.    Marland Kitchen testosterone cypionate (DEPOTESTOTERONE CYPIONATE) 200 MG/ML injection Inject 50 mg into the muscle once a week. AS DIRECTED  . vitamin C (ASCORBIC ACID) 500 MG tablet Take 500 mg by mouth daily.   Past Medical History:  Diagnosis Date  . Allergic rhinitis   . Anemia   . Anxiety   . Basal cell carcinoma of skin 2015  . BPH (benign prostatic hyperplasia)   . CAD (coronary artery disease)   . Depression   . Ganglion cyst    left thumb  . GERD (gastroesophageal reflux disease)   . HTN (hypertension)   . Hyperlipidemia   . Non Hodgkin's lymphoma ([place])   . Osteopenia   . Personal history of colonic adenomas 04/04/2010  . Scoliosis   . 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 CARDIAC CATHETERIZATION  07/14/2014   Procedure: LEFT HEART CATH AND CORS/GRAFTS ANGIOGRAPHY;  Surgeon: Jettie Booze, MD;  Location: [facility] CATH LAB;  Service: Cardiovascular;;  . CARDIAC CATHETERIZATION  07/14/2014   Procedure: INTRAVASCULAR PRESSURE WIRE/FFR STUDY;  Surgeon: Jettie Booze, MD;  Location: [facility] CATH LAB;  Service: Cardiovascular;;  circ  . CERVICAL SPINE SURGERY     C4-C6  . COLONOSCOPY  06/2013   negative  . CORONARY ARTERY BYPASS GRAFT    . HERNIA REPAIR    . RHINOPLASTY    . UPPER GASTROINTESTINAL ENDOSCOPY     Social History   Social History Narrative   Married, wife is a Marine scientist, no children   Retired Microbiologist, flies as a hobby   2 caffeinated beverages daily   family history includes Aneurysm in his maternal grandfather, maternal grandmother, and paternal uncle; Diabetes in his brother; Heart attack in his maternal grandfather and  maternal grandmother; Heart attack (age of onset: 45) in his brother; Heart attack (age of onset: 54) in his mother; Heart attack (age of onset: 9) in his father; Heart disease in his father and mother.   Review of Systems As above  Objective:   Physical Exam BP 96/60 (BP Location: Left Arm, Patient Position: Sitting, Cuff Size: Normal)   Pulse 68   Ht 5\' 9"  (1.753 m)   Wt 147 lb 2 oz (66.7 kg)   BMI 21.73 kg/m  NAD No tardive dyskinesia or tremors  Perianal exam - pin-head sized flesh-colored nodule on right side - no drainage - innocent in apparance

## 2017-09-30 DIAGNOSIS — J069 Acute upper respiratory infection, unspecified: Secondary | ICD-10-CM | POA: Diagnosis not present

## 2017-09-30 DIAGNOSIS — R05 Cough: Secondary | ICD-10-CM | POA: Diagnosis not present

## 2017-09-30 DIAGNOSIS — R300 Dysuria: Secondary | ICD-10-CM | POA: Diagnosis not present

## 2017-10-15 DIAGNOSIS — R311 Benign essential microscopic hematuria: Secondary | ICD-10-CM | POA: Diagnosis not present

## 2017-10-15 DIAGNOSIS — N401 Enlarged prostate with lower urinary tract symptoms: Secondary | ICD-10-CM | POA: Diagnosis not present

## 2017-10-17 DIAGNOSIS — R3121 Asymptomatic microscopic hematuria: Secondary | ICD-10-CM | POA: Diagnosis not present

## 2017-10-17 DIAGNOSIS — E291 Testicular hypofunction: Secondary | ICD-10-CM | POA: Diagnosis not present

## 2017-10-17 DIAGNOSIS — R3129 Other microscopic hematuria: Secondary | ICD-10-CM | POA: Diagnosis not present

## 2017-10-21 DIAGNOSIS — R351 Nocturia: Secondary | ICD-10-CM | POA: Diagnosis not present

## 2017-10-21 DIAGNOSIS — R3121 Asymptomatic microscopic hematuria: Secondary | ICD-10-CM | POA: Diagnosis not present

## 2017-10-21 DIAGNOSIS — N401 Enlarged prostate with lower urinary tract symptoms: Secondary | ICD-10-CM | POA: Diagnosis not present

## 2017-10-21 DIAGNOSIS — E291 Testicular hypofunction: Secondary | ICD-10-CM | POA: Diagnosis not present

## 2017-11-19 DIAGNOSIS — Z85828 Personal history of other malignant neoplasm of skin: Secondary | ICD-10-CM | POA: Diagnosis not present

## 2017-11-19 DIAGNOSIS — L723 Sebaceous cyst: Secondary | ICD-10-CM | POA: Diagnosis not present

## 2017-11-25 DIAGNOSIS — D510 Vitamin B12 deficiency anemia due to intrinsic factor deficiency: Secondary | ICD-10-CM | POA: Diagnosis not present

## 2017-11-25 DIAGNOSIS — R6884 Jaw pain: Secondary | ICD-10-CM | POA: Diagnosis not present

## 2017-11-25 DIAGNOSIS — Z125 Encounter for screening for malignant neoplasm of prostate: Secondary | ICD-10-CM | POA: Diagnosis not present

## 2017-11-25 DIAGNOSIS — R82998 Other abnormal findings in urine: Secondary | ICD-10-CM | POA: Diagnosis not present

## 2017-11-25 DIAGNOSIS — E7849 Other hyperlipidemia: Secondary | ICD-10-CM | POA: Diagnosis not present

## 2017-11-25 DIAGNOSIS — M859 Disorder of bone density and structure, unspecified: Secondary | ICD-10-CM | POA: Diagnosis not present

## 2017-11-25 DIAGNOSIS — I10 Essential (primary) hypertension: Secondary | ICD-10-CM | POA: Diagnosis not present

## 2017-11-25 DIAGNOSIS — E291 Testicular hypofunction: Secondary | ICD-10-CM | POA: Diagnosis not present

## 2017-12-02 ENCOUNTER — Other Ambulatory Visit: Payer: Self-pay

## 2017-12-02 DIAGNOSIS — D510 Vitamin B12 deficiency anemia due to intrinsic factor deficiency: Secondary | ICD-10-CM | POA: Diagnosis not present

## 2017-12-02 DIAGNOSIS — K219 Gastro-esophageal reflux disease without esophagitis: Secondary | ICD-10-CM | POA: Diagnosis not present

## 2017-12-02 DIAGNOSIS — Z6821 Body mass index (BMI) 21.0-21.9, adult: Secondary | ICD-10-CM | POA: Diagnosis not present

## 2017-12-02 DIAGNOSIS — I2510 Atherosclerotic heart disease of native coronary artery without angina pectoris: Secondary | ICD-10-CM | POA: Diagnosis not present

## 2017-12-02 DIAGNOSIS — I10 Essential (primary) hypertension: Secondary | ICD-10-CM | POA: Diagnosis not present

## 2017-12-02 DIAGNOSIS — M859 Disorder of bone density and structure, unspecified: Secondary | ICD-10-CM | POA: Diagnosis not present

## 2017-12-02 DIAGNOSIS — M5030 Other cervical disc degeneration, unspecified cervical region: Secondary | ICD-10-CM | POA: Diagnosis not present

## 2017-12-02 DIAGNOSIS — Z1389 Encounter for screening for other disorder: Secondary | ICD-10-CM | POA: Diagnosis not present

## 2017-12-02 DIAGNOSIS — E7849 Other hyperlipidemia: Secondary | ICD-10-CM | POA: Diagnosis not present

## 2017-12-02 DIAGNOSIS — F4321 Adjustment disorder with depressed mood: Secondary | ICD-10-CM | POA: Diagnosis not present

## 2017-12-02 DIAGNOSIS — J302 Other seasonal allergic rhinitis: Secondary | ICD-10-CM | POA: Diagnosis not present

## 2017-12-02 DIAGNOSIS — Z0000 Encounter for general adult medical examination without abnormal findings: Secondary | ICD-10-CM | POA: Diagnosis not present

## 2017-12-02 MED ORDER — METOPROLOL SUCCINATE ER 25 MG PO TB24: 25.0000 mg | ORAL_TABLET | Freq: Every day | ORAL | 1 refills | Status: DC

## 2017-12-13 DIAGNOSIS — K629 Disease of anus and rectum, unspecified: Secondary | ICD-10-CM | POA: Diagnosis not present

## 2018-01-02 DIAGNOSIS — M24642 Ankylosis, left hand: Secondary | ICD-10-CM | POA: Diagnosis not present

## 2018-01-14 ENCOUNTER — Inpatient Hospital Stay: Payer: Medicare Other | Attending: Oncology

## 2018-01-14 DIAGNOSIS — Z8572 Personal history of non-Hodgkin lymphomas: Secondary | ICD-10-CM | POA: Insufficient documentation

## 2018-01-14 DIAGNOSIS — C8200 Follicular lymphoma grade I, unspecified site: Secondary | ICD-10-CM

## 2018-01-14 DIAGNOSIS — M859 Disorder of bone density and structure, unspecified: Secondary | ICD-10-CM | POA: Diagnosis not present

## 2018-01-14 LAB — CBC WITH DIFFERENTIAL/PLATELET
Basophils Absolute: 0.1 10*3/uL (ref 0.0–0.1)
Basophils Relative: 1 %
Eosinophils Absolute: 0.2 10*3/uL (ref 0.0–0.5)
Eosinophils Relative: 2 %
HCT: 36.8 % — ABNORMAL LOW (ref 38.4–49.9)
Hemoglobin: 12.6 g/dL — ABNORMAL LOW (ref 13.0–17.1)
Lymphocytes Relative: 23 %
Lymphs Abs: 1.4 10*3/uL (ref 0.9–3.3)
MCH: 31.3 pg (ref 27.2–33.4)
MCHC: 34.3 g/dL (ref 32.0–36.0)
MCV: 91.3 fL (ref 79.3–98.0)
Monocytes Absolute: 0.6 10*3/uL (ref 0.1–0.9)
Monocytes Relative: 9 %
Neutro Abs: 4.1 10*3/uL (ref 1.5–6.5)
Neutrophils Relative %: 65 %
Platelets: 181 10*3/uL (ref 140–400)
RBC: 4.02 MIL/uL — ABNORMAL LOW (ref 4.20–5.82)
RDW: 13.3 % (ref 11.0–14.6)
WBC: 6.3 10*3/uL (ref 4.0–10.3)

## 2018-01-14 LAB — COMPREHENSIVE METABOLIC PANEL
ALT: 9 U/L (ref 0–44)
AST: 13 U/L — ABNORMAL LOW (ref 15–41)
Albumin: 4.1 g/dL (ref 3.5–5.0)
Alkaline Phosphatase: 83 U/L (ref 38–126)
Anion gap: 8 (ref 5–15)
BUN: 19 mg/dL (ref 8–23)
CO2: 29 mmol/L (ref 22–32)
Calcium: 9.2 mg/dL (ref 8.9–10.3)
Chloride: 103 mmol/L (ref 98–111)
Creatinine, Ser: 1.02 mg/dL (ref 0.61–1.24)
GFR calc Af Amer: >60 mL/min (ref >60)
GFR calc non Af Amer: >60 mL/min (ref >60)
Glucose, Bld: 83 mg/dL (ref 70–99)
Potassium: 4.1 mmol/L (ref 3.5–5.1)
Sodium: 140 mmol/L (ref 135–145)
Total Bilirubin: 0.8 mg/dL (ref 0.3–1.2)
Total Protein: 6.8 g/dL (ref 6.5–8.1)

## 2018-01-14 LAB — LACTATE DEHYDROGENASE: LDH: 172 U/L (ref 98–192)

## 2018-01-15 LAB — BETA 2 MICROGLOBULIN, SERUM: Beta-2 Microglobulin: 1.8 mg/L (ref 0.6–2.4)

## 2018-01-16 DIAGNOSIS — B029 Zoster without complications: Secondary | ICD-10-CM | POA: Diagnosis not present

## 2018-01-20 NOTE — Progress Notes (Signed)
ID: Willie Garcia   DOB: 11-Apr-1946  MR#: [id number]  WUX#:[id number]  OZD:GUYQ, Willie Ready, MD SU: OTHER MD: Willie Garcia, Willie Garcia, Willie Garcia,  Willie Garcia     HISTORY OF PRESENT ILLNESS:  from the original intake note:  Willie Garcia has long had back problems and has been followed for this by Dr. [name].  On November 20, 2005, the patient had an MRI of the lumbar spine without contrast for evaluation of his chronic progressive low back and left buttock pain.  This showed confluent retroperitoneal adenopathy encasing the renal vessels and measuring up to 5.2 x 3.4 cm transversely. There was no epidural mass and the kidneys appeared unremarkable.  This had been compared with an MRI from Nov 09, 2004 where no such adenopathy was noted.    The patient's wife, Willie Garcia, is a Museum/gallery curator at [name], so she brought this to the attention of [name] and a CT Scan of the chest, abdomen and pelvis was obtained there on June 11th.  It was Garcia by [name], showing some small mediastinal lymph nodes, the largest being 1.4 cm, but significant adenopathy surrounding the left renal vein with anterior displacement of that vein as well as the inferior vena cava.  This measured 5.2 cm in transverse diameter and 3.8 cm in AP diameter.  The adenopathy extended inferiorly to the level of the aortic bifurcation.  There were small mesenteric lymph nodes, all less than 12 mm. The spleen was upper normal in size with no focal lesions.  There was no pelvic adenopathy.  His subsequent history is as detailed below  INTERVAL HISTORY: Willie Garcia returns today for follow-up of his low-grade follicular non-Hodgkin's B cell lymphoma, accompanied by his wife, Willie Garcia. He continues under observation.   He developed a shingles recurrence about 2 weeks ago. He felt weak and fatigued. He is concerned about lymphadenopathy in the neck. He currently has a burning sensation and rash on the right neck. The pain extends to his forehead.  His vision in the right eye is blurry. He gained about 10 lbs over the last 2 years. He denies any unexplained weight loss, fever, or drenching sweats.    REVIEW OF SYSTEMS: Willie Garcia's first shingles episode was 30 years ago. He has been staying inside lately and sleeping often. He dreams about working at the fire station. He occasionally visits the fire station. He denies unusual headaches, nausea, vomiting, or dizziness. There has been no unusual cough, phlegm production, or pleurisy. There has been no change in bowel or bladder habits. A detailed review of systems was otherwise stable.    PAST MEDICAL HISTORY: Past Medical History:  Diagnosis Date  . Allergic rhinitis   . Anemia   . Anxiety   . Basal cell carcinoma of skin 2015  . BPH (benign prostatic hyperplasia)   . CAD (coronary artery disease)   . Depression   . Ganglion cyst    left thumb  . GERD (gastroesophageal reflux disease)   . HTN (hypertension)   . Hyperlipidemia   . Non Hodgkin's lymphoma ([place])   . Osteopenia   . Personal history of colonic adenomas 04/04/2010  . Scoliosis   . 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 CARDIAC CATHETERIZATION  07/14/2014   Procedure: LEFT HEART CATH AND CORS/GRAFTS ANGIOGRAPHY;  Surgeon: Jettie Booze, MD;  Location: [facility] CATH LAB;  Service: Cardiovascular;;  .  CARDIAC CATHETERIZATION  07/14/2014   Procedure: INTRAVASCULAR PRESSURE WIRE/FFR STUDY;  Surgeon: Jettie Booze, MD;  Location: [facility] CATH LAB;  Service: Cardiovascular;;  circ  . CERVICAL SPINE SURGERY     C4-C6  . COLONOSCOPY  06/2013   negative  . CORONARY ARTERY BYPASS GRAFT    . HERNIA REPAIR    . RHINOPLASTY    . UPPER GASTROINTESTINAL ENDOSCOPY      FAMILY HISTORY Family History  Problem Relation Age of Onset  . Heart disease Mother   . Heart attack Mother 97  . Heart disease Father   . Heart attack Father 71  . Heart  attack Brother 32  . Diabetes Brother   . Aneurysm Paternal Uncle   . Aneurysm Maternal Grandmother   . Heart attack Maternal Grandmother   . Aneurysm Maternal Grandfather   . Heart attack Maternal Grandfather   . Colon cancer Neg Hx   . Stomach cancer Neg Hx   . Esophageal cancer Neg Hx   . Rectal cancer Neg Hx   The patient's father died at the age of 24 from heart problems.  The patient's mother died at the age of 65 from heart problems.  The patient has three brothers and two sisters.  The only cancer in the family known to the patient is the maternal grandmother, who had breast cancer diagnosed in her fifties or 22.    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  He has been married to Bassett for >30 years.  They have no children of their own.She worked for [name] until August 2018.  They attend [employer]   ADVANCED DIRECTIVES: in place  HEALTH MAINTENANCE: Social History   Tobacco Use  . Smoking status: Never Smoker  . Smokeless tobacco: Never Used  Substance Use Topics  . Alcohol use: No    Alcohol/week: 0.0 oz  . Drug use: No     Colonoscopy: JAN 2015 (with EGD)-- WNL  PSA:  Bone density:  Lipid panel:  No Known Allergies  Current Outpatient Medications  Medication Sig Dispense Refill  . acetaminophen (TYLENOL) 500 MG tablet Take 500 mg by mouth every 6 (six) hours as needed (pain).    Marland Kitchen aspirin 81 MG tablet Take 1 tablet (81 mg total) by mouth daily.    Marland Kitchen CALCIUM-VITAMIN D PO Take 1 tablet by mouth 2 (two) times daily. (600-200)    . Cholecalciferol (VITAMIN D3) 2000 UNITS TABS Take 2,000 Units by mouth daily.    . Cyanocobalamin (VITAMIN B-12 IJ) Inject 1 mL as directed every 30 (thirty) days.     Marland Kitchen dicyclomine (BENTYL) 20 MG tablet Take 1 tablet (20 mg total) by mouth every 6 (six) hours as needed for spasms. 360 tablet 3  . escitalopram (LEXAPRO) 10 MG tablet TAKE 1 TABLET EACH DAY. 90 tablet 0   . ezetimibe (ZETIA) 10 MG tablet Take 10 mg by mouth daily.    . fluticasone (FLONASE) 50 MCG/ACT nasal spray Place 2 sprays into both nostrils daily as needed for allergies or rhinitis.     . folic acid (FOLVITE) 1 MG tablet Take 1 mg by mouth daily.      Marland Kitchen gabapentin (NEURONTIN) 300 MG capsule Take 1 capsule (300 mg total) by mouth at bedtime. 90 capsule 4  . loratadine (CLARITIN) 10 MG tablet Take 10 mg by mouth daily as needed for allergies.    Marland Kitchen metoCLOPramide (REGLAN) 5 MG tablet TAKE 1  TABLET BY MOUTH THREE TIMES DAILY BEFORE MEALS AS NEEDED. 270 tablet 3  . metoprolol succinate (TOPROL-XL) 25 MG 24 hr tablet Take 1 tablet (25 mg total) by mouth daily. 30 tablet 1  . nitroGLYCERIN (NITROSTAT) 0.4 MG SL tablet Place 1 tablet (0.4 mg total) under the tongue every 5 (five) minutes as needed for chest pain. (Patient not taking: Reported on 09/20/2017) 10 tablet 0  . pantoprazole (PROTONIX) 20 MG tablet Take 1 tablet (20 mg total) by mouth daily before breakfast. 90 tablet 3  . rosuvastatin (CRESTOR) 20 MG tablet Take 20 mg by mouth daily.      Marland Kitchen testosterone cypionate (DEPOTESTOTERONE CYPIONATE) 200 MG/ML injection Inject 50 mg into the muscle once a week. AS DIRECTED    . vitamin C (ASCORBIC ACID) 500 MG tablet Take 500 mg by mouth daily.     No current facility-administered medications for this visit.     OBJECTIVE: Middle-aged white man who appears stated age   72:   01/21/18 1308  BP: 128/75  Pulse: (!) 56  Resp: 18  Temp: (!) 97.5 F (36.4 C)  SpO2: 100%     Body mass index is 21.56 kg/m.    ECOG FS: 1  Sclerae unicteric, EOMs intact Oropharynx clear and moist No cervical or supraclavicular adenopathy; mild right submental adenopathy Lungs no rales or rhonchi Heart regular rate and rhythm Abd soft, nontender, positive bowel sounds, no palpable spleen Neuro: nonfocal, well oriented, appropriate affect Skin: Erythematous but not pustular or crusted over lesions over the  upper right front and back chest; there is no extension to the neck or face   LAB RESULTS: Lab Results  Component Value Date   WBC 6.3 01/14/2018   NEUTROABS 4.1 01/14/2018   HGB 12.6 (L) 01/14/2018   HCT 36.8 (L) 01/14/2018   MCV 91.3 01/14/2018   PLT 181 01/14/2018      Chemistry      Component Value Date/Time   NA 140 01/14/2018 1129   NA 140 01/03/2017 1033   K 4.1 01/14/2018 1129   K 4.1 01/03/2017 1033   CL 103 01/14/2018 1129   CL 101 06/30/2012 1325   CO2 29 01/14/2018 1129   CO2 28 01/03/2017 1033   BUN 19 01/14/2018 1129   BUN 17.5 01/03/2017 1033   CREATININE 1.02 01/14/2018 1129   CREATININE 1.0 01/03/2017 1033      Component Value Date/Time   CALCIUM 9.2 01/14/2018 1129   CALCIUM 9.4 01/03/2017 1033   ALKPHOS 83 01/14/2018 1129   ALKPHOS 96 01/03/2017 1033   AST 13 (L) 01/14/2018 1129   AST 13 01/03/2017 1033   ALT 9 01/14/2018 1129   ALT 8 01/03/2017 1033   BILITOT 0.8 01/14/2018 1129   BILITOT 0.92 01/03/2017 1033       No results found for: LABCA2  No components found for: LABCA125  No results for input(s): INR in the last 168 hours.  Urinalysis No results found for: COLORURINE  STUDIES: No results found.   ASSESSMENT: 72 y.o. 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   PLAN:  [place] is now 12 years out from definitive diagnosis of his low-grade follicular non-Hodgkin's lymphoma, with no evidence of disease activity.  This is  very favorable.  Of course his lymphoma is not considered curable.  So long as it is not active it does not require any treatment.  It does require follow-up.  Accordingly he will continue to see me on a yearly basis with labs, which  were reviewed with him today, including the normal LDH and beta-2 microglobulin.  We also reviewed a CT of the abdomen and pelvis obtained at [facility] on 10/17/2017 which showed aortic atherosclerosis and prostatic hyperplasia but no splenomegaly or lymphadenopathy.    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   Adler Alton, [name], MD  01/21/18 1:38 PM Medical Oncology and Hematology [facility] [address] [place], [place] [address] Tel. [phone]    Fax. [phone]  Alice Rieger, am acting as scribe for Chauncey Cruel MD.  I, Lurline Del MD, have reviewed the above documentation for accuracy and completeness, and I agree with the above.

## 2018-01-21 ENCOUNTER — Inpatient Hospital Stay: Payer: Medicare Other | Attending: Oncology | Admitting: Oncology

## 2018-01-21 VITALS — BP 128/75 | HR 56 | Temp 97.5°F | Resp 18 | Ht 69.0 in | Wt 146.0 lb

## 2018-01-21 DIAGNOSIS — D508 Other iron deficiency anemias: Secondary | ICD-10-CM

## 2018-01-21 DIAGNOSIS — G8929 Other chronic pain: Secondary | ICD-10-CM

## 2018-01-21 DIAGNOSIS — I700 Atherosclerosis of aorta: Secondary | ICD-10-CM | POA: Insufficient documentation

## 2018-01-21 DIAGNOSIS — Z8572 Personal history of non-Hodgkin lymphomas: Secondary | ICD-10-CM | POA: Diagnosis not present

## 2018-01-21 DIAGNOSIS — M4100 Infantile idiopathic scoliosis, site unspecified: Secondary | ICD-10-CM

## 2018-01-21 DIAGNOSIS — C8204 Follicular lymphoma grade I, lymph nodes of axilla and upper limb: Secondary | ICD-10-CM

## 2018-01-22 ENCOUNTER — Encounter: Payer: Self-pay | Admitting: Oncology

## 2018-01-22 DIAGNOSIS — G8929 Other chronic pain: Secondary | ICD-10-CM | POA: Insufficient documentation

## 2018-01-22 NOTE — Addendum Note (Signed)
Addended by: Chauncey Cruel on: 01/22/2018 08:28 AM   Modules accepted: Orders

## 2018-01-30 ENCOUNTER — Telehealth: Payer: Self-pay

## 2018-01-30 NOTE — Telephone Encounter (Signed)
Left a vm concerning the upcoming appointment that was already scheduled and mailed a copy of the calender also. Per 8/15 vm return calls

## 2018-02-04 ENCOUNTER — Encounter: Payer: Self-pay | Admitting: Internal Medicine

## 2018-02-10 ENCOUNTER — Other Ambulatory Visit: Payer: Self-pay | Admitting: Oncology

## 2018-02-26 ENCOUNTER — Telehealth: Payer: Self-pay | Admitting: Oncology

## 2018-02-26 ENCOUNTER — Ambulatory Visit (INDEPENDENT_AMBULATORY_CARE_PROVIDER_SITE_OTHER): Payer: Medicare Other | Admitting: Internal Medicine

## 2018-02-26 ENCOUNTER — Encounter: Payer: Self-pay | Admitting: Internal Medicine

## 2018-02-26 VITALS — BP 112/88 | HR 51 | Ht 69.0 in | Wt 143.8 lb

## 2018-02-26 DIAGNOSIS — Z951 Presence of aortocoronary bypass graft: Secondary | ICD-10-CM

## 2018-02-26 DIAGNOSIS — I2510 Atherosclerotic heart disease of native coronary artery without angina pectoris: Secondary | ICD-10-CM | POA: Diagnosis not present

## 2018-02-26 NOTE — Progress Notes (Signed)
HPI [name] returns today for followup of his CAD, s/p MI. The patient underwent CABG over 20 years ago. He did not have much in the way of angina. He has become more sedentary. He gets tired a bit more easily. He denies angina. He has atypical chest pain at times. No edema.  No Known Allergies   Current Outpatient Medications  Medication Sig Dispense Refill  . acetaminophen (TYLENOL) 500 MG tablet Take 500 mg by mouth every 6 (six) hours as needed (pain).    Marland Kitchen aspirin 81 MG tablet Take 1 tablet (81 mg total) by mouth daily.    Marland Kitchen CALCIUM-VITAMIN D PO Take 1 tablet by mouth 2 (two) times daily. (600-200)    . Cholecalciferol (VITAMIN D3) 2000 UNITS TABS Take 2,000 Units by mouth daily.    . Cyanocobalamin (VITAMIN B-12 IJ) Inject 1 mL as directed every 30 (thirty) days.     Marland Kitchen dicyclomine (BENTYL) 20 MG tablet Take 1 tablet (20 mg total) by mouth every 6 (six) hours as needed for spasms. 360 tablet 3  . escitalopram (LEXAPRO) 10 MG tablet TAKE 1 TABLET EACH DAY. 90 tablet 0  . ezetimibe (ZETIA) 10 MG tablet Take 10 mg by mouth daily.    . fluticasone (FLONASE) 50 MCG/ACT nasal spray Place 2 sprays into both nostrils daily as needed for allergies or rhinitis.     . folic acid (FOLVITE) 1 MG tablet Take 1 mg by mouth daily.      Marland Kitchen gabapentin (NEURONTIN) 300 MG capsule Take 1 capsule (300 mg total) by mouth at bedtime. 90 capsule 4  . loratadine (CLARITIN) 10 MG tablet Take 10 mg by mouth daily as needed for allergies.    Marland Kitchen metoCLOPramide (REGLAN) 5 MG tablet TAKE 1 TABLET BY MOUTH THREE TIMES DAILY BEFORE MEALS AS NEEDED. 270 tablet 3  . metoprolol succinate (TOPROL-XL) 25 MG 24 hr tablet Take 1 tablet (25 mg total) by mouth daily. 30 tablet 1  . nitroGLYCERIN (NITROSTAT) 0.4 MG SL tablet Place 1 tablet (0.4 mg total) under the tongue every 5 (five) minutes as needed for chest pain. 10 tablet 0  . pantoprazole (PROTONIX) 20 MG tablet Take 1 tablet (20 mg total) by mouth daily before  breakfast. 90 tablet 3  . rosuvastatin (CRESTOR) 20 MG tablet Take 20 mg by mouth daily.      Marland Kitchen testosterone cypionate (DEPOTESTOTERONE CYPIONATE) 200 MG/ML injection Inject 50 mg into the muscle once a week. AS DIRECTED    . vitamin C (ASCORBIC ACID) 500 MG tablet Take 500 mg by mouth daily.     No current facility-administered medications for this visit.      Past Medical History:  Diagnosis Date  . Allergic rhinitis   . Anemia   . Anxiety   . Basal cell carcinoma of skin 2015  . BPH (benign prostatic hyperplasia)   . CAD (coronary artery disease)   . Depression   . Ganglion cyst    left thumb  . GERD (gastroesophageal reflux disease)   . HTN (hypertension)   . Hyperlipidemia   . Non Hodgkin's lymphoma ([place])   . Osteopenia   . Personal history of colonic adenomas 04/04/2010  . Scoliosis   . 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   Past Surgical History:  Procedure Laterality Date  . CARDIAC CATHETERIZATION  07/14/2014  Procedure: LEFT HEART CATH AND CORS/GRAFTS ANGIOGRAPHY;  Surgeon: Jettie Booze, MD;  Location: [facility] CATH LAB;  Service: Cardiovascular;;  . CARDIAC CATHETERIZATION  07/14/2014   Procedure: INTRAVASCULAR PRESSURE WIRE/FFR STUDY;  Surgeon: Jettie Booze, MD;  Location: [facility] CATH LAB;  Service: Cardiovascular;;  circ  . CERVICAL SPINE SURGERY     C4-C6  . COLONOSCOPY  06/2013   negative  . CORONARY ARTERY BYPASS GRAFT    . HERNIA REPAIR    . RHINOPLASTY    . UPPER GASTROINTESTINAL ENDOSCOPY       Family History  Problem Relation Age of Onset  . Heart disease Mother   . Heart attack Mother 54  . Heart disease Father   . Heart attack Father 32  . Heart attack Brother 33  . Diabetes Brother   . Aneurysm Paternal Uncle   . Aneurysm Maternal Grandmother   . Heart attack Maternal Grandmother   . Aneurysm Maternal Grandfather   . Heart attack Maternal Grandfather    . Colon cancer Neg Hx   . Stomach cancer Neg Hx   . Esophageal cancer Neg Hx   . Rectal cancer Neg Hx      Social History   Socioeconomic History  . Marital status: Married    Spouse name: Not on file  . Number of children: Not on file  . Years of education: Not on file  . Highest education level: Not on file  Occupational History  . Occupation: Retired  Scientific laboratory technician  . Financial resource strain: Not on file  . Food insecurity:    Worry: Not on file    Inability: Not on file  . Transportation needs:    Medical: Not on file    Non-medical: Not on file  Tobacco Use  . Smoking status: Never Smoker  . Smokeless tobacco: Never Used  Substance and Sexual Activity  . Alcohol use: No    Alcohol/week: 0.0 standard drinks  . Drug use: No  . Sexual activity: Not on file  Lifestyle  . Physical activity:    Days per week: Not on file    Minutes per session: Not on file  . Stress: Not on file  Relationships  . Social connections:    Talks on phone: Not on file    Gets together: Not on file    Attends religious service: Not on file    Active member of club or organization: Not on file    Attends meetings of clubs or organizations: Not on file    Relationship status: Not on file  . Intimate partner violence:    Fear of current or ex partner: Not on file    Emotionally abused: Not on file    Physically abused: Not on file    Forced sexual activity: Not on file  Other Topics Concern  . Not on file  Social History Narrative   Married, wife is a Marine scientist, no children   Retired Microbiologist, flies as a hobby   2 caffeinated beverages daily     BP 112/88   Pulse (!) 51   Ht 5\' 9"  (1.753 m)   Wt 143 lb 12.8 oz (65.2 kg)   SpO2 98%   BMI 21.24 kg/m   Physical Exam:  Well appearing 72 yo man, NAD HEENT: Unremarkable Neck:  No JVD, no thyromegally Lymphatics:  No adenopathy Back:  No CVA tenderness Lungs:  Clear with no wheezes HEART:  Regular rate rhythm, no  murmurs, no rubs,  no clicks Abd:  soft, positive bowel sounds, no organomegally, no rebound, no guarding Ext:  2 plus pulses, no edema, no cyanosis, no clubbing Skin:  No rashes no nodules Neuro:  CN II through XII intact, motor grossly intact  EKG - sinus bradycardia   Assess/Plan: 1. CAD - he is s/p CABG 20years ago. He has never had a cath and has gone over 10 years since his last stress test. At this point because of some exertional fatigue, I would suggest he undergo exercise myoview stress test. 2. Dyslipidemia - he will continue his crestor and zetia.  3. HTN - his blood pressure is well controlled. No change in meds.  Mikle Bosworth.D.

## 2018-02-26 NOTE — Telephone Encounter (Signed)
Faxed medical records to [place], Release ID: [id number]

## 2018-02-26 NOTE — Patient Instructions (Addendum)
Medication Instructions:  Your physician recommends that you continue on your current medications as directed. Please refer to the Current Medication list given to you today.  Labwork: None ordered.  Testing/Procedures: Your physician has requested that you have en exercise stress myoview. For further information please visit [email or web address]. Please follow instruction sheet, as given.  Please schedule for an exercise myoview.  Follow-Up: Your physician wants you to follow-up in: one year with Dr. Lovena Le.   You will receive a reminder letter in the mail two months in advance. If you don't receive a letter, please call our office to schedule the follow-up appointment.  Any Other Special Instructions Will Be Listed Below (If Applicable).  If you need a refill on your cardiac medications before your next appointment, please call your pharmacy.

## 2018-03-11 DIAGNOSIS — M47816 Spondylosis without myelopathy or radiculopathy, lumbar region: Secondary | ICD-10-CM | POA: Diagnosis not present

## 2018-03-11 DIAGNOSIS — M4126 Other idiopathic scoliosis, lumbar region: Secondary | ICD-10-CM | POA: Diagnosis not present

## 2018-03-15 DIAGNOSIS — Z23 Encounter for immunization: Secondary | ICD-10-CM | POA: Diagnosis not present

## 2018-03-16 ENCOUNTER — Encounter: Payer: Self-pay | Admitting: Oncology

## 2018-03-16 ENCOUNTER — Other Ambulatory Visit: Payer: Self-pay | Admitting: Oncology

## 2018-04-03 ENCOUNTER — Telehealth (HOSPITAL_COMMUNITY): Payer: Self-pay | Admitting: *Deleted

## 2018-04-03 NOTE — Telephone Encounter (Signed)
Patient's wife, per, dpr, given detailed instructions per Myocardial Perfusion Study Information Sheet for the test on 04/07/18. Patient notified to arrive 15 minutes early and that it is imperative to arrive on time for appointment to keep from having the test rescheduled.  If you need to cancel or reschedule your appointment, please call the office within 24 hours of your appointment. . Patient verbalized understanding. Kirstie Peri

## 2018-04-07 ENCOUNTER — Ambulatory Visit (HOSPITAL_COMMUNITY): Payer: Medicare Other | Attending: Cardiovascular Disease

## 2018-04-07 DIAGNOSIS — I2510 Atherosclerotic heart disease of native coronary artery without angina pectoris: Secondary | ICD-10-CM | POA: Insufficient documentation

## 2018-04-07 DIAGNOSIS — Z951 Presence of aortocoronary bypass graft: Secondary | ICD-10-CM | POA: Insufficient documentation

## 2018-04-07 LAB — MYOCARDIAL PERFUSION IMAGING
Estimated workload: 7 METS
Exercise duration (min): 6 min
LV dias vol: 89 mL (ref 62–150)
LV sys vol: 39 mL
MPHR: 148 {beats}/min
Peak HR: 129 {beats}/min
Percent HR: 87 %
RPE: 17
Rest HR: 62 {beats}/min
SDS: 2
SRS: 6
SSS: 8
TID: 0.99

## 2018-04-07 MED ORDER — TECHNETIUM TC 99M TETROFOSMIN IV KIT
32.3000 | PACK | Freq: Once | INTRAVENOUS | Status: AC | PRN
  Administered 2018-04-07: 32.3 via INTRAVENOUS
  Filled 2018-04-07: qty 33

## 2018-04-07 MED ORDER — TECHNETIUM TC 99M TETROFOSMIN IV KIT
10.3000 | PACK | Freq: Once | INTRAVENOUS | Status: AC | PRN
  Administered 2018-04-07: 10.3 via INTRAVENOUS
  Filled 2018-04-07: qty 11

## 2018-04-14 DIAGNOSIS — E291 Testicular hypofunction: Secondary | ICD-10-CM | POA: Diagnosis not present

## 2018-04-17 DIAGNOSIS — M47816 Spondylosis without myelopathy or radiculopathy, lumbar region: Secondary | ICD-10-CM | POA: Diagnosis not present

## 2018-04-21 DIAGNOSIS — N5201 Erectile dysfunction due to arterial insufficiency: Secondary | ICD-10-CM | POA: Diagnosis not present

## 2018-04-21 DIAGNOSIS — R351 Nocturia: Secondary | ICD-10-CM | POA: Diagnosis not present

## 2018-04-21 DIAGNOSIS — N401 Enlarged prostate with lower urinary tract symptoms: Secondary | ICD-10-CM | POA: Diagnosis not present

## 2018-04-21 DIAGNOSIS — E291 Testicular hypofunction: Secondary | ICD-10-CM | POA: Diagnosis not present

## 2018-05-01 DIAGNOSIS — H5310 Unspecified subjective visual disturbances: Secondary | ICD-10-CM | POA: Diagnosis not present

## 2018-05-01 DIAGNOSIS — H52203 Unspecified astigmatism, bilateral: Secondary | ICD-10-CM | POA: Diagnosis not present

## 2018-05-01 DIAGNOSIS — H25813 Combined forms of age-related cataract, bilateral: Secondary | ICD-10-CM | POA: Diagnosis not present

## 2018-05-08 DIAGNOSIS — J321 Chronic frontal sinusitis: Secondary | ICD-10-CM | POA: Diagnosis not present

## 2018-05-08 DIAGNOSIS — M47816 Spondylosis without myelopathy or radiculopathy, lumbar region: Secondary | ICD-10-CM | POA: Diagnosis not present

## 2018-05-08 DIAGNOSIS — I10 Essential (primary) hypertension: Secondary | ICD-10-CM | POA: Diagnosis not present

## 2018-05-08 DIAGNOSIS — J302 Other seasonal allergic rhinitis: Secondary | ICD-10-CM | POA: Diagnosis not present

## 2018-05-08 DIAGNOSIS — R05 Cough: Secondary | ICD-10-CM | POA: Diagnosis not present

## 2018-05-08 DIAGNOSIS — Z6821 Body mass index (BMI) 21.0-21.9, adult: Secondary | ICD-10-CM | POA: Diagnosis not present

## 2018-05-08 DIAGNOSIS — C8590 Non-Hodgkin lymphoma, unspecified, unspecified site: Secondary | ICD-10-CM | POA: Diagnosis not present

## 2018-05-12 ENCOUNTER — Ambulatory Visit (INDEPENDENT_AMBULATORY_CARE_PROVIDER_SITE_OTHER): Payer: Medicare Other | Admitting: Orthopaedic Surgery

## 2018-05-14 DIAGNOSIS — I10 Essential (primary) hypertension: Secondary | ICD-10-CM | POA: Diagnosis not present

## 2018-05-14 DIAGNOSIS — R05 Cough: Secondary | ICD-10-CM | POA: Diagnosis not present

## 2018-05-14 DIAGNOSIS — J302 Other seasonal allergic rhinitis: Secondary | ICD-10-CM | POA: Diagnosis not present

## 2018-05-27 ENCOUNTER — Telehealth: Payer: Self-pay | Admitting: *Deleted

## 2018-05-27 NOTE — Telephone Encounter (Signed)
VM left by pt's wife stating concern due to ongoing cold symptoms- " we have both had colds but Rio's is ongoing for 3 weeks. We went to our primary MD and Cordera was on antibiotics and steroids and is starting to feel better "  [name] states Shemuel " has had some sweats but also cold chills and I don't know if that is a concern "  [name] gave return call number as [phone].  This RN returned call and discussed above. Barnes is currently on his second round of antibiotic - Leviquin and cough medication with codeine.  He is not running fevers but does have occassional sweating and then chills- which can occur at any time of the day.  [name] stated " I just never know when to not worry ".  This RN reviewed last lab was July of this year.   Currently best to complete the antibiotic, monitor symptoms and [name] is to call next week- if symptoms persist additional labs can be done post completing antibiotic.  [name] verbalized understanding and appreciation of plan.  No further needs at this time.

## 2018-05-28 ENCOUNTER — Telehealth: Payer: Self-pay

## 2018-05-28 NOTE — Telephone Encounter (Signed)
VM left by patient's wife regarding completion of antibiotic being Saturday and seeing what day he will need to come in for labs.  Nurse reviewed previous TC from yesterday.  Returned call, left voicemail that labs only need to be done if symptoms persist.  Patient still currently on antibiotic regiment.  Contact information left for return call.

## 2018-06-03 ENCOUNTER — Telehealth: Payer: Self-pay | Admitting: Oncology

## 2018-06-03 NOTE — Telephone Encounter (Signed)
Scheduled appt per 12/16 sch message - left message for patient with appt date and time  

## 2018-06-06 ENCOUNTER — Inpatient Hospital Stay: Payer: Medicare Other | Attending: Oncology

## 2018-06-06 ENCOUNTER — Other Ambulatory Visit: Payer: Self-pay | Admitting: *Deleted

## 2018-06-06 DIAGNOSIS — Z8572 Personal history of non-Hodgkin lymphomas: Secondary | ICD-10-CM | POA: Insufficient documentation

## 2018-06-06 DIAGNOSIS — C8204 Follicular lymphoma grade I, lymph nodes of axilla and upper limb: Secondary | ICD-10-CM

## 2018-06-06 DIAGNOSIS — Z79899 Other long term (current) drug therapy: Secondary | ICD-10-CM | POA: Insufficient documentation

## 2018-06-06 DIAGNOSIS — D508 Other iron deficiency anemias: Secondary | ICD-10-CM

## 2018-06-06 LAB — CMP (CANCER CENTER ONLY)
ALT: 14 U/L (ref 0–44)
AST: 14 U/L — ABNORMAL LOW (ref 15–41)
Albumin: 3.7 g/dL (ref 3.5–5.0)
Alkaline Phosphatase: 103 U/L (ref 38–126)
Anion gap: 9 (ref 5–15)
BUN: 10 mg/dL (ref 8–23)
CO2: 30 mmol/L (ref 22–32)
Calcium: 9.4 mg/dL (ref 8.9–10.3)
Chloride: 101 mmol/L (ref 98–111)
Creatinine: 1 mg/dL (ref 0.61–1.24)
GFR, Est AFR Am: >60 mL/min (ref >60)
GFR, Estimated: >60 mL/min (ref >60)
Glucose, Bld: 83 mg/dL (ref 70–99)
Potassium: 4.6 mmol/L (ref 3.5–5.1)
Sodium: 140 mmol/L (ref 135–145)
Total Bilirubin: 0.9 mg/dL (ref 0.3–1.2)
Total Protein: 6.9 g/dL (ref 6.5–8.1)

## 2018-06-06 LAB — CBC WITH DIFFERENTIAL (CANCER CENTER ONLY)
Abs Immature Granulocytes: 0.03 10*3/uL (ref 0.00–0.07)
Basophils Absolute: 0 10*3/uL (ref 0.0–0.1)
Basophils Relative: 1 %
Eosinophils Absolute: 0.4 10*3/uL (ref 0.0–0.5)
Eosinophils Relative: 7 %
HCT: 39.8 % (ref 39.0–52.0)
Hemoglobin: 13.1 g/dL (ref 13.0–17.0)
Immature Granulocytes: 1 %
Lymphocytes Relative: 15 %
Lymphs Abs: 1 10*3/uL (ref 0.7–4.0)
MCH: 31.4 pg (ref 26.0–34.0)
MCHC: 32.9 g/dL (ref 30.0–36.0)
MCV: 95.4 fL (ref 80.0–100.0)
Monocytes Absolute: 0.5 10*3/uL (ref 0.1–1.0)
Monocytes Relative: 7 %
Neutro Abs: 4.7 10*3/uL (ref 1.7–7.7)
Neutrophils Relative %: 69 %
Platelet Count: 204 10*3/uL (ref 150–400)
RBC: 4.17 MIL/uL — ABNORMAL LOW (ref 4.22–5.81)
RDW: 13.2 % (ref 11.5–15.5)
WBC Count: 6.6 10*3/uL (ref 4.0–10.5)
nRBC: 0 % (ref 0.0–0.2)

## 2018-06-06 LAB — LACTATE DEHYDROGENASE: LDH: 164 U/L (ref 98–192)

## 2018-06-07 LAB — BETA 2 MICROGLOBULIN, SERUM: Beta-2 Microglobulin: 1.9 mg/L (ref 0.6–2.4)

## 2018-06-09 LAB — FERRITIN: Ferritin: 158 ng/mL (ref 24–336)

## 2018-06-20 ENCOUNTER — Telehealth: Payer: Self-pay

## 2018-06-20 NOTE — Telephone Encounter (Signed)
Called pt to notify him of his lab results. Confirmed follow up appts as well.

## 2018-06-20 NOTE — Telephone Encounter (Signed)
Pt called to see what his lab results were. Gave pt lab results. Would like to have lab results to be mailed at home. Will send it out today per pt request.

## 2018-06-22 ENCOUNTER — Other Ambulatory Visit: Payer: Self-pay | Admitting: Oncology

## 2018-07-08 DIAGNOSIS — M47816 Spondylosis without myelopathy or radiculopathy, lumbar region: Secondary | ICD-10-CM | POA: Diagnosis not present

## 2018-08-11 DIAGNOSIS — M47816 Spondylosis without myelopathy or radiculopathy, lumbar region: Secondary | ICD-10-CM | POA: Diagnosis not present

## 2018-08-22 ENCOUNTER — Other Ambulatory Visit: Payer: Self-pay | Admitting: Internal Medicine

## 2018-08-22 DIAGNOSIS — I10 Essential (primary) hypertension: Secondary | ICD-10-CM

## 2018-08-25 DIAGNOSIS — L570 Actinic keratosis: Secondary | ICD-10-CM | POA: Diagnosis not present

## 2018-08-25 DIAGNOSIS — D1801 Hemangioma of skin and subcutaneous tissue: Secondary | ICD-10-CM | POA: Diagnosis not present

## 2018-08-25 DIAGNOSIS — Z85828 Personal history of other malignant neoplasm of skin: Secondary | ICD-10-CM | POA: Diagnosis not present

## 2018-08-27 ENCOUNTER — Ambulatory Visit: Payer: Medicare Other | Admitting: Internal Medicine

## 2018-08-29 ENCOUNTER — Other Ambulatory Visit: Payer: Self-pay

## 2018-08-29 ENCOUNTER — Ambulatory Visit (INDEPENDENT_AMBULATORY_CARE_PROVIDER_SITE_OTHER): Payer: Medicare Other | Admitting: Orthopaedic Surgery

## 2018-08-29 ENCOUNTER — Ambulatory Visit (INDEPENDENT_AMBULATORY_CARE_PROVIDER_SITE_OTHER): Payer: Medicare Other

## 2018-08-29 ENCOUNTER — Encounter (INDEPENDENT_AMBULATORY_CARE_PROVIDER_SITE_OTHER): Payer: Self-pay | Admitting: Orthopaedic Surgery

## 2018-08-29 VITALS — BP 112/70 | HR 60 | Ht 70.0 in | Wt 142.0 lb

## 2018-08-29 DIAGNOSIS — M25552 Pain in left hip: Secondary | ICD-10-CM

## 2018-08-29 DIAGNOSIS — M4186 Other forms of scoliosis, lumbar region: Secondary | ICD-10-CM | POA: Diagnosis not present

## 2018-08-29 DIAGNOSIS — I2510 Atherosclerotic heart disease of native coronary artery without angina pectoris: Secondary | ICD-10-CM

## 2018-08-29 DIAGNOSIS — I119 Hypertensive heart disease without heart failure: Secondary | ICD-10-CM

## 2018-08-29 DIAGNOSIS — M79675 Pain in left toe(s): Secondary | ICD-10-CM

## 2018-08-29 DIAGNOSIS — M47817 Spondylosis without myelopathy or radiculopathy, lumbosacral region: Secondary | ICD-10-CM

## 2018-08-29 NOTE — Progress Notes (Signed)
Office Visit Note   Patient: Willie Garcia           Date of Birth: 03-09-1946           MRN: [id number] Visit Date: 08/29/2018              Requested by: Prince Solian, MD [address] [place], [place] [address] PCP: Prince Solian, MD   Assessment & Plan: Visit Diagnoses:  1. Pain in left hip   2. Pain of left great toe     Plan: Mr. Motyka for evaluation of 2 problems.  He has had some chronic pain in the left parasacral region.  By films today there is no evidence of any hip pathology.  Does have a chronic problem with his lumbar spine being followed by the neurosurgeons.  I believe that pain is referred from his back.  He also recently had what appears to been a paronychia of the left great toe.  There was purulence that he was able to "express".  He then soaked his foot in Epson salts.  There is no evidence of present infection but he does have an overlapping second toe on the great toe.  Also has what may be an early problem with cross over of the fourth and third toes.  I have pared the nails to prevent any infection and then applied a toe spacer and hammertoe pad for his foot.  No evidence of active infection  Follow-Up Instructions: Return if symptoms worsen or fail to improve.   Orders:  Orders Placed This Encounter  Procedures  . XR Pelvis 1-2 Views  . XR Toe Great Left   No orders of the defined types were placed in this encounter.     Procedures: No procedures performed   Clinical Data: No additional findings.   Subjective: Chief Complaint  Patient presents with  . Left Hip - Pain  Patient presents today for left hip pain X months. He said that he hurts on the lateral side. No pain radiates down his leg. No numbness or tingling down his left leg, but occasionally a burning sensation. He has been going to Dr.Harkins for back pain. The hip pain is worse with standing.  Pain is actually  posteriorly in the parasacral region to the left.  No groin pain.   No pain directly over the greater trochanter Patient is also here today for his left great toe. He said that it was painful, red, and swollen a week ago. He said that it was draining under his toenail. His toe feels better now, but still red.  He was able to "express" pus and then soak his foot and salt water.  No pain presently or redness  HPI  Review of Systems   Objective: Vital Signs: BP 112/70   Pulse 60   Ht 5\' 10"  (1.778 m)   Wt 142 lb (64.4 kg)   BMI 20.37 kg/m   Physical Exam Constitutional:      Appearance: She is well-developed.  Eyes:     Pupils: Pupils are equal, round, and reactive to light.  Pulmonary:     Effort: Pulmonary effort is normal.  Skin:    General: Skin is warm and dry.  Neurological:     Mental Status: She is alert and oriented to person, place, and time.  Psychiatric:        Behavior: Behavior normal.     Ortho Exam awake alert and oriented x3.  Comfortable sitting.  No pain  with range of motion of left hip with internal and external rotation.  Straight leg raise negative.  Does have some very mild tenderness in the left parasacral region but no skin changes.  No pain over the greater trochanter.  Left great toe without evidence of active infection no erythema or purulence.  Second toe crosses over the first with hammertoe appearance.  Also has crossover of the fourth on the third toe.  Good capillary refill and sensory exam intact  Specialty Comments:  No specialty comments available.  Imaging: Xr Toe Great Left  Result Date: 08/29/2018 Films of the left great toe were obtained in several projections.  Patient has had a recent paronychia but no evidence of osteomyelitis of the distal phalanx..  No acute changes  Xr Pelvis 1-2 Views  Result Date: 08/29/2018 AP the pelvis did not demonstrate any acute change about either hip.  The joint spaces appear to be well-maintained without evidence of obvious osteoarthritis particularly in the left  hip.  Fovea centralis appears normal.  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  Carotic lesion that could be the enchondroma it appears to be small and certainly not aggressive.  Patient is not symptomatic on the right.  There is very minimal osteophyte formation periphery of the but the joint space appear to be well-maintained.  There is significant degenerative changes at L5-S1 on the limited view of that area    PMFS History: Patient Active Problem List   Diagnosis Date Noted  . Pain in left hip 08/29/2018  . Pain of left great toe 08/29/2018  . Chronic pain 01/22/2018  . Aortic atherosclerosis ([place]) 01/21/2018  . Chest pain at rest   . Abnormal nuclear stress test 07/14/2014  . Chest pain with high risk for cardiac etiology - Atypical Pain, but abnormal Myoview. 07/13/2014  . Hyperlipidemia 07/13/2014  . Iron deficiency anemia, unspecified 05/19/2013  . B12 deficiency 05/19/2013  . Dyspepsia 05/19/2013  . Scoliosis   . BPH (benign prostatic hyperplasia)   . 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 Abdominal bruit 10/09/2010  . Personal history of colonic adenomas 04/04/2010  . Coronary atherosclerosis 01/18/2009  . DYSLIPIDEMIA 10/02/2008  . Essential hypertension 10/02/2008   Past Medical History:  Diagnosis Date  . Allergic rhinitis   . Anemia   . Anxiety   . Basal cell carcinoma of skin 2015  . BPH (benign prostatic hyperplasia)   . CAD (coronary artery disease)   . Depression   . Ganglion cyst    left thumb  . GERD (gastroesophageal reflux disease)   . HTN (hypertension)   . Hyperlipidemia   . Non Hodgkin's lymphoma ([place])   . Osteopenia   . Personal history of colonic adenomas 04/04/2010  . Scoliosis   . 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 Heart disease Mother   . Heart attack Mother 77  . Heart disease Father   . Heart attack Father 48  . Heart  attack Brother 61  . Diabetes Brother   . Aneurysm Paternal Uncle   . Aneurysm Maternal Grandmother   . Heart attack Maternal Grandmother   . Aneurysm Maternal Grandfather   . Heart attack Maternal Grandfather   . Colon cancer Neg Hx   . Stomach cancer Neg Hx   . Esophageal cancer Neg Hx   . Rectal cancer Neg Hx  Past Surgical History:  Procedure Laterality Date  . CARDIAC CATHETERIZATION  07/14/2014   Procedure: LEFT HEART CATH AND CORS/GRAFTS ANGIOGRAPHY;  Surgeon: Jettie Booze, MD;  Location: [facility] CATH LAB;  Service: Cardiovascular;;  . CARDIAC CATHETERIZATION  07/14/2014   Procedure: INTRAVASCULAR PRESSURE WIRE/FFR STUDY;  Surgeon: Jettie Booze, MD;  Location: [facility] CATH LAB;  Service: Cardiovascular;;  circ  . CERVICAL SPINE SURGERY     C4-C6  . COLONOSCOPY  06/2013   negative  . CORONARY ARTERY BYPASS GRAFT    . HERNIA REPAIR    . RHINOPLASTY    . UPPER GASTROINTESTINAL ENDOSCOPY     Social History   Occupational History  . Occupation: Retired  Tobacco Use  . Smoking status: Never Smoker  . Smokeless tobacco: Never Used  Substance and Sexual Activity  . Alcohol use: No    Alcohol/week: 0.0 standard drinks  . Drug use: No  . Sexual activity: Not on file

## 2018-09-22 ENCOUNTER — Ambulatory Visit: Payer: Medicare Other | Admitting: Internal Medicine

## 2018-10-20 DIAGNOSIS — E291 Testicular hypofunction: Secondary | ICD-10-CM | POA: Diagnosis not present

## 2018-10-20 DIAGNOSIS — N401 Enlarged prostate with lower urinary tract symptoms: Secondary | ICD-10-CM | POA: Diagnosis not present

## 2018-10-23 ENCOUNTER — Ambulatory Visit: Payer: Medicare Other | Admitting: Internal Medicine

## 2018-11-03 ENCOUNTER — Telehealth: Payer: Self-pay | Admitting: General Surgery

## 2018-11-03 NOTE — Telephone Encounter (Signed)
Called patient and LVM on home and mobile number to contact the office to pre-screen for virtual visit on 11/04/2018.

## 2018-11-04 ENCOUNTER — Other Ambulatory Visit: Payer: Self-pay

## 2018-11-04 ENCOUNTER — Encounter: Payer: Self-pay | Admitting: Internal Medicine

## 2018-11-04 ENCOUNTER — Ambulatory Visit (INDEPENDENT_AMBULATORY_CARE_PROVIDER_SITE_OTHER): Payer: Medicare Other | Admitting: Internal Medicine

## 2018-11-04 ENCOUNTER — Telehealth: Payer: Self-pay | Admitting: *Deleted

## 2018-11-04 DIAGNOSIS — R1013 Epigastric pain: Secondary | ICD-10-CM | POA: Diagnosis not present

## 2018-11-04 DIAGNOSIS — Z860100 Personal history of colon polyps, unspecified: Secondary | ICD-10-CM

## 2018-11-04 DIAGNOSIS — L723 Sebaceous cyst: Secondary | ICD-10-CM | POA: Insufficient documentation

## 2018-11-04 NOTE — Assessment & Plan Note (Signed)
Seems stable - continue PPI

## 2018-11-04 NOTE — Telephone Encounter (Signed)
Attempted to reach pt to go over covid screening questions.  LMOM and will try to reach tomorrow

## 2018-11-04 NOTE — Assessment & Plan Note (Signed)
Will see if I can treat at colonoscopy with manual pressure

## 2018-11-04 NOTE — Patient Instructions (Signed)
You have been scheduled for a colonoscopy. Please follow written instructions given to you at your visit today.  Please pick up your prep supplies at the pharmacy within the next 1-3 days. If you use inhalers (even only as needed), please bring them with you on the day of your procedure.   I appreciate the opportunity to care for you. Carl Gessner, MD, FACG 

## 2018-11-04 NOTE — Progress Notes (Signed)
Willie Garcia 73 y.o. Nov 01, 1945 [id number]  Assessment & Plan:   Personal history of colonic adenomas Repeat colonoscopy The risks and benefits as well as alternatives of endoscopic procedure(s) have been discussed and reviewed. All questions answered. The patient agrees to proceed.   Dyspepsia Seems stable - continue PPI  Sebaceous cyst - perianal Will see if I can treat at colonoscopy with manual pressure     Subjective:   Chief Complaint: History of colon polyps question of some dysphagia  HPI  Willie Garcia presents with his wife today, he has had some rare dysphagia but none in a long time.  More that he had a coating on his tongue and a sore throat a while back and no real esophageal dysphagia I think.  His weight is up a bit.  His wife remains concerned about potential bad problems.  He is due for a colonoscopy he had 3 adenomas 5 years later in 2015 none and I recommended a repeat in this year.  They are both inclined to proceed with that.  Other than that it lately he is only been having occasional indigestion.  Takes his pantoprazole most of the time but not always.  Last EGD in 2015 I did do a dilation but it was negative.  No findings.  CBC and CMET in December at oncology follow-up for history of lymphoma look fine.  All normal.  He does complain of a painful protruding lesion in the anal area and said that he saw Dr. Marcello Moores for that sometime in the last year or so and she "squeezed it open". Wt Readings from Last 3 Encounters:  11/04/18 150 lb (68 kg)  08/29/18 142 lb (64.4 kg)  04/07/18 143 lb (64.9 kg)    No Known Allergies Current Meds  Medication Sig  . acetaminophen (TYLENOL) 500 MG tablet Take 500 mg by mouth every 6 (six) hours as needed (pain).  Marland Kitchen aspirin 81 MG tablet Take 1 tablet (81 mg total) by mouth daily.  Marland Kitchen CALCIUM-VITAMIN D PO Take 1 tablet by mouth 2 (two) times daily. (600-200)  . Cholecalciferol (VITAMIN D3) 2000 UNITS TABS Take 2,000 Units by  mouth daily.  . Cyanocobalamin (VITAMIN B-12 IJ) Inject 1 mL as directed every 30 (thirty) days.   Marland Kitchen dicyclomine (BENTYL) 20 MG tablet Take 1 tablet (20 mg total) by mouth every 6 (six) hours as needed for spasms.  Marland Kitchen ezetimibe (ZETIA) 10 MG tablet Take 10 mg by mouth daily.  . fluticasone (FLONASE) 50 MCG/ACT nasal spray Place 2 sprays into both nostrils daily as needed for allergies or rhinitis.   . folic acid (FOLVITE) 1 MG tablet Take 1 mg by mouth daily.    Marland Kitchen gabapentin (NEURONTIN) 300 MG capsule Take 1 capsule (300 mg total) by mouth at bedtime. (Patient taking differently: Take 300 mg by mouth at bedtime. Takes when his feet burn)  . loratadine (CLARITIN) 10 MG tablet Take 10 mg by mouth daily as needed for allergies.  Marland Kitchen metoCLOPramide (REGLAN) 5 MG tablet TAKE 1 TABLET BY MOUTH THREE TIMES DAILY BEFORE MEALS AS NEEDED.  . metoprolol succinate (TOPROL-XL) 25 MG 24 hr tablet TAKE 1 TABLET ONCE DAILY.  . nitroGLYCERIN (NITROSTAT) 0.4 MG SL tablet Place 1 tablet (0.4 mg total) under the tongue every 5 (five) minutes as needed for chest pain.  . pantoprazole (PROTONIX) 20 MG tablet Take 1 tablet (20 mg total) by mouth daily before breakfast.  . rosuvastatin (CRESTOR) 20 MG tablet Take 20 mg  by mouth daily.    Marland Kitchen testosterone cypionate (DEPOTESTOTERONE CYPIONATE) 200 MG/ML injection Inject 50 mg into the muscle once a week. AS DIRECTED  . vitamin C (ASCORBIC ACID) 500 MG tablet Take 500 mg by mouth daily.   Past Medical History:  Diagnosis Date  . Allergic rhinitis   . Anemia   . Anxiety   . Basal cell carcinoma of skin 2015  . BPH (benign prostatic hyperplasia)   . CAD (coronary artery disease)   . Depression   . Ganglion cyst    left thumb  . GERD (gastroesophageal reflux disease)   . HTN (hypertension)   . Hyperlipidemia   . Non Hodgkin's lymphoma ([place])   . Osteopenia   . Personal history of colonic adenomas 04/04/2010  . Scoliosis   . 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 CARDIAC CATHETERIZATION  07/14/2014   Procedure: LEFT HEART CATH AND CORS/GRAFTS ANGIOGRAPHY;  Surgeon: Jettie Booze, MD;  Location: [facility] CATH LAB;  Service: Cardiovascular;;  . CARDIAC CATHETERIZATION  07/14/2014   Procedure: INTRAVASCULAR PRESSURE WIRE/FFR STUDY;  Surgeon: Jettie Booze, MD;  Location: [facility] CATH LAB;  Service: Cardiovascular;;  circ  . CERVICAL SPINE SURGERY     C4-C6  . COLONOSCOPY  06/2013   negative  . CORONARY ARTERY BYPASS GRAFT    . HERNIA REPAIR    . RHINOPLASTY    . UPPER GASTROINTESTINAL ENDOSCOPY     Social History   Social History Narrative   Married, wife is a Marine scientist, no children   Retired Microbiologist, flies as a hobby   2 caffeinated beverages daily   family history includes Aneurysm in her maternal grandfather, maternal grandmother, and paternal uncle; Diabetes in her brother; Heart attack in her maternal grandfather and maternal grandmother; Heart attack (age of onset: 66) in her brother; Heart attack (age of onset: 54) in her mother; Heart attack (age of onset: 12) in her father; Heart disease in her father and mother.   Review of Systems As per HPI  Objective:   Physical Exam @BP  118/60   Pulse (!) 57   Temp 97.6 F (36.4 C)   Ht 5\' 10"  (1.778 m)   Wt 150 lb (68 kg)   BMI 21.52 kg/m @  General:  NAD Eyes:   Anicteric Oropharynx - NL Lungs:  clear Heart::  S1S2 no rubs, murmurs or gallops Abdomen:  soft and nontender, BS+ Rectal  Small sebaceous cyst right perianal area    Data Reviewed:  See HPI

## 2018-11-04 NOTE — Assessment & Plan Note (Signed)
Repeat colonoscopy The risks and benefits as well as alternatives of endoscopic procedure(s) have been discussed and reviewed. All questions answered. The patient agrees to proceed.  

## 2018-11-05 ENCOUNTER — Telehealth: Payer: Self-pay | Admitting: *Deleted

## 2018-11-05 NOTE — Telephone Encounter (Signed)
Covid-19 travel screening questions  Have you traveled in the last 14 days? No If yes where?  Do you now or have you had a fever in the last 14 days? No  Do you have any respiratory symptoms of shortness of breath or cough now or in the last 14 days? No  Do you have any family members or close contacts with diagnosed or suspected Covid-19? No  Pt is aware care partner needs to wait in car and will a wear a mask into the building. Pt does not want to move appointment time.

## 2018-11-06 ENCOUNTER — Other Ambulatory Visit: Payer: Self-pay

## 2018-11-06 ENCOUNTER — Encounter: Payer: Self-pay | Admitting: Internal Medicine

## 2018-11-06 ENCOUNTER — Ambulatory Visit (AMBULATORY_SURGERY_CENTER): Payer: Medicare Other | Admitting: Internal Medicine

## 2018-11-06 VITALS — BP 112/71 | HR 54 | Temp 98.9°F | Resp 11 | Ht 70.0 in | Wt 150.0 lb

## 2018-11-06 DIAGNOSIS — D122 Benign neoplasm of ascending colon: Secondary | ICD-10-CM

## 2018-11-06 DIAGNOSIS — D125 Benign neoplasm of sigmoid colon: Secondary | ICD-10-CM

## 2018-11-06 DIAGNOSIS — Z860100 Personal history of colon polyps, unspecified: Secondary | ICD-10-CM

## 2018-11-06 MED ORDER — SODIUM CHLORIDE 0.9 % IV SOLN: 500.0000 mL | Freq: Once | INTRAVENOUS | Status: DC

## 2018-11-06 NOTE — Progress Notes (Signed)
To PACU, VSS. Report to [email or web address] 

## 2018-11-06 NOTE — Progress Notes (Signed)
Covid screening and temp by CW. Vitals by Rica Mote.

## 2018-11-06 NOTE — Progress Notes (Signed)
Called to room to assist during endoscopic procedure.  Patient ID and intended procedure confirmed with present staff. Received instructions for my participation in the procedure from the performing physician.  

## 2018-11-06 NOTE — Op Note (Signed)
[facility] Patient Name: Willie Garcia Procedure Date: 11/06/2018 1:52 PM MRN: [id number] Endoscopist: [name] , MD Age: 73 Referring MD:  Date of Birth: August 17, 1945 Gender: Male Account #: [id number] Procedure:                Colonoscopy Indications:              Surveillance: Personal history of adenomatous                            polyps on last colonoscopy 5 years ago Medicines:                Propofol per Anesthesia, Monitored Anesthesia Care Procedure:                Pre-Anesthesia Assessment:                           - Prior to the procedure, a History and Physical                            was performed, and patient medications and                            allergies were reviewed. The patient's tolerance of                            previous anesthesia was also reviewed. The risks                            and benefits of the procedure and the sedation                            options and risks were discussed with the patient.                            All questions were answered, and informed consent                            was obtained. Prior Anticoagulants: The patient has                            taken no previous anticoagulant or antiplatelet                            agents. ASA Grade Assessment: II - A patient with                            mild systemic disease. After reviewing the risks                            and benefits, the patient was deemed in                            satisfactory condition to undergo the procedure.  After obtaining informed consent, the colonoscope                            was passed under direct vision. Throughout the                            procedure, the patient's blood pressure, pulse, and                            oxygen saturations were monitored continuously. The                            Colonoscope was introduced through the anus and   advanced to the the cecum, identified by                            appendiceal orifice and ileocecal valve. The                            colonoscopy was performed without difficulty. The                            patient tolerated the procedure well. The quality                            of the bowel preparation was good. The bowel                            preparation used was Miralax via split dose                            instruction. The ileocecal valve, appendiceal                            orifice, and rectum were photographed. Scope In: 2:00:39 PM Scope Out: 2:13:46 PM Scope Withdrawal Time: 0 hours 10 minutes 26 seconds  Total Procedure Duration: 0 hours 13 minutes 7 seconds  Findings:                 The perianal and digital rectal examinations were                            normal except small perianal sebaceous cyst which I                            expressed the debris out of after small cut with                            sterile needle. Pertinent negatives include normal                            prostate (size, shape, and consistency).                           Two sessile polyps  were found in the sigmoid colon                            and ascending colon. The polyps were diminutive in                            size. These polyps were removed with a cold snare.                            Resection and retrieval were complete. Verification                            of patient identification for the specimen was                            done. Estimated blood loss was minimal.                           External and internal hemorrhoids were found.                           The exam was otherwise without abnormality on                            direct and retroflexion views. Complications:            No immediate complications. Estimated Blood Loss:     Estimated blood loss was minimal. Impression:               - Two diminutive polyps in the sigmoid colon  and in                            the ascending colon, removed with a cold snare.                            Resected and retrieved.                           - External and internal hemorrhoids.                           - The examination was otherwise normal on direct                            and retroflexion views.                           - Personal history of colonic polyps. 3 diminutive                            adenomas 2011, none in 2015 Recommendation:           - Patient has a contact number available for                            emergencies. The  signs and symptoms of potential                            delayed complications were discussed with the                            patient. Return to normal activities tomorrow.                            Written discharge instructions were provided to the                            patient.                           - Resume previous diet.                           - Continue present medications.                           - Repeat colonoscopy is recommended. The                            colonoscopy date will be determined after pathology                            results from today's exam become available for                            review. [name], MD 11/06/2018 2:35:08 PM This report has been signed electronically.

## 2018-11-06 NOTE — Patient Instructions (Addendum)
I removed 2 tiny polyps that are certainly benign. I will let you know pathology results and when to have another routine colonoscopy by mail and/or My Chart.   I opened up the sebaceous cyst and expressed the debris in there. Hope hat works.  I appreciate the opportunity to care for you. [name], MD, FACG   YOU HAD AN ENDOSCOPIC PROCEDURE TODAY AT [facility]:   Refer to the procedure report that was given to you for any specific questions about what was found during the examination.  If the procedure report does not answer your questions, please call your gastroenterologist to clarify.  If you requested that your care partner not be given the details of your procedure findings, then the procedure report has been included in a sealed envelope for you to review at your convenience later.  YOU SHOULD EXPECT: Some feelings of bloating in the abdomen. Passage of more gas than usual.  Walking can help get rid of the air that was put into your GI tract during the procedure and reduce the bloating. If you had a lower endoscopy (such as a colonoscopy or flexible sigmoidoscopy) you may notice spotting of blood in your stool or on the toilet paper. If you underwent a bowel prep for your procedure, you may not have a normal bowel movement for a few days.  Please Note:  You might notice some irritation and congestion in your nose or some drainage.  This is from the oxygen used during your procedure.  There is no need for concern and it should clear up in a day or so.  SYMPTOMS TO REPORT IMMEDIATELY:   Following lower endoscopy (colonoscopy or flexible sigmoidoscopy):  Excessive amounts of blood in the stool  Significant tenderness or worsening of abdominal pains  Swelling of the abdomen that is new, acute  Fever of 100F or higher   For urgent or emergent issues, a gastroenterologist can be reached at any hour by calling [phone].   DIET:  We do recommend a small meal  at first, but then you may proceed to your regular diet.  Drink plenty of fluids but you should avoid alcoholic beverages for 24 hours.  ACTIVITY:  You should plan to take it easy for the rest of today and you should NOT DRIVE or use heavy machinery until tomorrow (because of the sedation medicines used during the test).    FOLLOW UP: Our staff will call the number listed on your records 48-72 hours following your procedure to check on you and address any questions or concerns that you may have regarding the information given to you following your procedure. If we do not reach you, we will leave a message.  We will attempt to reach you two times.  During this call, we will ask if you have developed any symptoms of COVID 19. If you develop any symptoms (for example fever, flu-like symptoms, shortness of breath, cough etc.) before then, please call [phone].  If any biopsies were taken you will be contacted by phone or by letter within the next 1-3 weeks.  Please call us at [phone] if you have not heard about the biopsies in 3 weeks.    SIGNATURES/CONFIDENTIALITY: You and/or your care partner have signed paperwork which will be entered into your electronic medical record.  These signatures attest to the fact that that the information above on your After Visit Summary has been reviewed and is understood.  Full responsibility of the  confidentiality of this discharge information lies with you and/or your care-partner. 

## 2018-11-08 ENCOUNTER — Telehealth: Payer: Self-pay

## 2018-11-08 NOTE — Telephone Encounter (Signed)
  Follow up Call-  Call back number 11/06/2018  Post procedure Call Back phone  # [phone]  Permission to leave phone message Yes  Some recent data might be hidden     Left message

## 2018-11-09 ENCOUNTER — Telehealth: Payer: Self-pay

## 2018-11-09 NOTE — Telephone Encounter (Signed)
  Follow up Call-  Call back number 11/06/2018  Post procedure Call Back phone  # [phone]  Permission to leave phone message Yes  Some recent data might be hidden     Patient questions:  Do you have a fever, pain , or abdominal swelling? No. Pain Score  0 *  Have you tolerated food without any problems? Yes.    Have you been able to return to your normal activities? Yes.    Do you have any questions about your discharge instructions: Diet   No. Medications  No. Follow up visit  No.  Do you have questions or concerns about your Care? No.  Actions: * If pain score is 4 or above: No action needed, pain <4.   1. Have you developed a fever since your procedure? No.  2.   Have you had an respiratory symptoms (SOB or cough) since your procedure? No.  3.   Have you tested positive for COVID 19 since your procedure No.  4.   Have you had any family members/close contacts diagnosed with the COVID 19 since your procedure?  No.   If any of these questions are a yes, please inquire if patient has been seen by family doctor and route this note to Joylene John, Therapist, sports.

## 2018-11-13 ENCOUNTER — Encounter: Payer: Self-pay | Admitting: Internal Medicine

## 2018-11-13 NOTE — Progress Notes (Signed)
2 diminutive adenomas No recall - findings and age My Chart

## 2018-11-21 ENCOUNTER — Other Ambulatory Visit: Payer: Self-pay | Admitting: Internal Medicine

## 2018-11-21 DIAGNOSIS — I10 Essential (primary) hypertension: Secondary | ICD-10-CM

## 2018-11-24 NOTE — Telephone Encounter (Signed)
Order Providers   Prescribing Provider Encounter Provider  [name], MD [name], MD  Outpatient Medication Detail    Disp Refills Start End   metoprolol succinate (TOPROL-XL) 25 MG 24 hr tablet 30 tablet 6 08/25/2018    Sig: TAKE 1 TABLET ONCE DAILY.   Sent to pharmacy as: metoprolol succinate (TOPROL-XL) 25 MG 24 hr tablet   E-Prescribing Status: Receipt confirmed by pharmacy (08/25/2018 12:58 PM EDT)   Associated Diagnoses   Essential hypertension     Pharmacy   [place], [address]

## 2018-11-26 ENCOUNTER — Telehealth: Payer: Self-pay | Admitting: Internal Medicine

## 2018-11-26 NOTE — Telephone Encounter (Signed)
Pt wife called wanting to speak with the nurse about the pathology results for her husbands procedure. Please call them back at [phone] thanks

## 2018-11-26 NOTE — Telephone Encounter (Signed)
Patient's wife notified of the results.  They do not use My Chart.  All questions answered.  She will call back for any additional questions or concerns.

## 2018-12-08 DIAGNOSIS — R05 Cough: Secondary | ICD-10-CM | POA: Diagnosis not present

## 2018-12-08 DIAGNOSIS — Z03818 Encounter for observation for suspected exposure to other biological agents ruled out: Secondary | ICD-10-CM | POA: Diagnosis not present

## 2018-12-09 DIAGNOSIS — D510 Vitamin B12 deficiency anemia due to intrinsic factor deficiency: Secondary | ICD-10-CM | POA: Diagnosis not present

## 2018-12-09 DIAGNOSIS — W57XXXA Bitten or stung by nonvenomous insect and other nonvenomous arthropods, initial encounter: Secondary | ICD-10-CM | POA: Diagnosis not present

## 2018-12-10 DIAGNOSIS — R82998 Other abnormal findings in urine: Secondary | ICD-10-CM | POA: Diagnosis not present

## 2018-12-10 DIAGNOSIS — E7849 Other hyperlipidemia: Secondary | ICD-10-CM | POA: Diagnosis not present

## 2018-12-10 DIAGNOSIS — Z125 Encounter for screening for malignant neoplasm of prostate: Secondary | ICD-10-CM | POA: Diagnosis not present

## 2018-12-10 DIAGNOSIS — I10 Essential (primary) hypertension: Secondary | ICD-10-CM | POA: Diagnosis not present

## 2018-12-15 ENCOUNTER — Telehealth: Payer: Self-pay | Admitting: Orthopaedic Surgery

## 2018-12-15 ENCOUNTER — Other Ambulatory Visit: Payer: Self-pay | Admitting: *Deleted

## 2018-12-15 MED ORDER — DOXYCYCLINE MONOHYDRATE 100 MG PO TABS: 100.0000 mg | ORAL_TABLET | Freq: Two times a day (BID) | ORAL | 0 refills | Status: DC

## 2018-12-15 NOTE — Telephone Encounter (Signed)
Doxycyline 100 mg BID x 7 days sent into [facility] per Dr. Durward Fortes. I called patient.

## 2018-12-15 NOTE — Telephone Encounter (Signed)
Willie Garcia called stating Willie Garcia's big toe on his left foot became red and pussy last night.   Willie Garcia states he soaked it in [place] and was able to relieve the pressure.  Willie Garcia is requesting a prescription for antibiotics to be called into [facility].  Willie Garcia states his toe usually gets better with the antibiotics and would like to avoid scheduling an appointment if possible.  Please advise.

## 2018-12-17 DIAGNOSIS — E785 Hyperlipidemia, unspecified: Secondary | ICD-10-CM | POA: Diagnosis not present

## 2018-12-17 DIAGNOSIS — J302 Other seasonal allergic rhinitis: Secondary | ICD-10-CM | POA: Diagnosis not present

## 2018-12-17 DIAGNOSIS — K219 Gastro-esophageal reflux disease without esophagitis: Secondary | ICD-10-CM | POA: Diagnosis not present

## 2018-12-17 DIAGNOSIS — F4321 Adjustment disorder with depressed mood: Secondary | ICD-10-CM | POA: Diagnosis not present

## 2018-12-17 DIAGNOSIS — I10 Essential (primary) hypertension: Secondary | ICD-10-CM | POA: Diagnosis not present

## 2018-12-17 DIAGNOSIS — Z1331 Encounter for screening for depression: Secondary | ICD-10-CM | POA: Diagnosis not present

## 2018-12-17 DIAGNOSIS — M8580 Other specified disorders of bone density and structure, unspecified site: Secondary | ICD-10-CM | POA: Diagnosis not present

## 2018-12-17 DIAGNOSIS — B029 Zoster without complications: Secondary | ICD-10-CM | POA: Diagnosis not present

## 2018-12-17 DIAGNOSIS — I2510 Atherosclerotic heart disease of native coronary artery without angina pectoris: Secondary | ICD-10-CM | POA: Diagnosis not present

## 2018-12-17 DIAGNOSIS — Z0000 Encounter for general adult medical examination without abnormal findings: Secondary | ICD-10-CM | POA: Diagnosis not present

## 2018-12-17 DIAGNOSIS — C8590 Non-Hodgkin lymphoma, unspecified, unspecified site: Secondary | ICD-10-CM | POA: Diagnosis not present

## 2018-12-17 DIAGNOSIS — Z1339 Encounter for screening examination for other mental health and behavioral disorders: Secondary | ICD-10-CM | POA: Diagnosis not present

## 2018-12-17 DIAGNOSIS — D510 Vitamin B12 deficiency anemia due to intrinsic factor deficiency: Secondary | ICD-10-CM | POA: Diagnosis not present

## 2018-12-30 DIAGNOSIS — M47816 Spondylosis without myelopathy or radiculopathy, lumbar region: Secondary | ICD-10-CM | POA: Diagnosis not present

## 2019-01-14 ENCOUNTER — Other Ambulatory Visit: Payer: Self-pay

## 2019-01-14 DIAGNOSIS — C8204 Follicular lymphoma grade I, lymph nodes of axilla and upper limb: Secondary | ICD-10-CM

## 2019-01-15 ENCOUNTER — Other Ambulatory Visit: Payer: Self-pay

## 2019-01-15 ENCOUNTER — Inpatient Hospital Stay: Payer: Medicare Other | Attending: Oncology

## 2019-01-15 DIAGNOSIS — Z8572 Personal history of non-Hodgkin lymphomas: Secondary | ICD-10-CM | POA: Insufficient documentation

## 2019-01-15 DIAGNOSIS — C8204 Follicular lymphoma grade I, lymph nodes of axilla and upper limb: Secondary | ICD-10-CM

## 2019-01-15 DIAGNOSIS — C8200 Follicular lymphoma grade I, unspecified site: Secondary | ICD-10-CM

## 2019-01-15 LAB — CMP (CANCER CENTER ONLY)
ALT: 9 U/L (ref 0–44)
AST: 14 U/L — ABNORMAL LOW (ref 15–41)
Albumin: 3.9 g/dL (ref 3.5–5.0)
Alkaline Phosphatase: 90 U/L (ref 38–126)
Anion gap: 5 (ref 5–15)
BUN: 14 mg/dL (ref 8–23)
CO2: 30 mmol/L (ref 22–32)
Calcium: 9.1 mg/dL (ref 8.9–10.3)
Chloride: 104 mmol/L (ref 98–111)
Creatinine: 1.17 mg/dL (ref 0.61–1.24)
GFR, Est AFR Am: >60 mL/min (ref >60)
GFR, Estimated: >60 mL/min (ref >60)
Glucose, Bld: 90 mg/dL (ref 70–99)
Potassium: 4.3 mmol/L (ref 3.5–5.1)
Sodium: 139 mmol/L (ref 135–145)
Total Bilirubin: 0.7 mg/dL (ref 0.3–1.2)
Total Protein: 6.7 g/dL (ref 6.5–8.1)

## 2019-01-15 LAB — CBC WITH DIFFERENTIAL (CANCER CENTER ONLY)
Abs Immature Granulocytes: 0.01 10*3/uL (ref 0.00–0.07)
Basophils Absolute: 0 10*3/uL (ref 0.0–0.1)
Basophils Relative: 1 %
Eosinophils Absolute: 0.1 10*3/uL (ref 0.0–0.5)
Eosinophils Relative: 2 %
HCT: 40.7 % (ref 39.0–52.0)
Hemoglobin: 13.2 g/dL (ref 13.0–17.0)
Immature Granulocytes: 0 %
Lymphocytes Relative: 21 %
Lymphs Abs: 1.3 10*3/uL (ref 0.7–4.0)
MCH: 30.2 pg (ref 26.0–34.0)
MCHC: 32.4 g/dL (ref 30.0–36.0)
MCV: 93.1 fL (ref 80.0–100.0)
Monocytes Absolute: 0.5 10*3/uL (ref 0.1–1.0)
Monocytes Relative: 8 %
Neutro Abs: 4.2 10*3/uL (ref 1.7–7.7)
Neutrophils Relative %: 68 %
Platelet Count: 215 10*3/uL (ref 150–400)
RBC: 4.37 MIL/uL (ref 4.22–5.81)
RDW: 12.2 % (ref 11.5–15.5)
WBC Count: 6.2 10*3/uL (ref 4.0–10.5)
nRBC: 0 % (ref 0.0–0.2)

## 2019-01-15 LAB — LACTATE DEHYDROGENASE: LDH: 139 U/L (ref 98–192)

## 2019-01-16 LAB — BETA 2 MICROGLOBULIN, SERUM: Beta-2 Microglobulin: 1.8 mg/L (ref 0.6–2.4)

## 2019-01-21 NOTE — Progress Notes (Signed)
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 He continues under observation.   Since his last visit, he underwent colonoscopy on 11/06/2018. Pathology from the procedure ([id number]) was benign.   REVIEW OF SYSTEMS: Willie Garcia reports hee has a history of back curvature, which is states cannot be operated on. He reports it is very painful. He has a note from his wife. She notes he has tick bites, swelling in his leg. He states he eats breakfast and doesn't eat again. He takes advantage of the nice weather and goes outside. 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 His weight has been stable. A detailed review of systems was otherwise stable.   Wt Readings from Last 3 Encounters:  11/06/18 150 lb (68 kg)  11/04/18 150 lb (68 kg)  08/29/18 142 lb (64.4 kg)     HISTORY OF PRESENT ILLNESS:  from the original intake note:  Willie Garcia has long had back problems and has been followed for this by Dr. [name].  On November 20, 2005, the patient had an MRI of the lumbar spine without contrast for evaluation of his chronic progressive low back and left buttock pain.  This showed confluent retroperitoneal adenopathy encasing the renal vessels and measuring up to 5.2 x 3.4 cm transversely. There was no epidural mass and the kidneys appeared unremarkable.  This had been compared with an MRI from Nov 09, 2004 where no  such adenopathy was noted.    The patient's wife, Willie Garcia, is a Museum/gallery curator at [name], so she brought this to the attention of [name] and a CT Scan of the chest, abdomen and pelvis was obtained there on June 11th.  It was ready by [name], showing some small mediastinal lymph nodes, the largest being 1.4 cm, but significant adenopathy surrounding the left renal vein with anterior displacement of that vein as well as the inferior vena cava.  This measured 5.2 cm in transverse diameter and 3.8 cm in AP diameter.  The adenopathy extended inferiorly to the level of the aortic bifurcation.  There were small mesenteric lymph nodes, all less than 12 mm. The spleen was upper normal in size with no focal lesions.  There was no pelvic adenopathy.    His subsequent history is as detailed below   PAST MEDICAL HISTORY: Past Medical History:  Diagnosis Date  . Allergic rhinitis   . Anemia   . Anxiety   . Basal cell carcinoma of skin 2015  . BPH (benign prostatic hyperplasia)   . CAD (coronary artery disease)   . Depression   . Ganglion cyst    left thumb  . GERD (gastroesophageal reflux disease)   . HTN (hypertension)   . Hyperlipidemia   . Non Hodgkin's lymphoma ([place])   . Osteopenia   . Personal history of colonic adenomas 04/04/2010  . Scoliosis   . 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 CARDIAC CATHETERIZATION  07/14/2014   Procedure: LEFT HEART CATH AND CORS/GRAFTS ANGIOGRAPHY;  Surgeon: Jettie Booze, MD;  Location: [facility] CATH LAB;  Service: Cardiovascular;;  . CARDIAC CATHETERIZATION  07/14/2014   Procedure: INTRAVASCULAR PRESSURE WIRE/FFR STUDY;  Surgeon: Jettie Booze, MD;  Location: [facility] CATH LAB;  Service: Cardiovascular;;  circ  . CERVICAL SPINE SURGERY     C4-C6  . COLONOSCOPY  06/2013   negative  . CORONARY ARTERY BYPASS GRAFT    . HERNIA REPAIR    . RHINOPLASTY    .  UPPER GASTROINTESTINAL ENDOSCOPY      FAMILY HISTORY Family History  Problem Relation Age of Onset  . Heart disease Mother   . Heart attack Mother 42  . Heart disease Father   . Heart attack Father 51  . Heart attack Brother 36  . Diabetes Brother   . Aneurysm Paternal Uncle   . Aneurysm Maternal Grandmother   . Heart attack Maternal Grandmother   . Aneurysm Maternal Grandfather   . Heart attack Maternal Grandfather   . Colon cancer Neg Hx   . Stomach cancer Neg Hx   . Esophageal cancer Neg Hx   . Rectal cancer Neg Hx   The patient's father died at the age of 40 from heart problems.  The patient's mother died at the age of 52 from heart problems.  The patient has three brothers and two sisters.  The only cancer in the family known to the patient is the maternal grandmother, who had breast cancer diagnosed in her fifties or 32.     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  He has been married to [place] for >30 years.  They have no children of their own. She worked for [name] until August 2018. They attend [employer]    ADVANCED DIRECTIVES: in place   HEALTH MAINTENANCE: Social History   Tobacco Use  . Smoking status: Never Smoker  . Smokeless tobacco: Never Used  Substance Use Topics  . Alcohol use: No    Alcohol/week: 0.0 standard drinks  . Drug use: No     Colonoscopy: 10/2018, benign [name])  PSA: 11/2018, 1.1  Bone density:  Lipid panel:  No Known Allergies  Current Outpatient Medications  Medication Sig Dispense Refill  . acetaminophen (TYLENOL) 500 MG tablet Take 500 mg by mouth every 6 (six) hours as needed (pain).    Marland Kitchen amoxicillin (AMOXIL) 875 MG tablet Take 875 mg by mouth 2 (two) times daily.    Marland Kitchen aspirin 81 MG tablet Take 1 tablet (81 mg total) by mouth daily.    Marland Kitchen CALCIUM-VITAMIN D PO Take 1 tablet by mouth 2 (two) times daily. (600-200)    . Cholecalciferol (VITAMIN D3) 2000 UNITS  TABS Take 2,000 Units by mouth daily.    . Cyanocobalamin (VITAMIN B-12 IJ) Inject 1 mL as directed every 30 (thirty) days.     Marland Kitchen dicyclomine (BENTYL) 20 MG tablet Take 1 tablet (20 mg total) by mouth every 6 (six) hours as needed for spasms. 360 tablet 3  . doxycycline (ADOXA) 100 MG tablet Take 1 tablet (100 mg total) by mouth 2 (two) times daily. 14 tablet 0  . ezetimibe (ZETIA) 10 MG tablet Take 10 mg by mouth daily.    . fluticasone (FLONASE) 50 MCG/ACT nasal spray Place 2 sprays into both nostrils daily as needed for allergies or rhinitis.     . folic acid (FOLVITE)  1 MG tablet Take 1 mg by mouth daily.      Marland Kitchen gabapentin (NEURONTIN) 300 MG capsule Take 1 capsule (300 mg total) by mouth at bedtime. (Patient taking differently: Take 300 mg by mouth at bedtime. Takes when his feet burn) 90 capsule 4  . loratadine (CLARITIN) 10 MG tablet Take 10 mg by mouth daily as needed for allergies.    Marland Kitchen metoCLOPramide (REGLAN) 5 MG tablet TAKE 1 TABLET BY MOUTH THREE TIMES DAILY BEFORE MEALS AS NEEDED. 270 tablet 3  . metoprolol succinate (TOPROL-XL) 25 MG 24 hr tablet TAKE 1 TABLET ONCE DAILY. 30 tablet 6  . nitroGLYCERIN (NITROSTAT) 0.4 MG SL tablet Place 1 tablet (0.4 mg total) under the tongue every 5 (five) minutes as needed for chest pain. 10 tablet 0  . pantoprazole (PROTONIX) 20 MG tablet TAKE 1 TABLET DAILY BEFORE BREAKFAST. 30 tablet 5  . rosuvastatin (CRESTOR) 20 MG tablet Take 20 mg by mouth daily.      Marland Kitchen testosterone cypionate (DEPOTESTOTERONE CYPIONATE) 200 MG/ML injection Inject 50 mg into the muscle once a week. AS DIRECTED    . vitamin C (ASCORBIC ACID) 500 MG tablet Take 500 mg by mouth daily.     No current facility-administered medications for this visit.     OBJECTIVE: Middle-aged white man in no acute distress   Vitals:   01/22/19 1316  BP: (!) 112/57  Pulse: 65  Resp: 18  Temp: 98.7 F (37.1 C)  SpO2: 99%     Body mass index is 21.52 kg/m.    ECOG FS: 1  Sclerae  unicteric, pupils round and equal Wearing a mask No cervical or supraclavicular adenopathy, no axillary or inguinal adenopathy Lungs no rales or rhonchi Heart regular rate and rhythm Abd soft, nontender, positive bowel sounds MSK no focal spinal tenderness, no upper extremity lymphedema Neuro: nonfocal, well oriented, appropriate affect Skin: Several insect bites, but no suspicious lesions  LAB RESULTS: Lab Results  Component Value Date   WBC 6.2 01/15/2019   NEUTROABS 4.2 01/15/2019   HGB 13.2 01/15/2019   HCT 40.7 01/15/2019   MCV 93.1 01/15/2019   PLT 215 01/15/2019      Chemistry      Component Value Date/Time   NA 139 01/15/2019 1138   NA 140 01/03/2017 1033   K 4.3 01/15/2019 1138   K 4.1 01/03/2017 1033   CL 104 01/15/2019 1138   CL 101 06/30/2012 1325   CO2 30 01/15/2019 1138   CO2 28 01/03/2017 1033   BUN 14 01/15/2019 1138   BUN 17.5 01/03/2017 1033   CREATININE 1.17 01/15/2019 1138   CREATININE 1.0 01/03/2017 1033      Component Value Date/Time   CALCIUM 9.1 01/15/2019 1138   CALCIUM 9.4 01/03/2017 1033   ALKPHOS 90 01/15/2019 1138   ALKPHOS 96 01/03/2017 1033   AST 14 (L) 01/15/2019 1138   AST 13 01/03/2017 1033   ALT 9 01/15/2019 1138   ALT 8 01/03/2017 1033   BILITOT 0.7 01/15/2019 1138   BILITOT 0.92 01/03/2017 1033       No results found for: LABCA2  No components found for: LABCA125  No results for input(s): INR in the last 168 hours.  Urinalysis No results found for: COLORURINE  STUDIES: No results found.   ASSESSMENT: 73 y.o. 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   PLAN:  Taijuan is now 13 years out from initial diagnosis of his low-grade  non-Hodgkin's lymphoma.  There is no evidence of disease activity.  He is benefiting greatly from Dr. Maryjean Ka interventions and tells me his back pain is much better as a result.  He is more active when it is a little cooler but he has been outside enough to get quite a few tick bites and he is receiving a course of doxycycline to cover that through his primary care physician  I do not see any evidence of the depressive affect that he exhibited sometime ago.  There is also no evidence at present of iron deficiency  He will see me again in 1 year.  He knows to call for any other issue that may develop before then.   Vondell Babers, [name], MD  01/22/19 1:46 PM Medical Oncology and Hematology [facility] [address] [place], [place] [address] Tel. [phone]    Fax. [phone]   I, Wilburn Mylar, am acting as scribe for Dr. [name]. Ellorie Kindall.  I, Lurline Del MD, have reviewed the above documentation for accuracy and completeness, and I agree with the above.

## 2019-01-22 ENCOUNTER — Inpatient Hospital Stay: Payer: Medicare Other | Attending: Oncology | Admitting: Oncology

## 2019-01-22 ENCOUNTER — Other Ambulatory Visit: Payer: Self-pay

## 2019-01-22 VITALS — BP 112/57 | HR 65 | Temp 98.7°F | Resp 18 | Ht 70.0 in

## 2019-01-22 DIAGNOSIS — M4122 Other idiopathic scoliosis, cervical region: Secondary | ICD-10-CM

## 2019-01-22 DIAGNOSIS — I10 Essential (primary) hypertension: Secondary | ICD-10-CM

## 2019-01-22 DIAGNOSIS — M545 Low back pain: Secondary | ICD-10-CM | POA: Insufficient documentation

## 2019-01-22 DIAGNOSIS — C8204 Follicular lymphoma grade I, lymph nodes of axilla and upper limb: Secondary | ICD-10-CM

## 2019-01-22 DIAGNOSIS — Z8572 Personal history of non-Hodgkin lymphomas: Secondary | ICD-10-CM | POA: Insufficient documentation

## 2019-01-22 MED ORDER — GABAPENTIN 300 MG PO CAPS: 300.0000 mg | ORAL_CAPSULE | Freq: Every day | ORAL | 4 refills | Status: DC

## 2019-01-23 ENCOUNTER — Telehealth: Payer: Self-pay | Admitting: Oncology

## 2019-01-23 NOTE — Telephone Encounter (Signed)
I left a message regarding schedule  

## 2019-01-28 DIAGNOSIS — H9201 Otalgia, right ear: Secondary | ICD-10-CM | POA: Diagnosis not present

## 2019-01-28 DIAGNOSIS — R6884 Jaw pain: Secondary | ICD-10-CM | POA: Diagnosis not present

## 2019-01-28 DIAGNOSIS — J40 Bronchitis, not specified as acute or chronic: Secondary | ICD-10-CM | POA: Diagnosis not present

## 2019-01-28 DIAGNOSIS — R05 Cough: Secondary | ICD-10-CM | POA: Diagnosis not present

## 2019-02-05 DIAGNOSIS — M47816 Spondylosis without myelopathy or radiculopathy, lumbar region: Secondary | ICD-10-CM | POA: Diagnosis not present

## 2019-02-28 DIAGNOSIS — Z23 Encounter for immunization: Secondary | ICD-10-CM | POA: Diagnosis not present

## 2019-03-12 DIAGNOSIS — I10 Essential (primary) hypertension: Secondary | ICD-10-CM | POA: Diagnosis not present

## 2019-03-12 DIAGNOSIS — M47816 Spondylosis without myelopathy or radiculopathy, lumbar region: Secondary | ICD-10-CM | POA: Diagnosis not present

## 2019-03-12 DIAGNOSIS — M419 Scoliosis, unspecified: Secondary | ICD-10-CM | POA: Diagnosis not present

## 2019-04-16 ENCOUNTER — Ambulatory Visit: Payer: Medicare Other | Admitting: Internal Medicine

## 2019-04-17 ENCOUNTER — Encounter: Payer: Self-pay | Admitting: Internal Medicine

## 2019-04-17 ENCOUNTER — Other Ambulatory Visit: Payer: Self-pay

## 2019-04-17 ENCOUNTER — Ambulatory Visit (INDEPENDENT_AMBULATORY_CARE_PROVIDER_SITE_OTHER): Payer: Medicare Other | Admitting: Internal Medicine

## 2019-04-17 VITALS — BP 104/64 | HR 56 | Ht 70.0 in | Wt 150.0 lb

## 2019-04-17 DIAGNOSIS — E785 Hyperlipidemia, unspecified: Secondary | ICD-10-CM | POA: Diagnosis not present

## 2019-04-17 DIAGNOSIS — I10 Essential (primary) hypertension: Secondary | ICD-10-CM

## 2019-04-17 DIAGNOSIS — I2510 Atherosclerotic heart disease of native coronary artery without angina pectoris: Secondary | ICD-10-CM

## 2019-04-17 NOTE — Progress Notes (Signed)
HPI Willie Garcia returns today for followup of his CAD, s/p MI. The patient underwent CABG over 20 years ago. He did not have much in the way of angina. He has become more sedentary. He gets tired a bit more easily. He denies angina. He has atypical chest pain at times. No edema. He has chronic lymphoma and is under observation. He has been depressed by the loss of a rifle that had been given to him by his grandfather.  No Known Allergies   Current Outpatient Medications  Medication Sig Dispense Refill  . acetaminophen (TYLENOL) 500 MG tablet Take 500 mg by mouth every 6 (six) hours as needed (pain).    Marland Kitchen aspirin 81 MG tablet Take 1 tablet (81 mg total) by mouth daily.    Marland Kitchen CALCIUM-VITAMIN D PO Take 1 tablet by mouth 2 (two) times daily. (600-200)    . Cholecalciferol (VITAMIN D3) 2000 UNITS TABS Take 2,000 Units by mouth daily.    . Cyanocobalamin (VITAMIN B-12 IJ) Inject 1 mL as directed every 30 (thirty) days.     Marland Kitchen dicyclomine (BENTYL) 20 MG tablet Take 1 tablet (20 mg total) by mouth every 6 (six) hours as needed for spasms. 360 tablet 3  . doxycycline (ADOXA) 100 MG tablet Take 1 tablet (100 mg total) by mouth 2 (two) times daily. 14 tablet 0  . ezetimibe (ZETIA) 10 MG tablet Take 10 mg by mouth daily.    . fluticasone (FLONASE) 50 MCG/ACT nasal spray Place 2 sprays into both nostrils daily as needed for allergies or rhinitis.     . folic acid (FOLVITE) 1 MG tablet Take 1 mg by mouth daily.      Marland Kitchen gabapentin (NEURONTIN) 300 MG capsule Take 1 capsule (300 mg total) by mouth at bedtime. 90 capsule 4  . loratadine (CLARITIN) 10 MG tablet Take 10 mg by mouth daily as needed for allergies.    Marland Kitchen metoCLOPramide (REGLAN) 5 MG tablet TAKE 1 TABLET BY MOUTH THREE TIMES DAILY BEFORE MEALS AS NEEDED. 270 tablet 3  . metoprolol succinate (TOPROL-XL) 25 MG 24 hr tablet TAKE 1 TABLET ONCE DAILY. 30 tablet 6  . nitroGLYCERIN (NITROSTAT) 0.4 MG SL tablet Place 1 tablet (0.4 mg total) under the  tongue every 5 (five) minutes as needed for chest pain. 10 tablet 0  . pantoprazole (PROTONIX) 20 MG tablet TAKE 1 TABLET DAILY BEFORE BREAKFAST. 30 tablet 5  . rosuvastatin (CRESTOR) 20 MG tablet Take 20 mg by mouth daily.      Marland Kitchen testosterone cypionate (DEPOTESTOTERONE CYPIONATE) 200 MG/ML injection Inject 50 mg into the muscle once a week. AS DIRECTED    . vitamin C (ASCORBIC ACID) 500 MG tablet Take 500 mg by mouth daily.     No current facility-administered medications for this visit.      Past Medical History:  Diagnosis Date  . Allergic rhinitis   . Anemia   . Anxiety   . Basal cell carcinoma of skin 2015  . BPH (benign prostatic hyperplasia)   . CAD (coronary artery disease)   . Depression   . Ganglion cyst    left thumb  . GERD (gastroesophageal reflux disease)   . HTN (hypertension)   . Hyperlipidemia   . Non Hodgkin's lymphoma ([place])   . Osteopenia   . Personal history of colonic adenomas 04/04/2010  . Scoliosis   . Spinal stenosis    30 degree curve in back    ROS:   All  systems reviewed and negative except as noted in the HPI.   Past Surgical History:  Procedure Laterality Date  . CARDIAC CATHETERIZATION  07/14/2014   Procedure: LEFT HEART CATH AND CORS/GRAFTS ANGIOGRAPHY;  Surgeon: Jettie Booze, MD;  Location: [facility] CATH LAB;  Service: Cardiovascular;;  . CARDIAC CATHETERIZATION  07/14/2014   Procedure: INTRAVASCULAR PRESSURE WIRE/FFR STUDY;  Surgeon: Jettie Booze, MD;  Location: [facility] CATH LAB;  Service: Cardiovascular;;  circ  . CERVICAL SPINE SURGERY     C4-C6  . COLONOSCOPY  06/2013   negative  . CORONARY ARTERY BYPASS GRAFT    . HERNIA REPAIR    . RHINOPLASTY    . UPPER GASTROINTESTINAL ENDOSCOPY       Family History  Problem Relation Age of Onset  . Heart disease Mother   . Heart attack Mother 90  . Heart disease Father   . Heart attack Father 23  . Heart attack Brother 59  . Diabetes Brother   . Aneurysm Paternal Uncle   .  Aneurysm Maternal Grandmother   . Heart attack Maternal Grandmother   . Aneurysm Maternal Grandfather   . Heart attack Maternal Grandfather   . Colon cancer Neg Hx   . Stomach cancer Neg Hx   . Esophageal cancer Neg Hx   . Rectal cancer Neg Hx      Social History   Socioeconomic History  . Marital status: Married    Spouse name: [name]  . Number of children: 0  . Years of education: Not on file  . Highest education level: Not on file  Occupational History  . Occupation: Retired  Scientific laboratory technician  . Financial resource strain: Not on file  . Food insecurity    Worry: Not on file    Inability: Not on file  . Transportation needs    Medical: Not on file    Non-medical: Not on file  Tobacco Use  . Smoking status: Never Smoker  . Smokeless tobacco: Never Used  Substance and Sexual Activity  . Alcohol use: No    Alcohol/week: 0.0 standard drinks  . Drug use: No  . Sexual activity: Not on file  Lifestyle  . Physical activity    Days per week: Not on file    Minutes per session: Not on file  . Stress: Not on file  Relationships  . Social Herbalist on phone: Not on file    Gets together: Not on file    Attends religious service: Not on file    Active member of club or organization: Not on file    Attends meetings of clubs or organizations: Not on file    Relationship status: Not on file  . Intimate partner violence    Fear of current or ex partner: Not on file    Emotionally abused: Not on file    Physically abused: Not on file    Forced sexual activity: Not on file  Other Topics Concern  . Not on file  Social History Narrative   Married, wife is a Marine scientist, no children   Retired Microbiologist, flies as a hobby   2 caffeinated beverages daily     BP 104/64   Pulse (!) 56   Ht 5\' 10"  (1.778 m)   Wt 150 lb (68 kg)   SpO2 99%   BMI 21.52 kg/m   Physical Exam:  Well appearing NAD HEENT: Unremarkable Neck:  No JVD, no thyromegally Lymphatics:   No adenopathy Back:  No CVA tenderness Lungs:  Clear with no wheezes HEART:  Regular rate rhythm, no murmurs, no rubs, no clicks Abd:  soft, positive bowel sounds, no organomegally, no rebound, no guarding Ext:  2 plus pulses, no edema, no cyanosis, no clubbing Skin:  No rashes no nodules Neuro:  CN II through XII intact, motor grossly intact  EKG - nsr   Assess/Plan: 1. CAD - he is asymptomatic now over 20 years after CABG. He is encouraged to maintain an active lifestyle.  2. Depression - he appears down over the loss of a rifle given to him by his grandfather. I told him I hoped it would be recovered.  3. Dyslipidemia - he will continue his crestor and zetia.  Mikle Bosworth.D.

## 2019-04-17 NOTE — Patient Instructions (Signed)

## 2019-04-23 DIAGNOSIS — R948 Abnormal results of function studies of other organs and systems: Secondary | ICD-10-CM | POA: Diagnosis not present

## 2019-04-23 DIAGNOSIS — E291 Testicular hypofunction: Secondary | ICD-10-CM | POA: Diagnosis not present

## 2019-04-30 DIAGNOSIS — N5201 Erectile dysfunction due to arterial insufficiency: Secondary | ICD-10-CM | POA: Diagnosis not present

## 2019-04-30 DIAGNOSIS — R351 Nocturia: Secondary | ICD-10-CM | POA: Diagnosis not present

## 2019-04-30 DIAGNOSIS — R3915 Urgency of urination: Secondary | ICD-10-CM | POA: Diagnosis not present

## 2019-04-30 DIAGNOSIS — E291 Testicular hypofunction: Secondary | ICD-10-CM | POA: Diagnosis not present

## 2019-04-30 DIAGNOSIS — N401 Enlarged prostate with lower urinary tract symptoms: Secondary | ICD-10-CM | POA: Diagnosis not present

## 2019-06-06 ENCOUNTER — Other Ambulatory Visit: Payer: Self-pay | Admitting: Oncology

## 2019-06-16 ENCOUNTER — Other Ambulatory Visit: Payer: Self-pay

## 2019-06-16 ENCOUNTER — Ambulatory Visit (INDEPENDENT_AMBULATORY_CARE_PROVIDER_SITE_OTHER): Payer: Medicare Other | Admitting: Orthopaedic Surgery

## 2019-06-16 ENCOUNTER — Encounter: Payer: Self-pay | Admitting: Orthopedic Surgery

## 2019-06-16 ENCOUNTER — Ambulatory Visit (INDEPENDENT_AMBULATORY_CARE_PROVIDER_SITE_OTHER): Payer: Medicare Other

## 2019-06-16 VITALS — Ht 70.0 in | Wt 138.0 lb

## 2019-06-16 DIAGNOSIS — S66809A Unspecified injury of other specified muscles, fascia and tendons at wrist and hand level, unspecified hand, initial encounter: Secondary | ICD-10-CM | POA: Diagnosis not present

## 2019-06-16 DIAGNOSIS — I2510 Atherosclerotic heart disease of native coronary artery without angina pectoris: Secondary | ICD-10-CM

## 2019-06-16 DIAGNOSIS — M79642 Pain in left hand: Secondary | ICD-10-CM | POA: Diagnosis not present

## 2019-06-16 NOTE — Progress Notes (Signed)
Office Visit Note   Patient: Willie Garcia           Date of Birth: [birth year]           MRN: [id number] Visit Date: 06/16/2019              Requested by: Garald Balding, MD [address] [place],  [place] [address] PCP: Prince Solian, MD   Assessment & Plan: Visit Diagnoses:  1. Pain in left hand   2. Intermetacarpal ligament injury, initial encounter     Plan:  #1: We are placing him into an ulnar gutter removable splint holding the fourth and fifth fingers together. #2: He will ice and elevate this. #3: Follow back up 2 weeks for recheck evaluation.  Follow-Up Instructions: Return in about 2 weeks (around 06/30/2019).   Orders:  Orders Placed This Encounter  Procedures  . XR Hand Complete Left   No orders of the defined types were placed in this encounter.     Procedures: No procedures performed   Clinical Data: No additional findings.   Subjective: Chief Complaint  Patient presents with  . Left Hand - Pain  DOI 06/15/2019   HPI Patient presents today with left hand pain. He fell yesterday and caught himself with that hand. He said that he turned his pinky finger backwards.His hand is swollen and painful all over. He is taking tylenol for pain. He is right hand dominant. He taped his pinky and ring finger together and states that it helped with the pain.  He states as if his finger was abducted at the MCP joint.  His pain is mainly between the metacarpal heads.   Review of Systems  Constitutional: Negative for fatigue.  HENT: Negative for ear pain.   Eyes: Negative for pain.  Respiratory: Negative for shortness of breath.   Cardiovascular: Negative for leg swelling.  Gastrointestinal: Negative for constipation and diarrhea.  Endocrine: Negative for cold intolerance and heat intolerance.  Genitourinary: Negative for difficulty urinating.  Musculoskeletal: Positive for joint swelling.  Skin: Negative for rash.  Allergic/Immunologic: Negative  for food allergies.  Neurological: Negative for weakness.  Hematological: Does not bruise/bleed easily.  Psychiatric/Behavioral: Positive for sleep disturbance.     Objective: Vital Signs: Ht 5\' 10"  (1.778 m)   Wt 138 lb (62.6 kg)   BMI 19.80 kg/m   Physical Exam Constitutional:      Appearance: He is well-developed.  Eyes:     Pupils: Pupils are equal, round, and reactive to light.  Pulmonary:     Effort: Pulmonary effort is normal.  Skin:    General: Skin is warm and dry.  Neurological:     Mental Status: He is alert and oriented to person, place, and time.  Psychiatric:        Behavior: Behavior normal.     Ortho Exam  Exam today reveals diffuse swelling about the hand from the mid metacarpals distally.  He is tender to palpation at the intermetacarpal area more at the metacarpal heads.  He is able to make a fist but is unable to make a full fist with his little finger.  He does not appear to be rotated at this time that we cannot quite get the little finger to the ring finger.  He is got good capillary refill.  Sensation is intact to light touch.  Specialty Comments:  No specialty comments available.  Imaging: XR Hand Complete Left  Result Date: 06/16/2019 Three-view x-rays of the left hand  does not reveal any fractures.  Not particularly widened at the fourth fifth intermetacarpal area.  He does have some cystic changes in the third metacarpal head.  No obvious metacarpal fracture.    PMFS History: Current Outpatient Medications  Medication Sig Dispense Refill  . acetaminophen (TYLENOL) 500 MG tablet Take 500 mg by mouth every 6 (six) hours as needed (pain).    Marland Kitchen aspirin 81 MG tablet Take 1 tablet (81 mg total) by mouth daily.    Marland Kitchen CALCIUM-VITAMIN D PO Take 1 tablet by mouth 2 (two) times daily. (600-200)    . Cholecalciferol (VITAMIN D3) 2000 UNITS TABS Take 2,000 Units by mouth daily.    . Cyanocobalamin (VITAMIN B-12 IJ) Inject 1 mL as directed every 30  (thirty) days.     Marland Kitchen dicyclomine (BENTYL) 20 MG tablet Take 1 tablet (20 mg total) by mouth every 6 (six) hours as needed for spasms. 360 tablet 3  . doxycycline (ADOXA) 100 MG tablet Take 1 tablet (100 mg total) by mouth 2 (two) times daily. 14 tablet 0  . escitalopram (LEXAPRO) 10 MG tablet TAKE 1 TABLET EACH DAY. 90 tablet 0  . ezetimibe (ZETIA) 10 MG tablet Take 10 mg by mouth daily.    . fluticasone (FLONASE) 50 MCG/ACT nasal spray Place 2 sprays into both nostrils daily as needed for allergies or rhinitis.     . folic acid (FOLVITE) 1 MG tablet Take 1 mg by mouth daily.      Marland Kitchen gabapentin (NEURONTIN) 300 MG capsule Take 1 capsule (300 mg total) by mouth at bedtime. 90 capsule 4  . loratadine (CLARITIN) 10 MG tablet Take 10 mg by mouth daily as needed for allergies.    Marland Kitchen metoCLOPramide (REGLAN) 5 MG tablet TAKE 1 TABLET BY MOUTH THREE TIMES DAILY BEFORE MEALS AS NEEDED. 270 tablet 3  . metoprolol succinate (TOPROL-XL) 25 MG 24 hr tablet TAKE 1 TABLET ONCE DAILY. 30 tablet 6  . nitroGLYCERIN (NITROSTAT) 0.4 MG SL tablet Place 1 tablet (0.4 mg total) under the tongue every 5 (five) minutes as needed for chest pain. 10 tablet 0  . pantoprazole (PROTONIX) 20 MG tablet TAKE 1 TABLET DAILY BEFORE BREAKFAST. 30 tablet 5  . rosuvastatin (CRESTOR) 20 MG tablet Take 20 mg by mouth daily.      Marland Kitchen testosterone cypionate (DEPOTESTOTERONE CYPIONATE) 200 MG/ML injection Inject 50 mg into the muscle once a week. AS DIRECTED    . vitamin C (ASCORBIC ACID) 500 MG tablet Take 500 mg by mouth daily.     No current facility-administered medications for this visit.    Patient Active Problem List   Diagnosis Date Noted  . Intermetacarpal ligament injury, initial encounter 06/16/2019  . Sebaceous cyst - perianal 11/04/2018  . Pain in left hip 08/29/2018  . Pain of left great toe 08/29/2018  . Chronic pain 01/22/2018  . Aortic atherosclerosis ([place]) 01/21/2018  . Chest pain at rest   . Abnormal nuclear stress  test 07/14/2014  . Chest pain with high risk for cardiac etiology - Atypical Pain, but abnormal Myoview. 07/13/2014  . Hyperlipidemia 07/13/2014  . Iron deficiency anemia, unspecified 05/19/2013  . B12 deficiency 05/19/2013  . Dyspepsia 05/19/2013  . Scoliosis   . BPH (benign prostatic hyperplasia)   . 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 Abdominal bruit 10/09/2010  . Coronary atherosclerosis 01/18/2009  . DYSLIPIDEMIA 10/02/2008  . Essential hypertension 10/02/2008   Past Medical History:  Diagnosis Date  . Allergic rhinitis   .  Anemia   . Anxiety   . Basal cell carcinoma of skin 2015  . BPH (benign prostatic hyperplasia)   . CAD (coronary artery disease)   . Depression   . Ganglion cyst    left thumb  . GERD (gastroesophageal reflux disease)   . HTN (hypertension)   . Hyperlipidemia   . Non Hodgkin's lymphoma ([place])   . Osteopenia   . Personal history of colonic adenomas 04/04/2010  . Scoliosis   . 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 Heart disease Mother   . Heart attack Mother 20  . Heart disease Father   . Heart attack Father 17  . Heart attack Brother 26  . Diabetes Brother   . Aneurysm Paternal Uncle   . Aneurysm Maternal Grandmother   . Heart attack Maternal Grandmother   . Aneurysm Maternal Grandfather   . Heart attack Maternal Grandfather   . Colon cancer Neg Hx   . Stomach cancer Neg Hx   . Esophageal cancer Neg Hx   . Rectal cancer Neg Hx     Past Surgical History:  Procedure Laterality Date  . CARDIAC CATHETERIZATION  07/14/2014   Procedure: LEFT HEART CATH AND CORS/GRAFTS ANGIOGRAPHY;  Surgeon: Jettie Booze, MD;  Location: [facility] CATH LAB;  Service: Cardiovascular;;  . CARDIAC CATHETERIZATION  07/14/2014   Procedure: INTRAVASCULAR PRESSURE WIRE/FFR STUDY;  Surgeon: Jettie Booze, MD;  Location: [facility] CATH LAB;  Service: Cardiovascular;;  circ  . CERVICAL SPINE SURGERY     C4-C6    . COLONOSCOPY  06/2013   negative  . CORONARY ARTERY BYPASS GRAFT    . HERNIA REPAIR    . RHINOPLASTY    . UPPER GASTROINTESTINAL ENDOSCOPY     Social History   Occupational History  . Occupation: Retired  Tobacco Use  . Smoking status: Never Smoker  . Smokeless tobacco: Never Used  Substance and Sexual Activity  . Alcohol use: No    Alcohol/week: 0.0 standard drinks  . Drug use: No  . Sexual activity: Not on file

## 2019-06-22 ENCOUNTER — Telehealth: Payer: Self-pay | Admitting: Orthopaedic Surgery

## 2019-06-22 NOTE — Telephone Encounter (Signed)
Please advise 

## 2019-06-22 NOTE — Telephone Encounter (Signed)
No fracture just ligament injury

## 2019-06-22 NOTE — Telephone Encounter (Signed)
Called patient. No answer. Left message that left hand x-ray did not show any fractures, but he did have a ligament injury. Ask him to call back if he has further questions.

## 2019-06-22 NOTE — Telephone Encounter (Signed)
Pt called in requesting xray results from 06-16-2019 wants to get confirmation if it  was broken or not.  [phone]

## 2019-07-21 DIAGNOSIS — R05 Cough: Secondary | ICD-10-CM | POA: Diagnosis not present

## 2019-07-21 DIAGNOSIS — J302 Other seasonal allergic rhinitis: Secondary | ICD-10-CM | POA: Diagnosis not present

## 2019-07-21 DIAGNOSIS — Z1152 Encounter for screening for COVID-19: Secondary | ICD-10-CM | POA: Diagnosis not present

## 2019-07-21 DIAGNOSIS — J0100 Acute maxillary sinusitis, unspecified: Secondary | ICD-10-CM | POA: Diagnosis not present

## 2019-08-02 ENCOUNTER — Ambulatory Visit: Payer: Medicare Other | Attending: Internal Medicine

## 2019-08-02 DIAGNOSIS — Z23 Encounter for immunization: Secondary | ICD-10-CM

## 2019-08-02 NOTE — Progress Notes (Signed)
   Covid-19 Vaccination Clinic  Name:  Willie Garcia    MRN: [id number] DOB: Mar 14, 1946  08/02/2019  [name] was observed post Covid-19 immunization for 15 minutes without incidence. He was provided with Vaccine Information Sheet and instruction to access the V-Safe system.   [name] was instructed to call 911 with any severe reactions post vaccine: Marland Kitchen Difficulty breathing  . Swelling of your face and throat  . A fast heartbeat  . A bad rash all over your body  . Dizziness and weakness    Immunizations Administered    Name Date Dose VIS Date Route   Pfizer COVID-19 Vaccine 08/02/2019  9:56 AM 0.3 mL 05/29/2019 Intramuscular   Manufacturer: [place]   Lot: [id number]   [place]: [id number]

## 2019-08-05 ENCOUNTER — Other Ambulatory Visit: Payer: Self-pay | Admitting: Internal Medicine

## 2019-08-25 DIAGNOSIS — L720 Epidermal cyst: Secondary | ICD-10-CM | POA: Diagnosis not present

## 2019-08-25 DIAGNOSIS — L603 Nail dystrophy: Secondary | ICD-10-CM | POA: Diagnosis not present

## 2019-08-25 DIAGNOSIS — D1801 Hemangioma of skin and subcutaneous tissue: Secondary | ICD-10-CM | POA: Diagnosis not present

## 2019-08-25 DIAGNOSIS — Z85828 Personal history of other malignant neoplasm of skin: Secondary | ICD-10-CM | POA: Diagnosis not present

## 2019-08-25 DIAGNOSIS — L570 Actinic keratosis: Secondary | ICD-10-CM | POA: Diagnosis not present

## 2019-08-25 DIAGNOSIS — L821 Other seborrheic keratosis: Secondary | ICD-10-CM | POA: Diagnosis not present

## 2019-08-26 ENCOUNTER — Ambulatory Visit: Payer: Medicare Other | Attending: Internal Medicine

## 2019-08-26 DIAGNOSIS — Z23 Encounter for immunization: Secondary | ICD-10-CM

## 2019-08-26 NOTE — Progress Notes (Signed)
   Covid-19 Vaccination Clinic  Name:  KAYLUP KNABLE    MRN: [id number] DOB: 11-29-1945  08/26/2019  Mr. Macquarrie was observed post Covid-19 immunization for 15 minutes without incident. He was provided with Vaccine Information Sheet and instruction to access the V-Safe system.   Mr. Zeron was instructed to call 911 with any severe reactions post vaccine: Marland Kitchen Difficulty breathing  . Swelling of face and throat  . A fast heartbeat  . A bad rash all over body  . Dizziness and weakness   Immunizations Administered    Name Date Dose VIS Date Route   Pfizer COVID-19 Vaccine 08/26/2019  3:41 PM 0.3 mL 05/29/2019 Intramuscular   Manufacturer: [place]   Lot: [id number]   [place]: [id number]

## 2019-09-01 DIAGNOSIS — M79645 Pain in left finger(s): Secondary | ICD-10-CM | POA: Diagnosis not present

## 2019-09-04 ENCOUNTER — Other Ambulatory Visit: Payer: Self-pay | Admitting: Internal Medicine

## 2019-09-04 DIAGNOSIS — I10 Essential (primary) hypertension: Secondary | ICD-10-CM

## 2019-09-07 DIAGNOSIS — H52203 Unspecified astigmatism, bilateral: Secondary | ICD-10-CM | POA: Diagnosis not present

## 2019-09-07 DIAGNOSIS — H524 Presbyopia: Secondary | ICD-10-CM | POA: Diagnosis not present

## 2019-09-07 DIAGNOSIS — H43813 Vitreous degeneration, bilateral: Secondary | ICD-10-CM | POA: Diagnosis not present

## 2019-09-07 DIAGNOSIS — H25813 Combined forms of age-related cataract, bilateral: Secondary | ICD-10-CM | POA: Diagnosis not present

## 2019-11-05 DIAGNOSIS — R350 Frequency of micturition: Secondary | ICD-10-CM | POA: Diagnosis not present

## 2019-11-11 DIAGNOSIS — R351 Nocturia: Secondary | ICD-10-CM | POA: Diagnosis not present

## 2019-11-11 DIAGNOSIS — N5201 Erectile dysfunction due to arterial insufficiency: Secondary | ICD-10-CM | POA: Diagnosis not present

## 2019-11-11 DIAGNOSIS — N401 Enlarged prostate with lower urinary tract symptoms: Secondary | ICD-10-CM | POA: Diagnosis not present

## 2019-11-11 DIAGNOSIS — E291 Testicular hypofunction: Secondary | ICD-10-CM | POA: Diagnosis not present

## 2019-12-29 DIAGNOSIS — W57XXXA Bitten or stung by nonvenomous insect and other nonvenomous arthropods, initial encounter: Secondary | ICD-10-CM | POA: Diagnosis not present

## 2019-12-29 DIAGNOSIS — E785 Hyperlipidemia, unspecified: Secondary | ICD-10-CM | POA: Diagnosis not present

## 2019-12-29 DIAGNOSIS — I2510 Atherosclerotic heart disease of native coronary artery without angina pectoris: Secondary | ICD-10-CM | POA: Diagnosis not present

## 2019-12-29 DIAGNOSIS — Z1331 Encounter for screening for depression: Secondary | ICD-10-CM | POA: Diagnosis not present

## 2019-12-29 DIAGNOSIS — E291 Testicular hypofunction: Secondary | ICD-10-CM | POA: Diagnosis not present

## 2019-12-29 DIAGNOSIS — K219 Gastro-esophageal reflux disease without esophagitis: Secondary | ICD-10-CM | POA: Diagnosis not present

## 2019-12-29 DIAGNOSIS — I10 Essential (primary) hypertension: Secondary | ICD-10-CM | POA: Diagnosis not present

## 2019-12-29 DIAGNOSIS — Z0000 Encounter for general adult medical examination without abnormal findings: Secondary | ICD-10-CM | POA: Diagnosis not present

## 2019-12-29 DIAGNOSIS — C8590 Non-Hodgkin lymphoma, unspecified, unspecified site: Secondary | ICD-10-CM | POA: Diagnosis not present

## 2019-12-29 DIAGNOSIS — J302 Other seasonal allergic rhinitis: Secondary | ICD-10-CM | POA: Diagnosis not present

## 2019-12-29 DIAGNOSIS — D510 Vitamin B12 deficiency anemia due to intrinsic factor deficiency: Secondary | ICD-10-CM | POA: Diagnosis not present

## 2019-12-29 DIAGNOSIS — M5030 Other cervical disc degeneration, unspecified cervical region: Secondary | ICD-10-CM | POA: Diagnosis not present

## 2019-12-29 DIAGNOSIS — F4321 Adjustment disorder with depressed mood: Secondary | ICD-10-CM | POA: Diagnosis not present

## 2019-12-30 DIAGNOSIS — Z1212 Encounter for screening for malignant neoplasm of rectum: Secondary | ICD-10-CM | POA: Diagnosis not present

## 2019-12-30 LAB — IFOBT (OCCULT BLOOD): IFOBT: NEGATIVE

## 2020-01-02 ENCOUNTER — Other Ambulatory Visit: Payer: Self-pay | Admitting: Internal Medicine

## 2020-01-05 DIAGNOSIS — M47816 Spondylosis without myelopathy or radiculopathy, lumbar region: Secondary | ICD-10-CM | POA: Diagnosis not present

## 2020-01-05 DIAGNOSIS — M419 Scoliosis, unspecified: Secondary | ICD-10-CM | POA: Diagnosis not present

## 2020-01-05 DIAGNOSIS — E7849 Other hyperlipidemia: Secondary | ICD-10-CM | POA: Diagnosis not present

## 2020-01-21 ENCOUNTER — Inpatient Hospital Stay: Payer: Medicare Other | Attending: Oncology

## 2020-01-21 ENCOUNTER — Other Ambulatory Visit: Payer: Self-pay

## 2020-01-21 DIAGNOSIS — R61 Generalized hyperhidrosis: Secondary | ICD-10-CM | POA: Diagnosis not present

## 2020-01-21 DIAGNOSIS — Z8572 Personal history of non-Hodgkin lymphomas: Secondary | ICD-10-CM | POA: Insufficient documentation

## 2020-01-21 DIAGNOSIS — R5383 Other fatigue: Secondary | ICD-10-CM | POA: Diagnosis not present

## 2020-01-21 DIAGNOSIS — Z9181 History of falling: Secondary | ICD-10-CM | POA: Insufficient documentation

## 2020-01-21 DIAGNOSIS — D649 Anemia, unspecified: Secondary | ICD-10-CM | POA: Insufficient documentation

## 2020-01-21 DIAGNOSIS — M4122 Other idiopathic scoliosis, cervical region: Secondary | ICD-10-CM

## 2020-01-21 DIAGNOSIS — C8200 Follicular lymphoma grade I, unspecified site: Secondary | ICD-10-CM

## 2020-01-21 DIAGNOSIS — I10 Essential (primary) hypertension: Secondary | ICD-10-CM

## 2020-01-21 DIAGNOSIS — C8204 Follicular lymphoma grade I, lymph nodes of axilla and upper limb: Secondary | ICD-10-CM

## 2020-01-21 LAB — CBC WITH DIFFERENTIAL/PLATELET
Abs Immature Granulocytes: 0.02 10*3/uL (ref 0.00–0.07)
Basophils Absolute: 0.1 10*3/uL (ref 0.0–0.1)
Basophils Relative: 1 %
Eosinophils Absolute: 0.3 10*3/uL (ref 0.0–0.5)
Eosinophils Relative: 4 %
HCT: 38.1 % — ABNORMAL LOW (ref 39.0–52.0)
Hemoglobin: 12.6 g/dL — ABNORMAL LOW (ref 13.0–17.0)
Immature Granulocytes: 0 %
Lymphocytes Relative: 18 %
Lymphs Abs: 1.5 10*3/uL (ref 0.7–4.0)
MCH: 31.3 pg (ref 26.0–34.0)
MCHC: 33.1 g/dL (ref 30.0–36.0)
MCV: 94.5 fL (ref 80.0–100.0)
Monocytes Absolute: 0.7 10*3/uL (ref 0.1–1.0)
Monocytes Relative: 8 %
Neutro Abs: 5.6 10*3/uL (ref 1.7–7.7)
Neutrophils Relative %: 69 %
Platelets: 211 10*3/uL (ref 150–400)
RBC: 4.03 MIL/uL — ABNORMAL LOW (ref 4.22–5.81)
RDW: 12.9 % (ref 11.5–15.5)
WBC: 8.1 10*3/uL (ref 4.0–10.5)
nRBC: 0 % (ref 0.0–0.2)

## 2020-01-21 LAB — COMPREHENSIVE METABOLIC PANEL
ALT: 9 U/L (ref 0–44)
AST: 12 U/L — ABNORMAL LOW (ref 15–41)
Albumin: 4 g/dL (ref 3.5–5.0)
Alkaline Phosphatase: 84 U/L (ref 38–126)
Anion gap: 6 (ref 5–15)
BUN: 15 mg/dL (ref 8–23)
CO2: 29 mmol/L (ref 22–32)
Calcium: 9.6 mg/dL (ref 8.9–10.3)
Chloride: 103 mmol/L (ref 98–111)
Creatinine, Ser: 1.02 mg/dL (ref 0.61–1.24)
GFR calc Af Amer: >60 mL/min (ref >60)
GFR calc non Af Amer: >60 mL/min (ref >60)
Glucose, Bld: 89 mg/dL (ref 70–99)
Potassium: 4.3 mmol/L (ref 3.5–5.1)
Sodium: 138 mmol/L (ref 135–145)
Total Bilirubin: 1.2 mg/dL (ref 0.3–1.2)
Total Protein: 6.8 g/dL (ref 6.5–8.1)

## 2020-01-21 LAB — LACTATE DEHYDROGENASE: LDH: 135 U/L (ref 98–192)

## 2020-01-22 LAB — BETA 2 MICROGLOBULIN, SERUM: Beta-2 Microglobulin: 1.5 mg/L (ref 0.6–2.4)

## 2020-01-24 NOTE — Progress Notes (Signed)
ID: Willie Garcia   DOB: [birth year]  MR#: [id number]  CSN#:[id number]  Patient Care Team: Willie Solian, MD as PCP - General (Internal Medicine) Willie Garcia, Willie Dad, MD as Consulting Physician (Oncology) Willie Mayer, MD as Consulting Physician (Gastroenterology) Willie Lance, MD as Consulting Physician (Cardiology) Willie Hakim, MD as Consulting Physician (Anesthesiology) Willie Seal, MD as Consulting Physician (Urology) OTHER MD:   CHIEF COMPLAINT: non-Hodgkin's lymphoma  CURRENT TREATMENT: observation   INTERVAL HISTORY: Willie Garcia returns today for follow-up of his low-grade follicular non-Hodgkin's B cell lymphoma accompanied by his wife Willie Garcia. He continues under observation.   He had some sweats and chills but not drenching and not constant.  He has been tired but he pretty much stays out of bed all day and is always doing something.  He is not aware of any adenopathy.  His weight is stable.  REVIEW OF SYSTEMS: Willie Garcia "took it easy" this past year.  He did a lot of projects around the house and mostly stayed in.  Both he and Willie Garcia received Willie Garcia vaccine and tolerated it fine.  He does get a lot of ticks on him because of his outside work.  Willie Garcia tells me he has been tested for Lyme and [facility] spotted fever and was negative for that.  He also received a course of doxycycline.  He fell tripping over a root, but did not break any bones in his left hand which is where he injured himself.  Detailed review of systems today was otherwise stable   HISTORY OF PRESENT ILLNESS:  from the original intake note:  Willie Garcia has long had back problems and has been followed for this by Dr. Ellene Garcia.  On November 20, 2005, the patient had an MRI of the lumbar spine without contrast for evaluation of his chronic progressive low back and left buttock pain.  This showed confluent retroperitoneal adenopathy encasing the renal vessels and measuring up to 5.2 x 3.4 cm transversely. There was no  epidural mass and the kidneys appeared unremarkable.  This had been compared with an MRI from Nov 09, 2004 where no such adenopathy was noted.    The patient's wife, Willie Garcia, is a Museum/gallery curator at [name], so she brought this to the attention of Dr. Rosana Garcia and a CT Scan of the chest, abdomen and pelvis was obtained there on June 11th.  It was ready by Willie Garcia, showing some small mediastinal lymph nodes, the largest being 1.4 cm, but significant adenopathy surrounding the left renal vein with anterior displacement of that vein as well as the inferior vena cava.  This measured 5.2 cm in transverse diameter and 3.8 cm in AP diameter.  The adenopathy extended inferiorly to the level of the aortic bifurcation.  There were small mesenteric lymph nodes, all less than 12 mm. The spleen was upper normal in size with no focal lesions.  There was no pelvic adenopathy.    His subsequent history is as detailed below   PAST MEDICAL HISTORY: Past Medical History:  Diagnosis Date  . Allergic rhinitis   . Anemia   . Anxiety   . Basal cell carcinoma of skin 2015  . BPH (benign prostatic hyperplasia)   . CAD (coronary artery disease)   . Depression   . Ganglion cyst    left thumb  . GERD (gastroesophageal reflux disease)   . HTN (hypertension)   . Hyperlipidemia   . Non Hodgkin's lymphoma ([place])   . Osteopenia   . Personal history  of colonic adenomas 04/04/2010  . Scoliosis   . 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 CARDIAC CATHETERIZATION  07/14/2014   Procedure: LEFT HEART CATH AND CORS/GRAFTS ANGIOGRAPHY;  Surgeon: Willie Booze, MD;  Location: [facility] CATH LAB;  Service: Cardiovascular;;  . CARDIAC CATHETERIZATION  07/14/2014   Procedure: INTRAVASCULAR PRESSURE WIRE/FFR STUDY;  Surgeon: Willie Booze, MD;  Location[facility] CATH LAB;  Service: Cardiovascular;;  circ  . CERVICAL SPINE SURGERY     C4-C6   . COLONOSCOPY  06/2013   negative  . CORONARY ARTERY BYPASS GRAFT    . HERNIA REPAIR    . RHINOPLASTY    . UPPER GASTROINTESTINAL ENDOSCOPY      FAMILY HISTORY Family History  Problem Relation Age of Onset  . Heart disease Mother   . Heart attack Mother 57  . Heart disease Father   . Heart attack Father 86  . Heart attack Brother 77  . Diabetes Brother   . Aneurysm Paternal Uncle   . Aneurysm Maternal Grandmother   . Heart attack Maternal Grandmother   . Aneurysm Maternal Grandfather   . Heart attack Maternal Grandfather   . Colon cancer Neg Hx   . Stomach cancer Neg Hx   . Esophageal cancer Neg Hx   . Rectal cancer Neg Hx   The patient's father died at the age of 68 from heart problems.  The patient's mother died at the age of 80 from heart problems.  The patient has three brothers and two sisters.  The only cancer in the family known to the patient is the maternal grandmother, who had breast cancer diagnosed in her fifties or 17.     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  He has been married to Willie Garcia for >30 years.  They have no children of their own. She worked for [name] until August 2018. They attend Willie Garcia. Willie Garcia    ADVANCED DIRECTIVES: in place   HEALTH MAINTENANCE: Social History   Tobacco Use  . Smoking status: Never Smoker  . Smokeless tobacco: Never Used  Vaping Use  . Vaping Use: Never used  Substance Use Topics  . Alcohol use: No    Alcohol/week: 0.0 standard drinks  . Drug use: No     Colonoscopy: 10/2018, benign Willie Garcia)  PSA: 11/2018, 1.1  Bone density:  Lipid panel:  No Known Allergies  Current Outpatient Medications  Medication Sig Dispense Refill  . acetaminophen (TYLENOL) 500 MG tablet Take 500 mg by mouth every 6 (six) hours as needed (pain).    Marland Kitchen aspirin 81 MG tablet Take 1 tablet (81 mg total) by mouth daily.    Marland Kitchen CALCIUM-VITAMIN D PO Take 1 tablet by mouth 2 (two)  times daily. (600-200)    . Cholecalciferol (VITAMIN D3) 2000 UNITS TABS Take 2,000 Units by mouth daily.    . Cyanocobalamin (VITAMIN B-12 IJ) Inject 1 mL as directed every 30 (thirty) days.     Marland Kitchen dicyclomine (BENTYL) 20 MG tablet TAKE ONE TABLET EVERY 6 HOURS AS NEEDED FOR SPASMS. 120 tablet 0  . doxycycline (ADOXA) 100 MG tablet Take 1 tablet (100 mg total) by mouth 2 (two) times daily. 14 tablet 0  . escitalopram (LEXAPRO) 10 MG tablet TAKE 1 TABLET EACH DAY. 90 tablet 0  . ezetimibe (ZETIA) 10 MG tablet Take 10 mg by mouth  daily.    . fluticasone (FLONASE) 50 MCG/ACT nasal spray Place 2 sprays into both nostrils daily as needed for allergies or rhinitis.     . folic acid (FOLVITE) 1 MG tablet Take 1 mg by mouth daily.      Marland Kitchen gabapentin (NEURONTIN) 300 MG capsule Take 1 capsule (300 mg total) by mouth at bedtime. 90 capsule 4  . loratadine (CLARITIN) 10 MG tablet Take 10 mg by mouth daily as needed for allergies.    Marland Kitchen metoCLOPramide (REGLAN) 5 MG tablet TAKE 1 TABLET BY MOUTH THREE TIMES DAILY BEFORE MEALS AS NEEDED. 270 tablet 3  . metoprolol succinate (TOPROL-XL) 25 MG 24 hr tablet TAKE 1 TABLET ONCE DAILY. 30 tablet 6  . nitroGLYCERIN (NITROSTAT) 0.4 MG SL tablet Place 1 tablet (0.4 mg total) under the tongue every 5 (five) minutes as needed for chest pain. 10 tablet 0  . pantoprazole (PROTONIX) 20 MG tablet TAKE 1 TABLET DAILY BEFORE BREAKFAST. 30 tablet 0  . rosuvastatin (CRESTOR) 20 MG tablet Take 20 mg by mouth daily.      Marland Kitchen testosterone cypionate (DEPOTESTOTERONE CYPIONATE) 200 MG/ML injection Inject 50 mg into the muscle once a week. AS DIRECTED    . vitamin C (ASCORBIC ACID) 500 MG tablet Take 500 mg by mouth daily.     No current facility-administered medications for this visit.    OBJECTIVE: Middle-aged white man in no acute distress  Vitals:   01/25/20 1304  BP: 104/67  Pulse: 66  Resp: 18  Temp: (!) 97.4 F (36.3 C)  SpO2: 97%     Body mass index is 20.61 kg/m.     ECOG FS: 1  Sclerae unicteric, EOMs intact Wearing a mask No cervical or supraclavicular adenopathy Lungs no rales or rhonchi Heart regular rate and rhythm Abd soft, nontender, positive bowel sounds MSK scoliosis but no focal spinal tenderness, no upper extremity lymphedema Neuro: nonfocal, well oriented, appropriate affect   LAB RESULTS: Lab Results  Component Value Date   WBC 8.1 01/21/2020   NEUTROABS 5.6 01/21/2020   HGB 12.6 (L) 01/21/2020   HCT 38.1 (L) 01/21/2020   MCV 94.5 01/21/2020   PLT 211 01/21/2020      Chemistry      Component Value Date/Time   NA 138 01/21/2020 1423   NA 140 01/03/2017 1033   K 4.3 01/21/2020 1423   K 4.1 01/03/2017 1033   CL 103 01/21/2020 1423   CL 101 06/30/2012 1325   CO2 29 01/21/2020 1423   CO2 28 01/03/2017 1033   BUN 15 01/21/2020 1423   BUN 17.5 01/03/2017 1033   CREATININE 1.02 01/21/2020 1423   CREATININE 1.17 01/15/2019 1138   CREATININE 1.0 01/03/2017 1033      Component Value Date/Time   CALCIUM 9.6 01/21/2020 1423   CALCIUM 9.4 01/03/2017 1033   ALKPHOS 84 01/21/2020 1423   ALKPHOS 96 01/03/2017 1033   AST 12 (L) 01/21/2020 1423   AST 14 (L) 01/15/2019 1138   AST 13 01/03/2017 1033   ALT 9 01/21/2020 1423   ALT 9 01/15/2019 1138   ALT 8 01/03/2017 1033   BILITOT 1.2 01/21/2020 1423   BILITOT 0.7 01/15/2019 1138   BILITOT 0.92 01/03/2017 1033       No results found for: LABCA2  No components found for: LABCA125  No results for input(s): INR in the last 168 hours.  Urinalysis No results found for: COLORURINE   STUDIES: No results found.   ASSESSMENT: 74  y.o. 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   PLAN:  Dontavius is  now 14 years out from his diagnosis of follicular lymphoma, with no evidence of disease activity at present.  This is very favorable.  I think he is mild fatigue and occasional sweats are not indications of disease recurrence.  We reviewed his labs in detail and they are all very reassuring.  They are concerned that Jonnathan's pain doctor Dr. Maryjean Ka is leaving town.  They are hoping his replacement will be as effective.  He has a very minimal anemia.  This is not different from a hemoglobin of 3 years ago.  I do not see any drop in the MCV so I do not think we are dealing with iron deficiency.  In brief I am delighted that Zachariah is doing so well and he will see me again in 1 year  Total encounter time 20 minutes.*   Angellica Maddison, Willie Dad, MD  01/25/20 1:35 PM Medical Oncology and Hematology [facility] [place], [place] [address] Tel. [phone]    Fax. [phone]   I, Wilburn Mylar, am acting as scribe for Dr. Virgie Garcia. Holger Sokolowski.  I, Lurline Del MD, have reviewed the above documentation for accuracy and completeness, and I agree with the above.   *Total Encounter Time as defined by the Centers for Medicare and Medicaid Services includes, in addition to the face-to-face time of a patient visit (documented in the note above) non-face-to-face time: obtaining and reviewing outside history, ordering and reviewing medications, tests or procedures, care coordination (communications with other health care professionals or caregivers) and documentation in the medical record.

## 2020-01-25 ENCOUNTER — Other Ambulatory Visit: Payer: Self-pay

## 2020-01-25 ENCOUNTER — Telehealth: Payer: Self-pay | Admitting: Oncology

## 2020-01-25 ENCOUNTER — Inpatient Hospital Stay (HOSPITAL_BASED_OUTPATIENT_CLINIC_OR_DEPARTMENT_OTHER): Payer: Medicare Other | Admitting: Oncology

## 2020-01-25 VITALS — BP 104/67 | HR 66 | Temp 97.4°F | Resp 18 | Ht 69.5 in | Wt 141.6 lb

## 2020-01-25 DIAGNOSIS — Z8572 Personal history of non-Hodgkin lymphomas: Secondary | ICD-10-CM | POA: Diagnosis not present

## 2020-01-25 DIAGNOSIS — R61 Generalized hyperhidrosis: Secondary | ICD-10-CM | POA: Diagnosis not present

## 2020-01-25 DIAGNOSIS — C8201 Follicular lymphoma grade I, lymph nodes of head, face, and neck: Secondary | ICD-10-CM | POA: Diagnosis not present

## 2020-01-25 DIAGNOSIS — R5383 Other fatigue: Secondary | ICD-10-CM | POA: Diagnosis not present

## 2020-01-25 DIAGNOSIS — D649 Anemia, unspecified: Secondary | ICD-10-CM | POA: Diagnosis not present

## 2020-01-25 DIAGNOSIS — Z9181 History of falling: Secondary | ICD-10-CM | POA: Diagnosis not present

## 2020-01-25 NOTE — Telephone Encounter (Signed)
Scheduled appts per 8/9 los. Gave pt a print out of AVS.  

## 2020-02-09 DIAGNOSIS — M419 Scoliosis, unspecified: Secondary | ICD-10-CM | POA: Diagnosis not present

## 2020-02-09 DIAGNOSIS — M4120 Other idiopathic scoliosis, site unspecified: Secondary | ICD-10-CM | POA: Diagnosis not present

## 2020-02-09 DIAGNOSIS — M47816 Spondylosis without myelopathy or radiculopathy, lumbar region: Secondary | ICD-10-CM | POA: Diagnosis not present

## 2020-02-10 ENCOUNTER — Other Ambulatory Visit: Payer: Self-pay | Admitting: Internal Medicine

## 2020-02-10 ENCOUNTER — Other Ambulatory Visit: Payer: Self-pay | Admitting: Oncology

## 2020-02-26 ENCOUNTER — Other Ambulatory Visit: Payer: Self-pay | Admitting: Internal Medicine

## 2020-03-17 DIAGNOSIS — M47816 Spondylosis without myelopathy or radiculopathy, lumbar region: Secondary | ICD-10-CM | POA: Diagnosis not present

## 2020-03-18 DIAGNOSIS — Z1152 Encounter for screening for COVID-19: Secondary | ICD-10-CM | POA: Diagnosis not present

## 2020-03-18 DIAGNOSIS — R059 Cough, unspecified: Secondary | ICD-10-CM | POA: Diagnosis not present

## 2020-03-18 DIAGNOSIS — I2510 Atherosclerotic heart disease of native coronary artery without angina pectoris: Secondary | ICD-10-CM | POA: Diagnosis not present

## 2020-04-02 DIAGNOSIS — Z23 Encounter for immunization: Secondary | ICD-10-CM | POA: Diagnosis not present

## 2020-04-04 ENCOUNTER — Ambulatory Visit: Payer: Medicare Other | Admitting: Internal Medicine

## 2020-04-14 DIAGNOSIS — R3915 Urgency of urination: Secondary | ICD-10-CM | POA: Diagnosis not present

## 2020-04-14 DIAGNOSIS — N401 Enlarged prostate with lower urinary tract symptoms: Secondary | ICD-10-CM | POA: Diagnosis not present

## 2020-04-14 DIAGNOSIS — N3000 Acute cystitis without hematuria: Secondary | ICD-10-CM | POA: Diagnosis not present

## 2020-04-21 DIAGNOSIS — N3000 Acute cystitis without hematuria: Secondary | ICD-10-CM | POA: Diagnosis not present

## 2020-05-03 DIAGNOSIS — Z23 Encounter for immunization: Secondary | ICD-10-CM | POA: Diagnosis not present

## 2020-05-04 DIAGNOSIS — E291 Testicular hypofunction: Secondary | ICD-10-CM | POA: Diagnosis not present

## 2020-05-04 DIAGNOSIS — N401 Enlarged prostate with lower urinary tract symptoms: Secondary | ICD-10-CM | POA: Diagnosis not present

## 2020-05-15 ENCOUNTER — Emergency Department (HOSPITAL_COMMUNITY): Payer: No Typology Code available for payment source

## 2020-05-15 ENCOUNTER — Encounter (HOSPITAL_COMMUNITY): Payer: Self-pay | Admitting: Emergency Medicine

## 2020-05-15 ENCOUNTER — Emergency Department (HOSPITAL_COMMUNITY)
Admission: EM | Admit: 2020-05-15 | Discharge: 2020-05-15 | Disposition: A | Discharge status: Home / Self Care | Payer: No Typology Code available for payment source | Attending: Emergency Medicine | Admitting: Emergency Medicine

## 2020-05-15 ENCOUNTER — Other Ambulatory Visit: Payer: Self-pay

## 2020-05-15 DIAGNOSIS — I10 Essential (primary) hypertension: Secondary | ICD-10-CM | POA: Diagnosis not present

## 2020-05-15 DIAGNOSIS — I2510 Atherosclerotic heart disease of native coronary artery without angina pectoris: Secondary | ICD-10-CM | POA: Diagnosis not present

## 2020-05-15 DIAGNOSIS — Z7982 Long term (current) use of aspirin: Secondary | ICD-10-CM | POA: Diagnosis not present

## 2020-05-15 DIAGNOSIS — M25512 Pain in left shoulder: Secondary | ICD-10-CM | POA: Diagnosis not present

## 2020-05-15 DIAGNOSIS — Z85828 Personal history of other malignant neoplasm of skin: Secondary | ICD-10-CM | POA: Diagnosis not present

## 2020-05-15 DIAGNOSIS — S40212A Abrasion of left shoulder, initial encounter: Secondary | ICD-10-CM | POA: Insufficient documentation

## 2020-05-15 DIAGNOSIS — M542 Cervicalgia: Secondary | ICD-10-CM | POA: Insufficient documentation

## 2020-05-15 DIAGNOSIS — Z981 Arthrodesis status: Secondary | ICD-10-CM | POA: Diagnosis not present

## 2020-05-15 DIAGNOSIS — Y92410 Unspecified street and highway as the place of occurrence of the external cause: Secondary | ICD-10-CM | POA: Diagnosis not present

## 2020-05-15 DIAGNOSIS — Z79899 Other long term (current) drug therapy: Secondary | ICD-10-CM | POA: Insufficient documentation

## 2020-05-15 DIAGNOSIS — R519 Headache, unspecified: Secondary | ICD-10-CM | POA: Diagnosis not present

## 2020-05-15 DIAGNOSIS — S4992XA Unspecified injury of left shoulder and upper arm, initial encounter: Secondary | ICD-10-CM | POA: Diagnosis present

## 2020-05-15 MED ORDER — ACETAMINOPHEN 500 MG PO TABS
1000.0000 mg | ORAL_TABLET | Freq: Once | ORAL | Status: AC
  Administered 2020-05-15: 1000 mg via ORAL
  Filled 2020-05-15: qty 2
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
## 2020-05-15 NOTE — Discharge Instructions (Addendum)
Take tylenol and/or advil for pain. You can try heat/ice, stretching, and massage for the shoulder pain.

## 2020-05-15 NOTE — ED Notes (Signed)
Pt transferred to x ray

## 2020-05-17 ENCOUNTER — Ambulatory Visit (INDEPENDENT_AMBULATORY_CARE_PROVIDER_SITE_OTHER): Payer: Medicare Other | Admitting: Internal Medicine

## 2020-05-17 ENCOUNTER — Other Ambulatory Visit: Payer: Self-pay

## 2020-05-17 ENCOUNTER — Encounter: Payer: Self-pay | Admitting: Internal Medicine

## 2020-05-17 VITALS — BP 100/64 | HR 52 | Ht 69.5 in | Wt 143.0 lb

## 2020-05-17 DIAGNOSIS — I2510 Atherosclerotic heart disease of native coronary artery without angina pectoris: Secondary | ICD-10-CM | POA: Diagnosis not present

## 2020-05-17 DIAGNOSIS — I10 Essential (primary) hypertension: Secondary | ICD-10-CM | POA: Diagnosis not present

## 2020-05-17 LAB — CBC WITH DIFFERENTIAL/PLATELET
Basophils Absolute: 0 10*3/uL (ref 0.0–0.2)
Basos: 1 %
EOS (ABSOLUTE): 0.1 10*3/uL (ref 0.0–0.4)
Eos: 1 %
Hematocrit: 38.9 % (ref 37.5–51.0)
Hemoglobin: 13.1 g/dL (ref 13.0–17.7)
Lymphocytes Absolute: 1.8 10*3/uL (ref 0.7–3.1)
Lymphs: 23 %
MCH: 31.6 pg (ref 26.6–33.0)
MCHC: 33.7 g/dL (ref 31.5–35.7)
MCV: 94 fL (ref 79–97)
Monocytes Absolute: 0.7 10*3/uL (ref 0.1–0.9)
Monocytes: 9 %
Neutrophils Absolute: 5.3 10*3/uL (ref 1.4–7.0)
Neutrophils: 66 %
Platelets: 256 10*3/uL (ref 150–450)
RBC: 4.15 x10E6/uL (ref 4.14–5.80)
RDW: 13.6 % (ref 11.6–15.4)
WBC: 7.9 10*3/uL (ref 3.4–10.8)

## 2020-05-17 LAB — BASIC METABOLIC PANEL
BUN/Creatinine Ratio: 10 (ref 10–24)
BUN: 10 mg/dL (ref 8–27)
CO2: 30 mmol/L — ABNORMAL HIGH (ref 20–29)
Calcium: 9.5 mg/dL (ref 8.6–10.2)
Chloride: 101 mmol/L (ref 96–106)
Creatinine, Ser: 0.96 mg/dL (ref 0.76–1.27)
GFR calc Af Amer: 90 mL/min/{1.73_m2} (ref >59)
GFR calc non Af Amer: 78 mL/min/{1.73_m2} (ref >59)
Glucose: 90 mg/dL (ref 65–99)
Potassium: 4.5 mmol/L (ref 3.5–5.2)
Sodium: 140 mmol/L (ref 134–144)

## 2020-05-17 MED ORDER — NITROGLYCERIN 0.4 MG SL SUBL: 0.4000 mg | SUBLINGUAL_TABLET | SUBLINGUAL | 3 refills | Status: DC | PRN

## 2020-05-17 NOTE — H&P (View-Only) (Signed)
HPI [name] returns today for followup. 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 He underwent left heart cath in 2016 which showed some graft disease and native vessel disease. He did not have angina prior to his CABG. He has c/o feeling weak with no energy, symptoms similar to what he experienced before his CABG.   No Known Allergies   Current Outpatient Medications  Medication Sig Dispense Refill  . acetaminophen (TYLENOL) 500 MG tablet Take 500 mg by mouth every 6 (six) hours as needed (pain).    Marland Kitchen aspirin 81 MG tablet Take 1 tablet (81 mg total) by mouth daily.    Marland Kitchen CALCIUM-VITAMIN D PO Take 1 tablet by mouth 2 (two) times daily. (600-200)    . Cholecalciferol (VITAMIN D3) 2000 UNITS TABS Take 2,000 Units by mouth daily.    . cyanocobalamin (,VITAMIN B-12,) 1000 MCG/ML injection Inject 1,000 mcg into the muscle every 30 (thirty) days.    Marland Kitchen dicyclomine (BENTYL) 20 MG tablet TAKE ONE TABLET EVERY 6 HOURS AS NEEDED FOR SPASMS. 120 tablet 0  . escitalopram (LEXAPRO) 10 MG tablet TAKE 1 TABLET EACH DAY. 90 tablet 0  . ezetimibe (ZETIA) 10 MG tablet Take 10 mg by mouth daily.    . fluticasone (FLONASE) 50 MCG/ACT nasal spray Place 2 sprays into both nostrils daily as needed for allergies or rhinitis.     . folic acid (FOLVITE) 1 MG tablet Take 1 mg by mouth daily.      Marland Kitchen gabapentin (NEURONTIN) 300 MG capsule Take 1 capsule (300 mg total) by mouth at bedtime. 90 capsule 4  . HYDROcodone-acetaminophen (NORCO/VICODIN) 5-325 MG tablet Take 1 tablet by mouth 2 (two) times daily as needed for pain.    Marland Kitchen loratadine (CLARITIN) 10 MG tablet Take 10 mg by mouth daily as needed for allergies.    Marland Kitchen metoCLOPramide (REGLAN) 5 MG tablet TAKE 1 TABLET THREE TIMES DAILY BEFORE MEALS AS NEEDED. 90 tablet 0  . metoprolol succinate (TOPROL-XL) 25 MG 24 hr tablet TAKE 1 TABLET ONCE DAILY. 30 tablet 6  . nitroGLYCERIN (NITROSTAT) 0.4 MG SL tablet Place 1 tablet (0.4 mg total)  under the tongue every 5 (five) minutes as needed for chest pain. 10 tablet 0  . pantoprazole (PROTONIX) 20 MG tablet TAKE 1 TABLET DAILY BEFORE BREAKFAST. 30 tablet 2  . rosuvastatin (CRESTOR) 20 MG tablet Take 20 mg by mouth daily.      Marland Kitchen testosterone cypionate (DEPOTESTOTERONE CYPIONATE) 200 MG/ML injection Inject 50 mg into the muscle once a week. AS DIRECTED    . vitamin C (ASCORBIC ACID) 500 MG tablet Take 500 mg by mouth daily.    . silodosin (RAPAFLO) 8 MG CAPS capsule Take 8 mg by mouth at bedtime.     No current facility-administered medications for this visit.     Past Medical History:  Diagnosis Date  . Allergic rhinitis   . Anemia   . Anxiety   . Basal cell carcinoma of skin 2015  . BPH (benign prostatic hyperplasia)   . CAD (coronary artery disease)   . Depression   . Ganglion cyst    left thumb  . GERD (gastroesophageal reflux disease)   . HTN (hypertension)   . Hyperlipidemia   . Non Hodgkin's lymphoma ([place])   . Osteopenia   . Personal history of colonic adenomas 04/04/2010  . Scoliosis   . 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   Past Surgical History:  Procedure Laterality Date  . CARDIAC CATHETERIZATION  07/14/2014   Procedure: LEFT HEART CATH AND CORS/GRAFTS ANGIOGRAPHY;  Surgeon: Jettie Booze, MD;  Location: [facility] CATH LAB;  Service: Cardiovascular;;  . CARDIAC CATHETERIZATION  07/14/2014   Procedure: INTRAVASCULAR PRESSURE WIRE/FFR STUDY;  Surgeon: Jettie Booze, MD;  Location: [facility] CATH LAB;  Service: Cardiovascular;;  circ  . CERVICAL SPINE SURGERY     C4-C6  . COLONOSCOPY  06/2013   negative  . CORONARY ARTERY BYPASS GRAFT    . HERNIA REPAIR    . RHINOPLASTY    . UPPER GASTROINTESTINAL ENDOSCOPY       Family History  Problem Relation Age of Onset  . Heart disease Mother   . Heart attack Mother 48  . Heart disease Father   . Heart attack Father 77  .  Heart attack Brother 36  . Diabetes Brother   . Aneurysm Paternal Uncle   . Aneurysm Maternal Grandmother   . Heart attack Maternal Grandmother   . Aneurysm Maternal Grandfather   . Heart attack Maternal Grandfather   . Colon cancer Neg Hx   . Stomach cancer Neg Hx   . Esophageal cancer Neg Hx   . Rectal cancer Neg Hx      Social History   Socioeconomic History  . Marital status: Married    Spouse name: [name]  . Number of children: 0  . Years of education: Not on file  . Highest education level: Not on file  Occupational History  . Occupation: Retired  Tobacco Use  . Smoking status: Never Smoker  . Smokeless tobacco: Never Used  Vaping Use  . Vaping Use: Never used  Substance and Sexual Activity  . Alcohol use: No    Alcohol/week: 0.0 standard drinks  . Drug use: No  . Sexual activity: Not on file  Other Topics Concern  . Not on file  Social History Narrative   Married, wife is a Marine scientist, no children   Retired Microbiologist, flies as a hobby   2 caffeinated beverages daily   Social Determinants of Radio broadcast assistant Strain:   . Difficulty of Paying Living Expenses: Not on file  Food Insecurity:   . Worried About Charity fundraiser in the Last Year: Not on file  . Ran Out of Food in the Last Year: Not on file  Transportation Needs:   . Lack of Transportation (Medical): Not on file  . Lack of Transportation (Non-Medical): Not on file  Physical Activity:   . Days of Exercise per Week: Not on file  . Minutes of Exercise per Session: Not on file  Stress:   . Feeling of Stress : Not on file  Social Connections:   . Frequency of Communication with Friends and Family: Not on file  . Frequency of Social Gatherings with Friends and Family: Not on file  . Attends Religious Services: Not on file  . Active Member of Clubs or Organizations: Not on file  . Attends Archivist Meetings: Not on file  . Marital Status: Not on file  Intimate Partner  Violence:   . Fear of Current or Ex-Partner: Not on file  . Emotionally Abused: Not on file  . Physically Abused: Not on file  . Sexually Abused: Not on file     BP 100/64  Pulse (!) 52   Ht 5' 9.5" (1.765 m)   Wt 143 lb (64.9 kg)   SpO2 98%   BMI 20.81 kg/m   Physical Exam:  Well appearing NAD HEENT: Unremarkable Neck:  No JVD, no thyromegally Lymphatics:  No adenopathy Back:  No CVA tenderness Lungs:  Clear HEART:  Regular rate rhythm, no murmurs, no rubs, no clicks Abd:  soft, positive bowel sounds, no organomegally, no rebound, no guarding Ext:  2 plus pulses, no edema, no cyanosis, no clubbing Skin:  No rashes no nodules Neuro:  CN II through XII intact, motor grossly intact  Assess/Plan: 1. CAD - he is s/p CABG. He does not have angina. He has symptoms much like he had before his CABG. I discussed stress testing as well as left heart cath and we will plan to proceed with heart cath. 2. HTN - his bp is well controlled.  3. Dyslipidemia - he will continue crestor.  Carleene Overlie Concepcion Gillott,MD

## 2020-05-17 NOTE — Patient Instructions (Addendum)
Medication Instructions:  Your physician recommends that you continue on your current medications as directed. Please refer to the Current Medication list given to you today.  Labwork: You will get lab work today:  CBC and BMP  Testing/Procedures: Your physician has requested that you have a cardiac catheterization. Cardiac catheterization is used to diagnose and/or treat various heart conditions. Doctors may recommend this procedure for a number of different reasons. The most common reason is to evaluate chest pain. Chest pain can be a symptom of coronary artery disease (CAD), and cardiac catheterization can show whether plaque is narrowing or blocking your heart's arteries. This procedure is also used to evaluate the valves, as well as measure the blood flow and oxygen levels in different parts of your heart. For further information please visit [email or web address]. Please follow instruction sheet, as given.    Willie Garcia  05/17/2020  You are scheduled for a Cardiac Catheterization on Tuesday, December 7 with Dr. Larae Grooms.  COVID TEST- May 21, 2020 at 11:00 am- This is a Drive Up Visit at [address] [place], [place] [address] Someone will direct you to the appropriate testing line. Stay in your car and someone will be with you shortly.  1. Please arrive at the [facility] (Main Entrance A) at [facility]: [address] [place], [place] [address] at 8:30 AM (This time is two hours before your procedure to ensure your preparation). Free valet parking service is available.   Special note: Every effort is made to have your procedure done on time. Please understand that emergencies sometimes delay scheduled procedures.  2. Diet: Do not eat solid foods after midnight.  The patient may have clear liquids until 5am upon the day of the procedure.  3. Labs: TODAY  4. Medication instructions in preparation for your procedure:  On the morning of your procedure, TAKE  your ASPIRIN 81 mg ONLY with a sip of water  5. Plan for one night stay--bring personal belongings. 6. Bring a current list of your medications and current insurance cards. 7. You MUST have a responsible person to drive you home. 8. Someone MUST be with you the first 24 hours after you arrive home or your discharge will be delayed. 9. Please wear clothes that are easy to get on and off and wear slip-on shoes.  Thank you for allowing [place] to care for you!   -- [facility]

## 2020-05-17 NOTE — Progress Notes (Signed)
HPI Willie Garcia returns today for followup. 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 He underwent left heart cath in 2016 which showed some graft disease and native vessel disease. He did not have angina prior to his CABG. He has c/o feeling weak with no energy, symptoms similar to what he experienced before his CABG.   No Known Allergies   Current Outpatient Medications  Medication Sig Dispense Refill  . acetaminophen (TYLENOL) 500 MG tablet Take 500 mg by mouth every 6 (six) hours as needed (pain).    Marland Kitchen aspirin 81 MG tablet Take 1 tablet (81 mg total) by mouth daily.    Marland Kitchen CALCIUM-VITAMIN D PO Take 1 tablet by mouth 2 (two) times daily. (600-200)    . Cholecalciferol (VITAMIN D3) 2000 UNITS TABS Take 2,000 Units by mouth daily.    . cyanocobalamin (,VITAMIN B-12,) 1000 MCG/ML injection Inject 1,000 mcg into the muscle every 30 (thirty) days.    Marland Kitchen dicyclomine (BENTYL) 20 MG tablet TAKE ONE TABLET EVERY 6 HOURS AS NEEDED FOR SPASMS. 120 tablet 0  . escitalopram (LEXAPRO) 10 MG tablet TAKE 1 TABLET EACH DAY. 90 tablet 0  . ezetimibe (ZETIA) 10 MG tablet Take 10 mg by mouth daily.    . fluticasone (FLONASE) 50 MCG/ACT nasal spray Place 2 sprays into both nostrils daily as needed for allergies or rhinitis.     . folic acid (FOLVITE) 1 MG tablet Take 1 mg by mouth daily.      Marland Kitchen gabapentin (NEURONTIN) 300 MG capsule Take 1 capsule (300 mg total) by mouth at bedtime. 90 capsule 4  . HYDROcodone-acetaminophen (NORCO/VICODIN) 5-325 MG tablet Take 1 tablet by mouth 2 (two) times daily as needed for pain.    Marland Kitchen loratadine (CLARITIN) 10 MG tablet Take 10 mg by mouth daily as needed for allergies.    Marland Kitchen metoCLOPramide (REGLAN) 5 MG tablet TAKE 1 TABLET THREE TIMES DAILY BEFORE MEALS AS NEEDED. 90 tablet 0  . metoprolol succinate (TOPROL-XL) 25 MG 24 hr tablet TAKE 1 TABLET ONCE DAILY. 30 tablet 6  . nitroGLYCERIN (NITROSTAT) 0.4 MG SL tablet Place 1 tablet (0.4 mg total)  under the tongue every 5 (five) minutes as needed for chest pain. 10 tablet 0  . pantoprazole (PROTONIX) 20 MG tablet TAKE 1 TABLET DAILY BEFORE BREAKFAST. 30 tablet 2  . rosuvastatin (CRESTOR) 20 MG tablet Take 20 mg by mouth daily.      Marland Kitchen testosterone cypionate (DEPOTESTOTERONE CYPIONATE) 200 MG/ML injection Inject 50 mg into the muscle once a week. AS DIRECTED    . vitamin C (ASCORBIC ACID) 500 MG tablet Take 500 mg by mouth daily.    . silodosin (RAPAFLO) 8 MG CAPS capsule Take 8 mg by mouth at bedtime.     No current facility-administered medications for this visit.     Past Medical History:  Diagnosis Date  . Allergic rhinitis   . Anemia   . Anxiety   . Basal cell carcinoma of skin 2015  . BPH (benign prostatic hyperplasia)   . CAD (coronary artery disease)   . Depression   . Ganglion cyst    left thumb  . GERD (gastroesophageal reflux disease)   . HTN (hypertension)   . Hyperlipidemia   . Non Hodgkin's lymphoma ([place])   . Osteopenia   . Personal history of colonic adenomas 04/04/2010  . Scoliosis   . 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   Past Surgical History:  Procedure Laterality Date  . CARDIAC CATHETERIZATION  07/14/2014   Procedure: LEFT HEART CATH AND CORS/GRAFTS ANGIOGRAPHY;  Surgeon: Jettie Booze, MD;  Location: [facility] CATH LAB;  Service: Cardiovascular;;  . CARDIAC CATHETERIZATION  07/14/2014   Procedure: INTRAVASCULAR PRESSURE WIRE/FFR STUDY;  Surgeon: Jettie Booze, MD;  Location: [facility] CATH LAB;  Service: Cardiovascular;;  circ  . CERVICAL SPINE SURGERY     C4-C6  . COLONOSCOPY  06/2013   negative  . CORONARY ARTERY BYPASS GRAFT    . HERNIA REPAIR    . RHINOPLASTY    . UPPER GASTROINTESTINAL ENDOSCOPY       Family History  Problem Relation Age of Onset  . Heart disease Mother   . Heart attack Mother 54  . Heart disease Father   . Heart attack Father 52  .  Heart attack Brother 60  . Diabetes Brother   . Aneurysm Paternal Uncle   . Aneurysm Maternal Grandmother   . Heart attack Maternal Grandmother   . Aneurysm Maternal Grandfather   . Heart attack Maternal Grandfather   . Colon cancer Neg Hx   . Stomach cancer Neg Hx   . Esophageal cancer Neg Hx   . Rectal cancer Neg Hx      Social History   Socioeconomic History  . Marital status: Married    Spouse name: [name]  . Number of children: 0  . Years of education: Not on file  . Highest education level: Not on file  Occupational History  . Occupation: Retired  Tobacco Use  . Smoking status: Never Smoker  . Smokeless tobacco: Never Used  Vaping Use  . Vaping Use: Never used  Substance and Sexual Activity  . Alcohol use: No    Alcohol/week: 0.0 standard drinks  . Drug use: No  . Sexual activity: Not on file  Other Topics Concern  . Not on file  Social History Narrative   Married, wife is a Marine scientist, no children   Retired Microbiologist, flies as a hobby   2 caffeinated beverages daily   Social Determinants of Radio broadcast assistant Strain:   . Difficulty of Paying Living Expenses: Not on file  Food Insecurity:   . Worried About Charity fundraiser in the Last Year: Not on file  . Ran Out of Food in the Last Year: Not on file  Transportation Needs:   . Lack of Transportation (Medical): Not on file  . Lack of Transportation (Non-Medical): Not on file  Physical Activity:   . Days of Exercise per Week: Not on file  . Minutes of Exercise per Session: Not on file  Stress:   . Feeling of Stress : Not on file  Social Connections:   . Frequency of Communication with Friends and Family: Not on file  . Frequency of Social Gatherings with Friends and Family: Not on file  . Attends Religious Services: Not on file  . Active Member of Clubs or Organizations: Not on file  . Attends Archivist Meetings: Not on file  . Marital Status: Not on file  Intimate Partner  Violence:   . Fear of Current or Ex-Partner: Not on file  . Emotionally Abused: Not on file  . Physically Abused: Not on file  . Sexually Abused: Not on file     BP 100/64  Pulse (!) 52   Ht 5' 9.5" (1.765 m)   Wt 143 lb (64.9 kg)   SpO2 98%   BMI 20.81 kg/m   Physical Exam:  Well appearing NAD HEENT: Unremarkable Neck:  No JVD, no thyromegally Lymphatics:  No adenopathy Back:  No CVA tenderness Lungs:  Clear HEART:  Regular rate rhythm, no murmurs, no rubs, no clicks Abd:  soft, positive bowel sounds, no organomegally, no rebound, no guarding Ext:  2 plus pulses, no edema, no cyanosis, no clubbing Skin:  No rashes no nodules Neuro:  CN II through XII intact, motor grossly intact  Assess/Plan: 1. CAD - he is s/p CABG. He does not have angina. He has symptoms much like he had before his CABG. I discussed stress testing as well as left heart cath and we will plan to proceed with heart cath. 2. HTN - his bp is well controlled.  3. Dyslipidemia - he will continue crestor.  Carleene Overlie Karyl Sharrar,MD

## 2020-05-21 ENCOUNTER — Other Ambulatory Visit (HOSPITAL_COMMUNITY)
Admission: RE | Admit: 2020-05-21 | Discharge: 2020-05-21 | Disposition: A | Discharge status: Home / Self Care | Payer: Medicare Other | Source: Ambulatory Visit | Attending: Interventional Cardiology | Admitting: Interventional Cardiology

## 2020-05-21 DIAGNOSIS — Z20822 Contact with and (suspected) exposure to covid-19: Secondary | ICD-10-CM | POA: Diagnosis not present

## 2020-05-21 DIAGNOSIS — Z01812 Encounter for preprocedural laboratory examination: Secondary | ICD-10-CM | POA: Diagnosis not present

## 2020-05-22 LAB — SARS CORONAVIRUS 2 (TAT 6-24 HRS): SARS Coronavirus 2: NEGATIVE

## 2020-05-23 ENCOUNTER — Telehealth: Payer: Self-pay | Admitting: *Deleted

## 2020-05-23 NOTE — Telephone Encounter (Signed)
Pt contacted pre-catheterization scheduled at [facility] for: Tuesday May 24, 2020 10:30 AM Verified arrival time and place: [facility]) at: 8:30 AM   No solid food after midnight prior to cath, clear liquids until 5 AM day of procedure.  AM meds can be  taken pre-cath with sips of water including: ASA 81 mg   Confirmed patient has responsible adult to drive home post procedure and be with patient first 24 hours after arriving home: yes  You are allowed ONE visitor in the waiting room during the time you are at the hospital for your procedure. Both you and your visitor must wear a mask once you enter the hospital.       COVID-19 Pre-Screening Questions:  . In the past 14 days have you had any symptoms concerning for COVID-19 infection (fever, chills, cough, or new shortness of breath)? no . In the past 14 days have you been around anyone with known Covid 19? No   Reviewed procedure/mask/visitor instructions, COVID-19 questions with patient's wife (DPR).

## 2020-05-24 ENCOUNTER — Other Ambulatory Visit: Payer: Self-pay

## 2020-05-24 ENCOUNTER — Encounter (HOSPITAL_COMMUNITY): Payer: Self-pay | Admitting: Interventional Cardiology

## 2020-05-24 ENCOUNTER — Ambulatory Visit (HOSPITAL_COMMUNITY)
Admission: RE | Admit: 2020-05-24 | Discharge: 2020-05-24 | Disposition: A | Discharge status: Home / Self Care | Payer: Medicare Other | Attending: Interventional Cardiology | Admitting: Interventional Cardiology

## 2020-05-24 ENCOUNTER — Encounter (HOSPITAL_COMMUNITY)
Admission: RE | Disposition: A | Discharge status: Home / Self Care | Payer: Self-pay | Source: Home / Self Care | Attending: Interventional Cardiology

## 2020-05-24 DIAGNOSIS — Z7982 Long term (current) use of aspirin: Secondary | ICD-10-CM | POA: Insufficient documentation

## 2020-05-24 DIAGNOSIS — I25118 Atherosclerotic heart disease of native coronary artery with other forms of angina pectoris: Secondary | ICD-10-CM

## 2020-05-24 DIAGNOSIS — I25119 Atherosclerotic heart disease of native coronary artery with unspecified angina pectoris: Secondary | ICD-10-CM | POA: Diagnosis not present

## 2020-05-24 DIAGNOSIS — Z951 Presence of aortocoronary bypass graft: Secondary | ICD-10-CM | POA: Diagnosis not present

## 2020-05-24 DIAGNOSIS — I25718 Atherosclerosis of autologous vein coronary artery bypass graft(s) with other forms of angina pectoris: Secondary | ICD-10-CM

## 2020-05-24 DIAGNOSIS — Z79899 Other long term (current) drug therapy: Secondary | ICD-10-CM | POA: Diagnosis not present

## 2020-05-24 DIAGNOSIS — I10 Essential (primary) hypertension: Secondary | ICD-10-CM | POA: Insufficient documentation

## 2020-05-24 DIAGNOSIS — E785 Hyperlipidemia, unspecified: Secondary | ICD-10-CM | POA: Insufficient documentation

## 2020-05-24 HISTORY — PX: LEFT HEART CATH AND CORS/GRAFTS ANGIOGRAPHY: CATH118250

## 2020-05-24 SURGERY — LEFT HEART CATH AND CORS/GRAFTS ANGIOGRAPHY
Anesthesia: LOCAL

## 2020-05-24 MED ORDER — SODIUM CHLORIDE 0.9% FLUSH: 3.0000 mL | Freq: Two times a day (BID) | INTRAVENOUS | Status: DC

## 2020-05-24 MED ORDER — SODIUM CHLORIDE 0.9 % WEIGHT BASED INFUSION
1.0000 mL/kg/h | INTRAVENOUS | Status: DC
  Administered 2020-05-24: 1 mL/kg/h via INTRAVENOUS

## 2020-05-24 MED ORDER — SODIUM CHLORIDE 0.9 % IV SOLN: 250.0000 mL | INTRAVENOUS | Status: DC | PRN

## 2020-05-24 MED ORDER — HEPARIN (PORCINE) IN NACL 1000-0.9 UT/500ML-% IV SOLN
INTRAVENOUS | Status: DC | PRN
  Administered 2020-05-24 (×2): 500 mL

## 2020-05-24 MED ORDER — SODIUM CHLORIDE 0.9 % WEIGHT BASED INFUSION
3.0000 mL/kg/h | INTRAVENOUS | Status: AC
  Administered 2020-05-24: 3 mL/kg/h via INTRAVENOUS

## 2020-05-24 MED ORDER — IOHEXOL 350 MG/ML SOLN
INTRAVENOUS | Status: DC | PRN
  Administered 2020-05-24: 90 mL

## 2020-05-24 MED ORDER — MIDAZOLAM HCL 2 MG/2ML IJ SOLN
INTRAMUSCULAR | Status: DC | PRN
  Administered 2020-05-24: 2 mg via INTRAVENOUS

## 2020-05-24 MED ORDER — ASPIRIN 81 MG PO CHEW: 81.0000 mg | CHEWABLE_TABLET | ORAL | Status: DC

## 2020-05-24 MED ORDER — MIDAZOLAM HCL 2 MG/2ML IJ SOLN
INTRAMUSCULAR | Status: AC
  Filled 2020-05-24: qty 2

## 2020-05-24 MED ORDER — LIDOCAINE HCL (PF) 1 % IJ SOLN
INTRAMUSCULAR | Status: AC
  Filled 2020-05-24: qty 30

## 2020-05-24 MED ORDER — FENTANYL CITRATE (PF) 100 MCG/2ML IJ SOLN
INTRAMUSCULAR | Status: DC | PRN
  Administered 2020-05-24: 25 ug via INTRAVENOUS

## 2020-05-24 MED ORDER — HEPARIN (PORCINE) IN NACL 1000-0.9 UT/500ML-% IV SOLN
INTRAVENOUS | Status: AC
  Filled 2020-05-24: qty 1000

## 2020-05-24 MED ORDER — FENTANYL CITRATE (PF) 100 MCG/2ML IJ SOLN
INTRAMUSCULAR | Status: AC
  Filled 2020-05-24: qty 2

## 2020-05-24 MED ORDER — LIDOCAINE HCL (PF) 1 % IJ SOLN
INTRAMUSCULAR | Status: DC | PRN
  Administered 2020-05-24: 10 mL

## 2020-05-24 MED ORDER — SODIUM CHLORIDE 0.9% FLUSH: 3.0000 mL | INTRAVENOUS | Status: DC | PRN

## 2020-05-24 SURGICAL SUPPLY — 8 items
CATH INFINITI 5FR MULTPACK ANG (CATHETERS) ×1 IMPLANT
CLOSURE MYNX CONTROL 5F (Vascular Products) ×1 IMPLANT
KIT HEART LEFT (KITS) ×2 IMPLANT
PACK CARDIAC CATHETERIZATION (CUSTOM PROCEDURE TRAY) ×2 IMPLANT
SHEATH PINNACLE 5F 10CM (SHEATH) ×1 IMPLANT
SHEATH PROBE COVER 6X72 (BAG) ×1 IMPLANT
TRANSDUCER W/STOPCOCK (MISCELLANEOUS) ×2 IMPLANT
WIRE EMERALD 3MM-J .035X150CM (WIRE) ×2 IMPLANT

## 2020-05-24 NOTE — Discharge Instructions (Signed)
Femoral Site Care This sheet gives you information about how to care for yourself after your procedure. Your health care provider may also give you more specific instructions. If you have problems or questions, contact your health care provider. What can I expect after the procedure? After the procedure, it is common to have:  Bruising that usually fades within 1-2 weeks.  Tenderness at the site. Follow these instructions at home: Wound care  Follow instructions from your health care provider about how to take care of your insertion site. Make sure you: ? Wash your hands with soap and water before you change your bandage (dressing). If soap and water are not available, use hand sanitizer. ? Change your dressing as told by your health care provider. ? Leave stitches (sutures), skin glue, or adhesive strips in place. These skin closures may need to stay in place for 2 weeks or longer. If adhesive strip edges start to loosen and curl up, you may trim the loose edges. Do not remove adhesive strips completely unless your health care provider tells you to do that.  Do not take baths, swim, or use a hot tub until your health care provider approves.  You may shower 24-48 hours after the procedure or as told by your health care provider. ? Gently wash the site with plain soap and water. ? Pat the area dry with a clean towel. ? Do not rub the site. This may cause bleeding.  Do not apply powder or lotion to the site. Keep the site clean and dry.  Check your femoral site every day for signs of infection. Check for: ? Redness, swelling, or pain. ? Fluid or blood. ? Warmth. ? Pus or a bad smell. Activity  For the first 2-3 days after your procedure, or as long as directed: ? Avoid climbing stairs as much as possible. ? Do not squat.  Do not lift anything that is heavier than 10 lb (4.5 kg), or the limit that you are told, until your health care provider says that it is safe.  Rest as  directed. ? Avoid sitting for a long time without moving. Get up to take short walks every 1-2 hours.  Do not drive for 24 hours if you were given a medicine to help you relax (sedative). General instructions  Take over-the-counter and prescription medicines only as told by your health care provider.  Keep all follow-up visits as told by your health care provider. This is important. Contact a health care provider if you have:  A fever or chills.  You have redness, swelling, or pain around your insertion site. Get help right away if:  The catheter insertion area swells very fast.  You pass out.  You suddenly start to sweat or your skin gets clammy.  The catheter insertion area is bleeding, and the bleeding does not stop when you hold steady pressure on the area.  The area near or just beyond the catheter insertion site becomes pale, cool, tingly, or numb. These symptoms may represent a serious problem that is an emergency. Do not wait to see if the symptoms will go away. Get medical help right away. Call your local emergency services (911 in the [place] Do not drive yourself to the hospital. Summary  After the procedure, it is common to have bruising that usually fades within 1-2 weeks.  Check your femoral site every day for signs of infection.  Do not lift anything that is heavier than 10 lb (4.5 kg), or the   limit that you are told, until your health care provider says that it is safe. This information is not intended to replace advice given to you by your health care provider. Make sure you discuss any questions you have with your health care provider. Document Revised: 06/17/2017 Document Reviewed: 06/17/2017 Elsevier Patient Education  2020 Elsevier Inc.  

## 2020-05-24 NOTE — Interval H&P Note (Signed)
Cath Lab Visit (complete for each Cath Lab visit)  Clinical Evaluation Leading to the Procedure:   ACS: No.  Non-ACS:    Anginal Classification: CCS III  Anti-ischemic medical therapy: Minimal Therapy (1 class of medications)  Non-Invasive Test Results: No non-invasive testing performed  Prior CABG: Previous CABG      History and Physical Interval Note:  05/24/2020 10:52 AM  Willie Garcia  has presented today for surgery, with the diagnosis of chest pain.  The various methods of treatment have been discussed with the patient and family. After consideration of risks, benefits and other options for treatment, the patient has consented to  Procedure(s): LEFT HEART CATH AND CORS/GRAFTS ANGIOGRAPHY (N/A) as a surgical intervention.  The patient's history has been reviewed, patient examined, no change in status, stable for surgery.  I have reviewed the patient's chart and labs.  Questions were answered to the patient's satisfaction.     Larae Grooms

## 2020-05-24 NOTE — Progress Notes (Signed)
Discharge instructions reviewed with pt and his wife (via telephone) both voice understanding. Ambulated in hallway tol well no bleeding noted before or after ambulation.

## 2020-05-30 ENCOUNTER — Telehealth: Payer: Self-pay | Admitting: Internal Medicine

## 2020-05-30 DIAGNOSIS — E291 Testicular hypofunction: Secondary | ICD-10-CM | POA: Diagnosis not present

## 2020-05-30 DIAGNOSIS — N3000 Acute cystitis without hematuria: Secondary | ICD-10-CM | POA: Diagnosis not present

## 2020-05-30 DIAGNOSIS — R3912 Poor urinary stream: Secondary | ICD-10-CM | POA: Diagnosis not present

## 2020-05-30 DIAGNOSIS — N401 Enlarged prostate with lower urinary tract symptoms: Secondary | ICD-10-CM | POA: Diagnosis not present

## 2020-05-30 NOTE — Telephone Encounter (Signed)
Patient sees Dr. Lovena Le, patients wife is calling with concerns about the recent heart cath and has questions. Please call.

## 2020-05-30 NOTE — Telephone Encounter (Signed)
Returned call to wife.  Wife has questions about results of cardiac cath.  Advised will discuss with Dr. Lovena Le tomorrow and return her call.

## 2020-05-31 ENCOUNTER — Telehealth: Payer: Self-pay | Admitting: *Deleted
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
## 2020-06-01 MED ORDER — ISOSORBIDE MONONITRATE ER 30 MG PO TB24: 15.0000 mg | ORAL_TABLET | Freq: Every day | ORAL | 0 refills | Status: DC

## 2020-06-01 NOTE — Telephone Encounter (Signed)
Left message requesting  call back.

## 2020-06-01 NOTE — Telephone Encounter (Signed)
Returned call to Pt's wife.  Advised per cath report review by Dr. Jarold Motto has 4 out of 5 grafts open.  Per Dr. Lovena Le Pt may try Imdur 15 mg one tablet by mouth daily to see if this improves his fatigue/sob.  If after a few weeks Pt does not feel like this is helping he should stop.  Per wife he was just prescribed Cialis 5 mg for BPH.  Wife will have Pt wait and try Imdur first.

## 2020-06-01 NOTE — Telephone Encounter (Signed)
Pt's wife returning call.

## 2020-06-14 ENCOUNTER — Other Ambulatory Visit: Payer: Self-pay

## 2020-06-14 ENCOUNTER — Ambulatory Visit (INDEPENDENT_AMBULATORY_CARE_PROVIDER_SITE_OTHER): Payer: Medicare Other | Admitting: Internal Medicine

## 2020-06-14 ENCOUNTER — Encounter: Payer: Self-pay | Admitting: Internal Medicine

## 2020-06-14 ENCOUNTER — Other Ambulatory Visit: Payer: Self-pay | Admitting: Oncology

## 2020-06-14 VITALS — BP 98/60 | HR 57 | Ht 69.5 in | Wt 145.2 lb

## 2020-06-14 DIAGNOSIS — E785 Hyperlipidemia, unspecified: Secondary | ICD-10-CM | POA: Diagnosis not present

## 2020-06-14 DIAGNOSIS — I2510 Atherosclerotic heart disease of native coronary artery without angina pectoris: Secondary | ICD-10-CM

## 2020-06-14 DIAGNOSIS — I10 Essential (primary) hypertension: Secondary | ICD-10-CM | POA: Diagnosis not present

## 2020-06-14 NOTE — Patient Instructions (Signed)

## 2020-06-14 NOTE — Progress Notes (Signed)
HPI Willie Garcia returns today after undergoing left heart cath a couple of weeks ago for worsening dyspnea with exertion. He is a pleasant 74 yo man with CAD, s/p CABG in his early 90's for severe 3 vessel CAD. He has preserved LV function. He does not have typical chest pain. He was placed on imdur and returns today for followup. He denies chest pain or sob. No syncope. No edema.  No Known Allergies   Current Outpatient Medications  Medication Sig Dispense Refill  . acetaminophen (TYLENOL) 500 MG tablet Take 500 mg by mouth every 6 (six) hours as needed (pain).    . Ascorbic Acid (VITAMIN C) 1000 MG tablet Take 1,000 mg by mouth daily.    Marland Kitchen aspirin EC 81 MG tablet Take 81 mg by mouth every Monday, Tuesday, Wednesday, Thursday, and Friday. Swallow whole.    Marland Kitchen CALCIUM-VITAMIN D PO Take 1 tablet by mouth 3 (three) times a week.     . cyanocobalamin (,VITAMIN B-12,) 1000 MCG/ML injection Inject 1,000 mcg into the muscle every 30 (thirty) days.    Marland Kitchen dicyclomine (BENTYL) 20 MG tablet TAKE ONE TABLET EVERY 6 HOURS AS NEEDED FOR SPASMS. (Patient taking differently: Take 20 mg by mouth every 6 (six) hours as needed (stomach spasms.).) 120 tablet 0  . ezetimibe (ZETIA) 10 MG tablet Take 10 mg by mouth 3 (three) times a week.     . fluticasone (FLONASE) 50 MCG/ACT nasal spray Place 2 sprays into both nostrils daily as needed for allergies or rhinitis.     . folic acid (FOLVITE) 1 MG tablet Take 1 mg by mouth 3 (three) times a week.    . gabapentin (NEURONTIN) 300 MG capsule Take 1 capsule (300 mg total) by mouth at bedtime. (Patient taking differently: Take 300 mg by mouth at bedtime as needed (nerve pain.).) 90 capsule 4  . HYDROcodone-acetaminophen (NORCO/VICODIN) 5-325 MG tablet Take 1 tablet by mouth 2 (two) times daily as needed (back pain.).     Marland Kitchen isosorbide mononitrate (IMDUR) 30 MG 24 hr tablet Take 0.5 tablets (15 mg total) by mouth daily. 15 tablet 0  . loratadine (CLARITIN) 10 MG tablet  Take 10 mg by mouth daily as needed for allergies.    Marland Kitchen metoCLOPramide (REGLAN) 5 MG tablet TAKE 1 TABLET THREE TIMES DAILY BEFORE MEALS AS NEEDED. (Patient taking differently: Take 5 mg by mouth 3 (three) times daily as needed (nausea/poor food digestion). \) 90 tablet 0  . metoprolol succinate (TOPROL-XL) 25 MG 24 hr tablet TAKE 1 TABLET ONCE DAILY. (Patient taking differently: Take 25 mg by mouth every evening.) 30 tablet 6  . nitroGLYCERIN (NITROSTAT) 0.4 MG SL tablet Place 1 tablet (0.4 mg total) under the tongue every 5 (five) minutes as needed for chest pain. 30 tablet 3  . pantoprazole (PROTONIX) 20 MG tablet TAKE 1 TABLET DAILY BEFORE BREAKFAST. (Patient taking differently: Take 20 mg by mouth 3 (three) times a week.) 30 tablet 2  . rosuvastatin (CRESTOR) 20 MG tablet Take 20 mg by mouth 2 (two) times a week.    . silodosin (RAPAFLO) 8 MG CAPS capsule Take 8 mg by mouth at bedtime as needed (difficulty urinating).     Marland Kitchen testosterone cypionate (DEPOTESTOTERONE CYPIONATE) 200 MG/ML injection Inject 50 mg into the muscle once a week. AS DIRECTED (0.25 ml)     No current facility-administered medications for this visit.     Past Medical History:  Diagnosis Date  . Allergic rhinitis   .  Anemia   . Anxiety   . Basal cell carcinoma of skin 2015  . BPH (benign prostatic hyperplasia)   . CAD (coronary artery disease)   . Depression   . Ganglion cyst    left thumb  . GERD (gastroesophageal reflux disease)   . HTN (hypertension)   . Hyperlipidemia   . Non Hodgkin's lymphoma ([place])   . Osteopenia   . Personal history of colonic adenomas 04/04/2010  . Scoliosis   . 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   Past Surgical History:  Procedure Laterality Date  . CARDIAC CATHETERIZATION  07/14/2014   Procedure: LEFT HEART CATH AND CORS/GRAFTS ANGIOGRAPHY;  Surgeon: Jettie Booze, MD;  Location: [facility] CATH  LAB;  Service: Cardiovascular;;  . CARDIAC CATHETERIZATION  07/14/2014   Procedure: INTRAVASCULAR PRESSURE WIRE/FFR STUDY;  Surgeon: Jettie Booze, MD;  Location: [facility] CATH LAB;  Service: Cardiovascular;;  circ  . CERVICAL SPINE SURGERY     C4-C6  . COLONOSCOPY  06/2013   negative  . CORONARY ARTERY BYPASS GRAFT    . HERNIA REPAIR    . LEFT HEART CATH AND CORS/GRAFTS ANGIOGRAPHY N/A 05/24/2020   Procedure: LEFT HEART CATH AND CORS/GRAFTS ANGIOGRAPHY;  Surgeon: Jettie Booze, MD;  Location: [place] [facility] CV LAB;  Service: Cardiovascular;  Laterality: N/A;  . RHINOPLASTY    . UPPER GASTROINTESTINAL ENDOSCOPY       Family History  Problem Relation Age of Onset  . Heart disease Mother   . Heart attack Mother 35  . Heart disease Father   . Heart attack Father 52  . Heart attack Brother 68  . Diabetes Brother   . Aneurysm Paternal Uncle   . Aneurysm Maternal Grandmother   . Heart attack Maternal Grandmother   . Aneurysm Maternal Grandfather   . Heart attack Maternal Grandfather   . Colon cancer Neg Hx   . Stomach cancer Neg Hx   . Esophageal cancer Neg Hx   . Rectal cancer Neg Hx      Social History   Socioeconomic History  . Marital status: Married    Spouse name: [name]  . Number of children: 0  . Years of education: Not on file  . Highest education level: Not on file  Occupational History  . Occupation: Retired  Tobacco Use  . Smoking status: Never Smoker  . Smokeless tobacco: Never Used  Vaping Use  . Vaping Use: Never used  Substance and Sexual Activity  . Alcohol use: No    Alcohol/week: 0.0 standard drinks  . Drug use: No  . Sexual activity: Not on file  Other Topics Concern  . Not on file  Social History Narrative   Married, wife is a Marine scientist, no children   Retired Microbiologist, flies as a hobby   2 caffeinated beverages daily   Social Determinants of Sales executive: Not on Art therapist Insecurity: Not on file   Transportation Needs: Not on file  Physical Activity: Not on file  Stress: Not on file  Social Connections: Not on file  Intimate Partner Violence: Not on file     BP 98/60   Pulse (!) 57   Ht 5' 9.5" (1.765 m)   Wt 145 lb 3.2 oz (65.9 kg)   SpO2 96%   BMI 21.13 kg/m   Physical Exam:  Well appearing NAD  HEENT: Unremarkable Neck:  No JVD, no thyromegally Lymphatics:  No adenopathy Back:  No CVA tenderness Lungs:  Clear with no wheezes HEART:  Regular rate rhythm, no murmurs, no rubs, no clicks Abd:  soft, positive bowel sounds, no organomegally, no rebound, no guarding Ext:  2 plus pulses, no edema, no cyanosis, no clubbing Skin:  No rashes no nodules Neuro:  CN II through XII intact, motor grossly intact   Assess/Plan: 1. CAD - he is s/p CABG. He does not have angina. He has undergone left heart cath demonstrating 3/4 grafts patient. He will continue aggressive medical therapy. I encouraged him to start walking. 2. HTN - his bp is well controlled.  3. Dyslipidemia - he will continue crestor.  Sharlot Gowda Savaya Hakes,MD

## 2020-06-30 DIAGNOSIS — N3000 Acute cystitis without hematuria: Secondary | ICD-10-CM | POA: Diagnosis not present

## 2020-07-15 ENCOUNTER — Other Ambulatory Visit: Payer: Self-pay | Admitting: Internal Medicine

## 2020-08-13 ENCOUNTER — Other Ambulatory Visit: Payer: Self-pay | Admitting: Internal Medicine

## 2020-08-13 DIAGNOSIS — I10 Essential (primary) hypertension: Secondary | ICD-10-CM

## 2020-08-16 DIAGNOSIS — H6121 Impacted cerumen, right ear: Secondary | ICD-10-CM | POA: Diagnosis not present

## 2020-08-16 DIAGNOSIS — K219 Gastro-esophageal reflux disease without esophagitis: Secondary | ICD-10-CM | POA: Diagnosis not present

## 2020-08-16 DIAGNOSIS — L299 Pruritus, unspecified: Secondary | ICD-10-CM | POA: Diagnosis not present

## 2020-08-16 DIAGNOSIS — H938X1 Other specified disorders of right ear: Secondary | ICD-10-CM | POA: Diagnosis not present

## 2020-08-29 DIAGNOSIS — M47816 Spondylosis without myelopathy or radiculopathy, lumbar region: Secondary | ICD-10-CM | POA: Diagnosis not present

## 2020-09-10 ENCOUNTER — Other Ambulatory Visit: Payer: Self-pay | Admitting: Internal Medicine

## 2020-09-26 DIAGNOSIS — H43813 Vitreous degeneration, bilateral: Secondary | ICD-10-CM | POA: Diagnosis not present

## 2020-09-26 DIAGNOSIS — H524 Presbyopia: Secondary | ICD-10-CM | POA: Diagnosis not present

## 2020-09-26 DIAGNOSIS — H25813 Combined forms of age-related cataract, bilateral: Secondary | ICD-10-CM | POA: Diagnosis not present

## 2020-09-26 DIAGNOSIS — H52203 Unspecified astigmatism, bilateral: Secondary | ICD-10-CM | POA: Diagnosis not present

## 2020-10-17 DIAGNOSIS — Z85828 Personal history of other malignant neoplasm of skin: Secondary | ICD-10-CM | POA: Diagnosis not present

## 2020-10-17 DIAGNOSIS — L570 Actinic keratosis: Secondary | ICD-10-CM | POA: Diagnosis not present

## 2020-10-17 DIAGNOSIS — D1801 Hemangioma of skin and subcutaneous tissue: Secondary | ICD-10-CM | POA: Diagnosis not present

## 2020-10-17 DIAGNOSIS — L720 Epidermal cyst: Secondary | ICD-10-CM | POA: Diagnosis not present

## 2020-10-17 DIAGNOSIS — L821 Other seborrheic keratosis: Secondary | ICD-10-CM | POA: Diagnosis not present

## 2020-10-29 DIAGNOSIS — Z23 Encounter for immunization: Secondary | ICD-10-CM | POA: Diagnosis not present

## 2020-11-21 ENCOUNTER — Other Ambulatory Visit: Payer: Self-pay

## 2020-11-21 DIAGNOSIS — I10 Essential (primary) hypertension: Secondary | ICD-10-CM

## 2020-11-21 MED ORDER — METOPROLOL SUCCINATE ER 25 MG PO TB24: 1.0000 | ORAL_TABLET | Freq: Every day | ORAL | 1 refills | Status: DC

## 2020-11-22 DIAGNOSIS — E291 Testicular hypofunction: Secondary | ICD-10-CM | POA: Diagnosis not present

## 2020-11-22 DIAGNOSIS — N401 Enlarged prostate with lower urinary tract symptoms: Secondary | ICD-10-CM | POA: Diagnosis not present

## 2020-12-16 DIAGNOSIS — M47816 Spondylosis without myelopathy or radiculopathy, lumbar region: Secondary | ICD-10-CM | POA: Diagnosis not present

## 2020-12-16 DIAGNOSIS — M4126 Other idiopathic scoliosis, lumbar region: Secondary | ICD-10-CM | POA: Diagnosis not present

## 2020-12-16 DIAGNOSIS — M4120 Other idiopathic scoliosis, site unspecified: Secondary | ICD-10-CM | POA: Diagnosis not present

## 2020-12-23 ENCOUNTER — Other Ambulatory Visit: Payer: Self-pay | Admitting: Neurosurgery

## 2020-12-23 DIAGNOSIS — M4126 Other idiopathic scoliosis, lumbar region: Secondary | ICD-10-CM

## 2020-12-28 ENCOUNTER — Ambulatory Visit
Admission: RE | Admit: 2020-12-28 | Discharge: 2020-12-28 | Disposition: A | Discharge status: Home / Self Care | Payer: Medicare Other | Source: Ambulatory Visit | Attending: Neurosurgery | Admitting: Neurosurgery

## 2020-12-28 ENCOUNTER — Other Ambulatory Visit: Payer: Self-pay

## 2020-12-28 DIAGNOSIS — M4126 Other idiopathic scoliosis, lumbar region: Secondary | ICD-10-CM

## 2020-12-28 DIAGNOSIS — M545 Low back pain, unspecified: Secondary | ICD-10-CM | POA: Diagnosis not present

## 2020-12-28 DIAGNOSIS — M48061 Spinal stenosis, lumbar region without neurogenic claudication: Secondary | ICD-10-CM | POA: Diagnosis not present

## 2021-01-02 ENCOUNTER — Other Ambulatory Visit: Payer: Self-pay | Admitting: Neurosurgery

## 2021-01-02 DIAGNOSIS — M4126 Other idiopathic scoliosis, lumbar region: Secondary | ICD-10-CM

## 2021-01-17 ENCOUNTER — Other Ambulatory Visit: Payer: Medicare Other

## 2021-01-18 ENCOUNTER — Inpatient Hospital Stay: Payer: Medicare Other | Attending: Oncology

## 2021-01-18 ENCOUNTER — Other Ambulatory Visit: Payer: Self-pay

## 2021-01-18 DIAGNOSIS — Z8572 Personal history of non-Hodgkin lymphomas: Secondary | ICD-10-CM | POA: Insufficient documentation

## 2021-01-18 DIAGNOSIS — C8204 Follicular lymphoma grade I, lymph nodes of axilla and upper limb: Secondary | ICD-10-CM

## 2021-01-18 DIAGNOSIS — M4126 Other idiopathic scoliosis, lumbar region: Secondary | ICD-10-CM | POA: Diagnosis not present

## 2021-01-18 DIAGNOSIS — G8929 Other chronic pain: Secondary | ICD-10-CM | POA: Diagnosis not present

## 2021-01-18 DIAGNOSIS — M4122 Other idiopathic scoliosis, cervical region: Secondary | ICD-10-CM

## 2021-01-18 DIAGNOSIS — C8200 Follicular lymphoma grade I, unspecified site: Secondary | ICD-10-CM

## 2021-01-18 DIAGNOSIS — I10 Essential (primary) hypertension: Secondary | ICD-10-CM

## 2021-01-18 DIAGNOSIS — M5416 Radiculopathy, lumbar region: Secondary | ICD-10-CM | POA: Diagnosis not present

## 2021-01-18 DIAGNOSIS — M48061 Spinal stenosis, lumbar region without neurogenic claudication: Secondary | ICD-10-CM | POA: Diagnosis not present

## 2021-01-18 LAB — CBC WITH DIFFERENTIAL/PLATELET
Abs Immature Granulocytes: 0.02 10*3/uL (ref 0.00–0.07)
Basophils Absolute: 0.1 10*3/uL (ref 0.0–0.1)
Basophils Relative: 1 %
Eosinophils Absolute: 0.3 10*3/uL (ref 0.0–0.5)
Eosinophils Relative: 3 %
HCT: 36.3 % — ABNORMAL LOW (ref 39.0–52.0)
Hemoglobin: 12.3 g/dL — ABNORMAL LOW (ref 13.0–17.0)
Immature Granulocytes: 0 %
Lymphocytes Relative: 22 %
Lymphs Abs: 1.8 10*3/uL (ref 0.7–4.0)
MCH: 31.2 pg (ref 26.0–34.0)
MCHC: 33.9 g/dL (ref 30.0–36.0)
MCV: 92.1 fL (ref 80.0–100.0)
Monocytes Absolute: 0.7 10*3/uL (ref 0.1–1.0)
Monocytes Relative: 9 %
Neutro Abs: 5.2 10*3/uL (ref 1.7–7.7)
Neutrophils Relative %: 65 %
Platelets: 201 10*3/uL (ref 150–400)
RBC: 3.94 MIL/uL — ABNORMAL LOW (ref 4.22–5.81)
RDW: 12.3 % (ref 11.5–15.5)
WBC: 8.1 10*3/uL (ref 4.0–10.5)
nRBC: 0 % (ref 0.0–0.2)

## 2021-01-18 LAB — COMPREHENSIVE METABOLIC PANEL
ALT: 10 U/L (ref 0–44)
AST: 12 U/L — ABNORMAL LOW (ref 15–41)
Albumin: 3.8 g/dL (ref 3.5–5.0)
Alkaline Phosphatase: 75 U/L (ref 38–126)
Anion gap: 8 (ref 5–15)
BUN: 12 mg/dL (ref 8–23)
CO2: 28 mmol/L (ref 22–32)
Calcium: 9.4 mg/dL (ref 8.9–10.3)
Chloride: 103 mmol/L (ref 98–111)
Creatinine, Ser: 1.07 mg/dL (ref 0.61–1.24)
GFR, Estimated: >60 mL/min (ref >60)
Glucose, Bld: 88 mg/dL (ref 70–99)
Potassium: 4.4 mmol/L (ref 3.5–5.1)
Sodium: 139 mmol/L (ref 135–145)
Total Bilirubin: 0.8 mg/dL (ref 0.3–1.2)
Total Protein: 6.5 g/dL (ref 6.5–8.1)

## 2021-01-18 LAB — LACTATE DEHYDROGENASE: LDH: 131 U/L (ref 98–192)

## 2021-01-19 LAB — BETA 2 MICROGLOBULIN, SERUM: Beta-2 Microglobulin: 1.9 mg/L (ref 0.6–2.4)

## 2021-01-23 DIAGNOSIS — Z87440 Personal history of urinary (tract) infections: Secondary | ICD-10-CM | POA: Diagnosis not present

## 2021-01-23 DIAGNOSIS — E291 Testicular hypofunction: Secondary | ICD-10-CM | POA: Diagnosis not present

## 2021-01-23 DIAGNOSIS — N5201 Erectile dysfunction due to arterial insufficiency: Secondary | ICD-10-CM | POA: Diagnosis not present

## 2021-01-23 DIAGNOSIS — R351 Nocturia: Secondary | ICD-10-CM | POA: Diagnosis not present

## 2021-01-23 DIAGNOSIS — N401 Enlarged prostate with lower urinary tract symptoms: Secondary | ICD-10-CM | POA: Diagnosis not present

## 2021-01-24 ENCOUNTER — Encounter: Payer: Self-pay | Admitting: Oncology

## 2021-01-24 ENCOUNTER — Inpatient Hospital Stay (HOSPITAL_BASED_OUTPATIENT_CLINIC_OR_DEPARTMENT_OTHER): Payer: Medicare Other | Admitting: Oncology

## 2021-01-24 ENCOUNTER — Other Ambulatory Visit: Payer: Self-pay

## 2021-01-24 VITALS — BP 100/52 | HR 57 | Temp 97.5°F | Resp 18 | Ht 69.5 in | Wt 141.9 lb

## 2021-01-24 DIAGNOSIS — G8929 Other chronic pain: Secondary | ICD-10-CM | POA: Diagnosis not present

## 2021-01-24 DIAGNOSIS — C8201 Follicular lymphoma grade I, lymph nodes of head, face, and neck: Secondary | ICD-10-CM

## 2021-01-24 DIAGNOSIS — Z8572 Personal history of non-Hodgkin lymphomas: Secondary | ICD-10-CM | POA: Diagnosis not present

## 2021-01-24 NOTE — Progress Notes (Signed)
ID: Willie Garcia   DOB: 12-23-1945  MR#: [id number]  CSN#:[id number]  Patient Care Team: Willie Solian, MD as PCP - General (Internal Medicine) Willie Garcia, Willie Dad, MD as Consulting Physician (Oncology) [name], MD as Consulting Physician (Gastroenterology) [name], MD as Consulting Physician (Cardiology) [name], MD (Inactive) as Consulting Physician (Anesthesiology) [name], MD as Consulting Physician (Urology) [name], MD as Consulting Physician (Pain Medicine) OTHER MD:   CHIEF COMPLAINT: non-Hodgkin's lymphoma  CURRENT TREATMENT: observation   INTERVAL HISTORY: Willie Garcia returns today for follow-up of his low-grade follicular non-Hodgkin's B cell lymphoma accompanied by his wife Willie Garcia. He continues under observation.   From a cardiac point of view recall he is status post CABG remotely.  Repeat cardiac cath 05/24/2020 showed 3 of the 4 grafts are patent and he continues on medical therapy.  To evaluate his lower back pain which radiated into the right hip and leg he had a noncontrast MRI of the lumbar spine 12/28/2020--this showed significant degenerative disc disease with progression as compared to 2016.     REVIEW OF SYSTEMS: Willie Garcia takes occasional Tylenol and rarely hydrocodone for his pain.  He has not been able to tolerate nonsteroidals very well.  He does see Willie Garcia in Dr. Clarice Garcia office and he gives some epidural shots which do help.  Willie Garcia tries to be as active as he can.  He has had no fevers.  He has had some sweats and chills.  He is not aware of any adenopathy.  His weight is up and down but there is no clear downward trend.  Detailed review of systems today was otherwise stable   COVID 19 VACCINATION STATUS: Pfizer x2 followed by booster x2   HISTORY OF PRESENT ILLNESS:  from the original intake note:  Willie Garcia has long had back problems and has been followed for this by Dr. Ellene Garcia.  On November 20, 2005, the patient had an MRI of  the lumbar spine without contrast for evaluation of his chronic progressive low back and left buttock pain.  This showed confluent retroperitoneal adenopathy encasing the renal vessels and measuring up to 5.2 x 3.4 cm transversely. There was no epidural mass and the kidneys appeared unremarkable.  This had been compared with an MRI from Nov 09, 2004 where no such adenopathy was noted.    The patient's wife, Willie Garcia, is a Museum/gallery curator at [name], so she brought this to the attention of Willie Garcia and a CT Scan of the chest, abdomen and pelvis was obtained there on June 11th.  It was ready by Willie Garcia, showing some small mediastinal lymph nodes, the largest being 1.4 cm, but significant adenopathy surrounding the left renal vein with anterior displacement of that vein as well as the inferior vena cava.  This measured 5.2 cm in transverse diameter and 3.8 cm in AP diameter.  The adenopathy extended inferiorly to the level of the aortic bifurcation.  There were small mesenteric lymph nodes, all less than 12 mm. The spleen was upper normal in size with no focal lesions.  There was no pelvic adenopathy.    His subsequent history is as detailed below   PAST MEDICAL HISTORY: Past Medical History:  Diagnosis Date   Allergic rhinitis    Anemia    Anxiety    Basal cell carcinoma of skin 2015   BPH (benign prostatic hyperplasia)    CAD (coronary artery disease)    Depression    Ganglion cyst  left thumb   GERD (gastroesophageal reflux disease)    HTN (hypertension)    Hyperlipidemia    Non Hodgkin's lymphoma (HCC)    Osteopenia    Personal history of colonic adenomas 04/04/2010   Scoliosis    Spinal stenosis    30 degree curve in back    PAST SURGICAL HISTORY: Past Surgical History:  Procedure Laterality Date   CARDIAC CATHETERIZATION  07/14/2014   Procedure: LEFT HEART CATH AND CORS/GRAFTS ANGIOGRAPHY;  Surgeon: Jettie Booze, MD;  Location: [facility] CATH LAB;  Service:  Cardiovascular;;   CARDIAC CATHETERIZATION  07/14/2014   Procedure: INTRAVASCULAR PRESSURE WIRE/FFR STUDY;  Surgeon: Jettie Booze, MD;  Location: [facility] CATH LAB;  Service: Cardiovascular;;  circ   CERVICAL SPINE SURGERY     C4-C6   COLONOSCOPY  06/2013   negative   CORONARY ARTERY BYPASS GRAFT     HERNIA REPAIR     LEFT HEART CATH AND CORS/GRAFTS ANGIOGRAPHY N/A 05/24/2020   Procedure: LEFT HEART CATH AND CORS/GRAFTS ANGIOGRAPHY;  Surgeon: Jettie Booze, MD;  Location: [place] CV LAB;  Service: Cardiovascular;  Laterality: N/A;   RHINOPLASTY     UPPER GASTROINTESTINAL ENDOSCOPY      FAMILY HISTORY Family History  Problem Relation Age of Onset   Heart disease Mother    Heart attack Mother 2   Heart disease Father    Heart attack Father 83   Heart attack Brother 17   Diabetes Brother    Aneurysm Paternal Uncle    Aneurysm Maternal Grandmother    Heart attack Maternal Grandmother    Aneurysm Maternal Grandfather    Heart attack Maternal Grandfather    Colon cancer Neg Hx    Stomach cancer Neg Hx    Esophageal cancer Neg Hx    Rectal cancer Neg Hx   The patient's father died at the age of 77 from heart problems.  The patient's mother died at the age of 37 from heart problems.  The patient has three brothers and two sisters.  The only cancer in the family known to the patient is the maternal grandmother, who had breast cancer diagnosed in her fifties or 65.     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  He has been married to Willie Garcia for >30 years.  They have no children of their own. She worked for [name] until August 2018. They attend [employer]. [place]    ADVANCED DIRECTIVES: in place   HEALTH MAINTENANCE: Social History   Tobacco Use   Smoking status: Never   Smokeless tobacco: Never  Vaping Use   Vaping Use: Never used  Substance Use Topics   Alcohol use: No    Alcohol/week: 0.0 standard  drinks   Drug use: No     Colonoscopy: 10/2018, benign [name])  PSA: 11/2018, 1.1  Bone density:  Lipid panel:  No Known Allergies  Current Outpatient Medications  Medication Sig Dispense Refill   acetaminophen (TYLENOL) 500 MG tablet Take 500 mg by mouth every 6 (six) hours as needed (pain).     Ascorbic Acid (VITAMIN C) 1000 MG tablet Take 1,000 mg by mouth daily.     aspirin EC 81 MG tablet Take 81 mg by mouth every Monday, Tuesday, Wednesday, Thursday, and Friday. Swallow whole.     CALCIUM-VITAMIN D PO Take 1 tablet by mouth 3 (three) times a week.      cyanocobalamin (,VITAMIN B-12,) 1000 MCG/ML injection Inject 1,000 mcg  into the muscle every 30 (thirty) days.     dicyclomine (BENTYL) 20 MG tablet TAKE ONE TABLET EVERY 6 HOURS AS NEEDED FOR SPASMS. 120 tablet 0   ezetimibe (ZETIA) 10 MG tablet Take 10 mg by mouth 3 (three) times a week.      fluticasone (FLONASE) 50 MCG/ACT nasal spray Place 2 sprays into both nostrils daily as needed for allergies or rhinitis.      folic acid (FOLVITE) 1 MG tablet Take 1 mg by mouth 3 (three) times a week.     gabapentin (NEURONTIN) 300 MG capsule TAKE 1 CAPSULE AT BEDTIME. 90 capsule 0   HYDROcodone-acetaminophen (NORCO/VICODIN) 5-325 MG tablet Take 1 tablet by mouth 2 (two) times daily as needed (back pain.).      isosorbide mononitrate (IMDUR) 30 MG 24 hr tablet TAKE (1/2) TABLET DAILY. 45 tablet 3   loratadine (CLARITIN) 10 MG tablet Take 10 mg by mouth daily as needed for allergies.     metoCLOPramide (REGLAN) 5 MG tablet TAKE 1 TABLET THREE TIMES DAILY BEFORE MEALS AS NEEDED. (Patient taking differently: Take 5 mg by mouth 3 (three) times daily as needed (nausea/poor food digestion). \) 90 tablet 0   metoprolol succinate (TOPROL-XL) 25 MG 24 hr tablet Take 1 tablet (25 mg total) by mouth daily. 90 tablet 1   nitroGLYCERIN (NITROSTAT) 0.4 MG SL tablet Place 1 tablet (0.4 mg total) under the tongue every 5 (five) minutes as needed for chest  pain. 30 tablet 3   pantoprazole (PROTONIX) 20 MG tablet TAKE 1 TABLET DAILY BEFORE BREAKFAST. (Patient taking differently: Take 20 mg by mouth 3 (three) times a week.) 30 tablet 2   rosuvastatin (CRESTOR) 20 MG tablet Take 20 mg by mouth 2 (two) times a week.     silodosin (RAPAFLO) 8 MG CAPS capsule Take 8 mg by mouth at bedtime as needed (difficulty urinating).      testosterone cypionate (DEPOTESTOTERONE CYPIONATE) 200 MG/ML injection Inject 50 mg into the muscle once a week. AS DIRECTED (0.25 ml)     No current facility-administered medications for this visit.    OBJECTIVE: Middle-aged white man who appears stated age  75:   01/24/21 1117  BP: (!) 100/52  Pulse: (!) 57  Resp: 18  Temp: (!) 97.5 F (36.4 C)  SpO2: 99%      Body mass index is 20.65 kg/m.    ECOG FS: 1  Sclerae unicteric, EOMs intact Wearing a mask No cervical or supraclavicular adenopathy, no inguinal or axillary adenopathy Lungs no rales or rhonchi Heart regular rate and rhythm Abd soft, nontender, positive bowel sounds MSK no focal spinal tenderness, no upper extremity lymphedema Neuro: nonfocal, well oriented, appropriate affect   LAB RESULTS: Lab Results  Component Value Date   WBC 8.1 01/18/2021   NEUTROABS 5.2 01/18/2021   HGB 12.3 (L) 01/18/2021   HCT 36.3 (L) 01/18/2021   MCV 92.1 01/18/2021   PLT 201 01/18/2021      Chemistry      Component Value Date/Time   NA 139 01/18/2021 0929   NA 140 05/17/2020 1246   NA 140 01/03/2017 1033   K 4.4 01/18/2021 0929   K 4.1 01/03/2017 1033   CL 103 01/18/2021 0929   CL 101 06/30/2012 1325   CO2 28 01/18/2021 0929   CO2 28 01/03/2017 1033   BUN 12 01/18/2021 0929   BUN 10 05/17/2020 1246   BUN 17.5 01/03/2017 1033   CREATININE 1.07 01/18/2021 0929  CREATININE 1.17 01/15/2019 1138   CREATININE 1.0 01/03/2017 1033      Component Value Date/Time   CALCIUM 9.4 01/18/2021 0929   CALCIUM 9.4 01/03/2017 1033   ALKPHOS 75 01/18/2021 0929    ALKPHOS 96 01/03/2017 1033   AST 12 (L) 01/18/2021 0929   AST 14 (L) 01/15/2019 1138   AST 13 01/03/2017 1033   ALT 10 01/18/2021 0929   ALT 9 01/15/2019 1138   ALT 8 01/03/2017 1033   BILITOT 0.8 01/18/2021 0929   BILITOT 0.7 01/15/2019 1138   BILITOT 0.92 01/03/2017 1033       No results found for: LABCA2  No components found for: LABCA125  No results for input(s): INR in the last 168 hours.  Urinalysis No results found for: COLORURINE   STUDIES: MR LUMBAR SPINE WO CONTRAST  Result Date: 12/30/2020 CLINICAL DATA:  Lower back pain radiating into the right hip and leg EXAM: MRI LUMBAR SPINE WITHOUT CONTRAST TECHNIQUE: Multiplanar, multisequence MR imaging of the lumbar spine was performed. No intravenous contrast was administered. COMPARISON:  11/04/2014 FINDINGS: Segmentation: 5 lumbar type vertebrae based on the available coverage Alignment: Dextroscoliosis with rightward translation at L3-4 and L4-5. Vertebrae:  No fracture, evidence of discitis, or bone lesion. Conus medullaris and cauda equina: Conus extends to the L1 level. Conus and cauda equina appear normal. Paraspinal and other soft tissues: No evidence of mass or inflammation Disc levels: T12- L1: Disc narrowing and bulging with mild facet spurring. L1-L2: Disc collapse with endplate ridging that is mild. Mild facet spurring L2-L3: Disc collapse and intervertebral ankylosis. Mild left facet spurring L3-L4: Disc collapse with endplate ridging and disc bulging. Asymmetric left facet spurring. Left subarticular recess narrowing without definite compressive stenosis, non progressed. L4-L5: Disc collapse and endplate degeneration with bulging and ridging. Asymmetric right facet spurring. Progressive and advanced right foraminal stenosis L5-S1:Disc collapse towards the right with endplate ridging and disc bulging. Asymmetric right facet spurring. Right foraminal impingement. Patent spinal canal IMPRESSION: 1. Generalized spinal  degeneration with scoliosis and progression from 2016. 2. L4-5 and L5-S1 right foraminal impingement. 3. L3-4 moderate left subarticular recess stenosis. 4. 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 Electronically Signed   By: Monte Fantasia M.D.   On: 12/30/2020 08:17     ASSESSMENT: 75 y.o. 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   PLAN:  Zahar Garcia is now 15 years out from diagnosis of follicular lymphoma.  There is no clear evidence of disease activity, with no adenopathy, fairly stable weight.  Of course this move again fatigue but then he has chronic pain and some muscle wasting issues which have not improved significantly despite his being on testosterone.  Certainly it could be that he has some active disease that I am missing by exam and labs.  I think it would be useful to obtain a PET scan at this point.  It has been 6 years since her last PET scan.  I did asked them to make sure that their out-of-pocket cost would be reasonable for this test.  If we do that in about a month he will see me again in about 2 months.  They know to call for any other issue that may develop before then  Total encounter time 20 minutes.*  Asjah Rauda, Willie Dad, MD  01/24/21 11:36 AM Medical Oncology and Hematology [facility] [place], [place] [address] Tel. [phone]    Fax. [phone]   I, Wilburn Mylar, am acting as scribe for Dr. Virgie Garcia. Tomeika Weinmann.  I, Lurline Del MD, have reviewed the above documentation for accuracy and completeness, and I agree with the above.   *Total Encounter Time as defined by the Centers for Medicare and Medicaid Services includes, in addition to the  face-to-face time of a patient visit (documented in the note above) non-face-to-face time: obtaining and reviewing outside history, ordering and reviewing medications, tests or procedures, care coordination (communications with other health care professionals or caregivers) and documentation in the medical record.

## 2021-02-07 DIAGNOSIS — K629 Disease of anus and rectum, unspecified: Secondary | ICD-10-CM | POA: Diagnosis not present

## 2021-02-09 DIAGNOSIS — M5416 Radiculopathy, lumbar region: Secondary | ICD-10-CM | POA: Diagnosis not present

## 2021-02-10 DIAGNOSIS — Z125 Encounter for screening for malignant neoplasm of prostate: Secondary | ICD-10-CM | POA: Diagnosis not present

## 2021-02-10 DIAGNOSIS — D510 Vitamin B12 deficiency anemia due to intrinsic factor deficiency: Secondary | ICD-10-CM | POA: Diagnosis not present

## 2021-02-10 DIAGNOSIS — M859 Disorder of bone density and structure, unspecified: Secondary | ICD-10-CM | POA: Diagnosis not present

## 2021-02-10 DIAGNOSIS — M8589 Other specified disorders of bone density and structure, multiple sites: Secondary | ICD-10-CM | POA: Diagnosis not present

## 2021-02-10 DIAGNOSIS — E785 Hyperlipidemia, unspecified: Secondary | ICD-10-CM | POA: Diagnosis not present

## 2021-02-10 DIAGNOSIS — E291 Testicular hypofunction: Secondary | ICD-10-CM | POA: Diagnosis not present

## 2021-02-13 DIAGNOSIS — Z23 Encounter for immunization: Secondary | ICD-10-CM | POA: Diagnosis not present

## 2021-02-13 DIAGNOSIS — E785 Hyperlipidemia, unspecified: Secondary | ICD-10-CM | POA: Diagnosis not present

## 2021-02-13 DIAGNOSIS — J302 Other seasonal allergic rhinitis: Secondary | ICD-10-CM | POA: Diagnosis not present

## 2021-02-13 DIAGNOSIS — Z1212 Encounter for screening for malignant neoplasm of rectum: Secondary | ICD-10-CM | POA: Diagnosis not present

## 2021-02-13 DIAGNOSIS — D510 Vitamin B12 deficiency anemia due to intrinsic factor deficiency: Secondary | ICD-10-CM | POA: Diagnosis not present

## 2021-02-13 DIAGNOSIS — R5383 Other fatigue: Secondary | ICD-10-CM | POA: Diagnosis not present

## 2021-02-13 DIAGNOSIS — M5136 Other intervertebral disc degeneration, lumbar region: Secondary | ICD-10-CM | POA: Diagnosis not present

## 2021-02-13 DIAGNOSIS — R82998 Other abnormal findings in urine: Secondary | ICD-10-CM | POA: Diagnosis not present

## 2021-02-13 DIAGNOSIS — M5030 Other cervical disc degeneration, unspecified cervical region: Secondary | ICD-10-CM | POA: Diagnosis not present

## 2021-02-13 DIAGNOSIS — M8580 Other specified disorders of bone density and structure, unspecified site: Secondary | ICD-10-CM | POA: Diagnosis not present

## 2021-02-13 DIAGNOSIS — Z0000 Encounter for general adult medical examination without abnormal findings: Secondary | ICD-10-CM | POA: Diagnosis not present

## 2021-02-13 DIAGNOSIS — I2510 Atherosclerotic heart disease of native coronary artery without angina pectoris: Secondary | ICD-10-CM | POA: Diagnosis not present

## 2021-02-13 DIAGNOSIS — C8590 Non-Hodgkin lymphoma, unspecified, unspecified site: Secondary | ICD-10-CM | POA: Diagnosis not present

## 2021-02-13 DIAGNOSIS — I10 Essential (primary) hypertension: Secondary | ICD-10-CM | POA: Diagnosis not present

## 2021-02-14 ENCOUNTER — Other Ambulatory Visit: Payer: Self-pay

## 2021-02-14 ENCOUNTER — Ambulatory Visit (HOSPITAL_COMMUNITY)
Admission: RE | Admit: 2021-02-14 | Discharge: 2021-02-14 | Disposition: A | Discharge status: Home / Self Care | Payer: Medicare Other | Source: Ambulatory Visit | Attending: Oncology | Admitting: Oncology

## 2021-02-14 DIAGNOSIS — R590 Localized enlarged lymph nodes: Secondary | ICD-10-CM | POA: Diagnosis not present

## 2021-02-14 DIAGNOSIS — C8201 Follicular lymphoma grade I, lymph nodes of head, face, and neck: Secondary | ICD-10-CM | POA: Diagnosis not present

## 2021-02-14 DIAGNOSIS — C8590 Non-Hodgkin lymphoma, unspecified, unspecified site: Secondary | ICD-10-CM | POA: Diagnosis not present

## 2021-02-14 LAB — GLUCOSE, CAPILLARY: Glucose-Capillary: 87 mg/dL (ref 70–99)

## 2021-02-14 MED ORDER — FLUDEOXYGLUCOSE F - 18 (FDG) INJECTION
8.0000 | Freq: Once | INTRAVENOUS | Status: AC | PRN
  Administered 2021-02-14: 7.45 via INTRAVENOUS

## 2021-02-20 ENCOUNTER — Encounter: Payer: Self-pay | Admitting: Oncology

## 2021-03-06 NOTE — Progress Notes (Signed)
ID: Willie Garcia   DOB: 04-Jan-1946  MR#: [id number]  CSN#:[id number]  Patient Care Team: Willie Solian, MD as PCP - General (Internal Medicine) [name], [name], MD as Consulting Physician (Oncology) [name], MD as Consulting Physician (Gastroenterology) [name], MD as Consulting Physician (Cardiology) [name], MD (Inactive) as Consulting Physician (Anesthesiology) [name], MD as Consulting Physician (Urology) [name], MD as Consulting Physician (Pain Medicine) OTHER MD:   CHIEF COMPLAINT: non-Hodgkin's lymphoma  CURRENT TREATMENT: observation   INTERVAL HISTORY: Willie Garcia returns today for follow-up of his low-grade follicular non-Hodgkin's B cell lymphoma accompanied by his wife Willie Garcia. He continues under observation.   Since his last visit, he underwent restaging PET scan on 02/14/2021 showing: no evidence of lymphoma recurrence; small right paratracheal and left hilar lymph nodes, which are normal in size and mildly metabolic, are favored reactive.   REVIEW OF SYSTEMS: Willie Garcia is "same as always".  He does some yard work, fixing the car, and feeding his dogs and cats, and then feels tired and weak.  He has had no fevers, no drenching sweats, no weight loss, no adenopathy, no pruritus, and no rash.  A detailed review of systems was otherwise stable   COVID 19 VACCINATION STATUS: Willie Garcia x2 followed by booster x2   HISTORY OF PRESENT ILLNESS:  from the original intake note:  Willie Garcia has long had back problems and has been followed for this by Dr. [name].  On November 20, 2005, the patient had an MRI of the lumbar spine without contrast for evaluation of his chronic progressive low back and left buttock pain.  This showed confluent retroperitoneal adenopathy encasing the renal vessels and measuring up to 5.2 x 3.4 cm transversely. There was no epidural mass and the kidneys appeared unremarkable.  This had been compared with an MRI from Nov 09, 2004 where no  such adenopathy was noted.    The patient's wife, Willie Garcia, is a Museum/gallery curator at [name], so she brought this to the attention of [name] and a CT Scan of the chest, abdomen and pelvis was obtained there on June 11th.  It was ready by [name], showing some small mediastinal lymph nodes, the largest being 1.4 cm, but significant adenopathy surrounding the left renal vein with anterior displacement of that vein as well as the inferior vena cava.  This measured 5.2 cm in transverse diameter and 3.8 cm in AP diameter.  The adenopathy extended inferiorly to the level of the aortic bifurcation.  There were small mesenteric lymph nodes, all less than 12 mm. The spleen was upper normal in size with no focal lesions.  There was no pelvic adenopathy.    His subsequent history is as detailed below   PAST MEDICAL HISTORY: Past Medical History:  Diagnosis Date   Allergic rhinitis    Anemia    Anxiety    Basal cell carcinoma of skin 2015   BPH (benign prostatic hyperplasia)    CAD (coronary artery disease)    Depression    Ganglion cyst    left thumb   GERD (gastroesophageal reflux disease)    HTN (hypertension)    Hyperlipidemia    Non Hodgkin's lymphoma ([place])    Osteopenia    Personal history of colonic adenomas 04/04/2010   Scoliosis    Spinal stenosis    30 degree curve in back    PAST SURGICAL HISTORY: Past Surgical History:  Procedure Laterality Date   CARDIAC CATHETERIZATION  07/14/2014  Procedure: LEFT HEART CATH AND CORS/GRAFTS ANGIOGRAPHY;  Surgeon: Willie Booze, MD;  Location: [name];  Service: Cardiovascular;;   CARDIAC CATHETERIZATION  07/14/2014   Procedure: INTRAVASCULAR PRESSURE WIRE/FFR STUDY;  Surgeon: Willie Booze, MD;  Location: [facility] CATH LAB;  Service: Cardiovascular;;  circ   CERVICAL SPINE SURGERY     C4-C6   COLONOSCOPY  06/2013   negative   CORONARY ARTERY BYPASS GRAFT     HERNIA REPAIR     LEFT HEART CATH AND CORS/GRAFTS ANGIOGRAPHY  N/A 05/24/2020   Procedure: LEFT HEART CATH AND CORS/GRAFTS ANGIOGRAPHY;  Surgeon: Willie Booze, MD;  Location: [name];  Service: Cardiovascular;  Laterality: N/A;   RHINOPLASTY     UPPER GASTROINTESTINAL ENDOSCOPY      FAMILY HISTORY Family History  Problem Relation Age of Onset   Heart disease Mother    Heart attack Mother 75   Heart disease Father    Heart attack Father 87   Heart attack Brother 34   Diabetes Brother    Aneurysm Paternal Uncle    Aneurysm Maternal Grandmother    Heart attack Maternal Grandmother    Aneurysm Maternal Grandfather    Heart attack Maternal Grandfather    Colon cancer Neg Hx    Stomach cancer Neg Hx    Esophageal cancer Neg Hx    Rectal cancer Neg Hx   The patient's father died at the age of 5 from heart problems.  The patient's mother died at the age of 59 from heart problems.  The patient has three brothers and two sisters.  The only cancer in the family known to the patient is the maternal grandmother, who had breast cancer diagnosed in her fifties or 20.     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  He has been married to Willie Garcia for >30 years.  They have no children of their own. She worked for [name] until August 2018. They attend [name]    ADVANCED DIRECTIVES: in place   HEALTH MAINTENANCE: Social History   Tobacco Use   Smoking status: Never   Smokeless tobacco: Never  Vaping Use   Vaping Use: Never used  Substance Use Topics   Alcohol use: No    Alcohol/week: 0.0 standard drinks   Drug use: No     Colonoscopy: 10/2018, benign [name])  PSA: 11/2018, 1.1  Bone density:  Lipid panel:  No Known Allergies  Current Outpatient Medications  Medication Sig Dispense Refill   acetaminophen (TYLENOL) 500 MG tablet Take 500 mg by mouth every 6 (six) hours as needed (pain).     Ascorbic Acid (VITAMIN C) 1000 MG tablet Take 1,000 mg by mouth  daily.     aspirin EC 81 MG tablet Take 81 mg by mouth every Monday, Tuesday, Wednesday, Thursday, and Friday. Swallow whole.     CALCIUM-VITAMIN D PO Take 1 tablet by mouth 3 (three) times a week.      cyanocobalamin (,VITAMIN B-12,) 1000 MCG/ML injection Inject 1,000 mcg into the muscle every 30 (thirty) days.     dicyclomine (BENTYL) 20 MG tablet TAKE ONE TABLET EVERY 6 HOURS AS NEEDED FOR SPASMS. 120 tablet 0   ezetimibe (ZETIA) 10 MG tablet Take 10 mg by mouth 3 (three) times a week.      fluticasone (FLONASE) 50 MCG/ACT nasal spray Place 2 sprays into both nostrils daily as needed for allergies or rhinitis.  folic acid (FOLVITE) 1 MG tablet Take 1 mg by mouth 3 (three) times a week.     gabapentin (NEURONTIN) 300 MG capsule TAKE 1 CAPSULE AT BEDTIME. 90 capsule 0   HYDROcodone-acetaminophen (NORCO/VICODIN) 5-325 MG tablet Take 1 tablet by mouth 2 (two) times daily as needed (back pain.).      isosorbide mononitrate (IMDUR) 30 MG 24 hr tablet TAKE (1/2) TABLET DAILY. 45 tablet 3   loratadine (CLARITIN) 10 MG tablet Take 10 mg by mouth daily as needed for allergies.     metoCLOPramide (REGLAN) 5 MG tablet TAKE 1 TABLET THREE TIMES DAILY BEFORE MEALS AS NEEDED. (Patient taking differently: Take 5 mg by mouth 3 (three) times daily as needed (nausea/poor food digestion). \) 90 tablet 0   metoprolol succinate (TOPROL-XL) 25 MG 24 hr tablet Take 1 tablet (25 mg total) by mouth daily. 90 tablet 1   nitroGLYCERIN (NITROSTAT) 0.4 MG SL tablet Place 1 tablet (0.4 mg total) under the tongue every 5 (five) minutes as needed for chest pain. 30 tablet 3   pantoprazole (PROTONIX) 20 MG tablet TAKE 1 TABLET DAILY BEFORE BREAKFAST. (Patient taking differently: Take 20 mg by mouth 3 (three) times a week.) 30 tablet 2   rosuvastatin (CRESTOR) 20 MG tablet Take 20 mg by mouth 2 (two) times a week.     silodosin (RAPAFLO) 8 MG CAPS capsule Take 8 mg by mouth at bedtime as needed (difficulty urinating).       testosterone cypionate (DEPOTESTOTERONE CYPIONATE) 200 MG/ML injection Inject 50 mg into the muscle once a week. AS DIRECTED (0.25 ml)     No current facility-administered medications for this visit.    OBJECTIVE:  white man who appears stated age  75:   03/07/21 1105  BP: 124/77  Pulse: (!) 59  Resp: 16  Temp: (!) 97.5 F (36.4 C)  SpO2: 98%       Body mass index is 21.15 kg/m.    ECOG FS: 1  Sclerae unicteric, EOMs intact Wearing a mask No cervical or supraclavicular adenopathy, no axillary or inguinal adenopathy Lungs no rales or rhonchi Heart regular rate and rhythm Abd soft, nontender, positive bowel sounds MSK no focal spinal tenderness, no upper extremity lymphedema Neuro: nonfocal, well oriented, appropriate affect    LAB RESULTS: Lab Results  Component Value Date   WBC 5.7 03/07/2021   NEUTROABS 3.4 03/07/2021   HGB 13.2 03/07/2021   HCT 39.2 03/07/2021   MCV 92.5 03/07/2021   PLT 246 03/07/2021      Chemistry      Component Value Date/Time   NA 139 01/18/2021 0929   NA 140 05/17/2020 1246   NA 140 01/03/2017 1033   K 4.4 01/18/2021 0929   K 4.1 01/03/2017 1033   CL 103 01/18/2021 0929   CL 101 06/30/2012 1325   CO2 28 01/18/2021 0929   CO2 28 01/03/2017 1033   BUN 12 01/18/2021 0929   BUN 10 05/17/2020 1246   BUN 17.5 01/03/2017 1033   CREATININE 1.07 01/18/2021 0929   CREATININE 1.17 01/15/2019 1138   CREATININE 1.0 01/03/2017 1033      Component Value Date/Time   CALCIUM 9.4 01/18/2021 0929   CALCIUM 9.4 01/03/2017 1033   ALKPHOS 75 01/18/2021 0929   ALKPHOS 96 01/03/2017 1033   AST 12 (L) 01/18/2021 0929   AST 14 (L) 01/15/2019 1138   AST 13 01/03/2017 1033   ALT 10 01/18/2021 0929   ALT 9 01/15/2019 1138  ALT 8 01/03/2017 1033   BILITOT 0.8 01/18/2021 0929   BILITOT 0.7 01/15/2019 1138   BILITOT 0.92 01/03/2017 1033       No results found for: LABCA2  No components found for: LABCA125  No results for input(s): INR  in the last 168 hours.  Urinalysis No results found for: COLORURINE   STUDIES: NM PET Image Restag (PS) Skull Base To Thigh  Result Date: 02/15/2021 CLINICAL DATA:  Subsequent treatment strategy for non-Hodgkin's lymphoma. EXAM: NUCLEAR MEDICINE PET SKULL BASE TO THIGH TECHNIQUE: 7.6 mCi F-18 FDG was injected intravenously. Full-ring PET imaging was performed from the skull base to thigh after the radiotracer. CT data was obtained and used for attenuation correction and anatomic localization. Fasting blood glucose: 7.5 mg/dl COMPARISON:  PET-CT 01/20/2015 FINDINGS: Mediastinal blood pool activity: SUV max 1.35 Liver activity: SUV max NA NECK: No hypermetabolic lymph nodes in the neck. Incidental CT findings: none CHEST: Single small hypermetabolic RIGHT lower paratracheal lymph node is normal size (5 mm short axis) and has moderate metabolic activity (SUV max equal 3.9). Similar small hypermetabolic node in the LEFT hilum inferiorly with SUV max equal 3.7. Incidental CT findings: No suspicious pulmonary nodules. ABDOMEN/PELVIS: No abnormal hypermetabolic activity within the liver, pancreas, adrenal glands, or spleen. No hypermetabolic lymph nodes in the abdomen or pelvis. Spleen normal volume and normal metabolic activity. Incidental CT findings: none SKELETON: No focal hypermetabolic activity to suggest skeletal metastasis. Incidental CT findings: none IMPRESSION: 1. No evidence of lymphoma recurrence on skull base to thigh FDG PET scan. 2. Small RIGHT paratracheal and LEFT hilar lymph nodes which are normal size and mildly metabolic are favored reactive. Electronically Signed   By: Suzy Bouchard M.D.   On: 02/15/2021 14:09     ASSESSMENT: 75 y.o. 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   PLAN:  Tyaun is now 15 years out from initial diagnosis of his follicular non-Hodgkin's lymphoma.  He has been off treatment for more than 14 years, with no evidence of disease activity.  This is very favorable.  We reviewed the results of the PET scan in detail.  He understands the 2 very small lymph nodes (0.5cm) which were mildly hypermetabolic are almost certainly reactive and certainly do not require any intervention.  He and Willie Garcia particularly would feel more secure seeing [place] on a once a year basis and of course we are glad to accommodate.  He will see [place] again in 1 year with lab work and physical exam only but of course if any suspicious symptoms develop we can do additional studies.  Total encounter time 20 minutes.*   Amayrany Cafaro, [name], MD  03/07/21 11:25 AM Medical Oncology and Hematology [facility] [place], [place] [address] Tel. [phone]    Fax. [phone]   I, Wilburn Mylar, am acting as scribe for Dr. [name]. Dontrey Snellgrove.  I, Lurline Del MD, have reviewed the above documentation for accuracy and completeness, and I agree with the above.   *Total Encounter Time as defined by the Centers for Medicare and Medicaid Services includes, in addition to the face-to-face time of a patient visit (documented in the note above) non-face-to-face time: obtaining and reviewing outside history, ordering and reviewing medications,  tests or procedures, care coordination (communications with other health care professionals or caregivers) and documentation in the medical record.

## 2021-03-07 ENCOUNTER — Other Ambulatory Visit: Payer: Self-pay

## 2021-03-07 ENCOUNTER — Inpatient Hospital Stay (HOSPITAL_BASED_OUTPATIENT_CLINIC_OR_DEPARTMENT_OTHER): Payer: Medicare Other | Admitting: Oncology

## 2021-03-07 ENCOUNTER — Inpatient Hospital Stay: Payer: Medicare Other | Attending: Oncology

## 2021-03-07 VITALS — BP 124/77 | HR 59 | Temp 97.5°F | Resp 16 | Ht 69.0 in | Wt 143.2 lb

## 2021-03-07 DIAGNOSIS — C8201 Follicular lymphoma grade I, lymph nodes of head, face, and neck: Secondary | ICD-10-CM

## 2021-03-07 DIAGNOSIS — C8200 Follicular lymphoma grade I, unspecified site: Secondary | ICD-10-CM

## 2021-03-07 DIAGNOSIS — Z8572 Personal history of non-Hodgkin lymphomas: Secondary | ICD-10-CM | POA: Diagnosis not present

## 2021-03-07 DIAGNOSIS — C8204 Follicular lymphoma grade I, lymph nodes of axilla and upper limb: Secondary | ICD-10-CM

## 2021-03-07 DIAGNOSIS — R599 Enlarged lymph nodes, unspecified: Secondary | ICD-10-CM | POA: Insufficient documentation

## 2021-03-07 DIAGNOSIS — I10 Essential (primary) hypertension: Secondary | ICD-10-CM

## 2021-03-07 DIAGNOSIS — M4122 Other idiopathic scoliosis, cervical region: Secondary | ICD-10-CM

## 2021-03-07 LAB — CBC WITH DIFFERENTIAL/PLATELET
Abs Immature Granulocytes: 0.01 10*3/uL (ref 0.00–0.07)
Basophils Absolute: 0 10*3/uL (ref 0.0–0.1)
Basophils Relative: 1 %
Eosinophils Absolute: 0.3 10*3/uL (ref 0.0–0.5)
Eosinophils Relative: 6 %
HCT: 39.2 % (ref 39.0–52.0)
Hemoglobin: 13.2 g/dL (ref 13.0–17.0)
Immature Granulocytes: 0 %
Lymphocytes Relative: 24 %
Lymphs Abs: 1.4 10*3/uL (ref 0.7–4.0)
MCH: 31.1 pg (ref 26.0–34.0)
MCHC: 33.7 g/dL (ref 30.0–36.0)
MCV: 92.5 fL (ref 80.0–100.0)
Monocytes Absolute: 0.5 10*3/uL (ref 0.1–1.0)
Monocytes Relative: 10 %
Neutro Abs: 3.4 10*3/uL (ref 1.7–7.7)
Neutrophils Relative %: 59 %
Platelets: 246 10*3/uL (ref 150–400)
RBC: 4.24 MIL/uL (ref 4.22–5.81)
RDW: 12.9 % (ref 11.5–15.5)
WBC: 5.7 10*3/uL (ref 4.0–10.5)
nRBC: 0 % (ref 0.0–0.2)

## 2021-03-07 LAB — COMPREHENSIVE METABOLIC PANEL
ALT: <6 U/L (ref 0–44)
AST: 12 U/L — ABNORMAL LOW (ref 15–41)
Albumin: 3.8 g/dL (ref 3.5–5.0)
Alkaline Phosphatase: 89 U/L (ref 38–126)
Anion gap: 8 (ref 5–15)
BUN: 10 mg/dL (ref 8–23)
CO2: 29 mmol/L (ref 22–32)
Calcium: 9.2 mg/dL (ref 8.9–10.3)
Chloride: 104 mmol/L (ref 98–111)
Creatinine, Ser: 1.12 mg/dL (ref 0.61–1.24)
GFR, Estimated: >60 mL/min (ref >60)
Glucose, Bld: 90 mg/dL (ref 70–99)
Potassium: 4.5 mmol/L (ref 3.5–5.1)
Sodium: 141 mmol/L (ref 135–145)
Total Bilirubin: 0.7 mg/dL (ref 0.3–1.2)
Total Protein: 6.6 g/dL (ref 6.5–8.1)

## 2021-03-07 LAB — LACTATE DEHYDROGENASE: LDH: 142 U/L (ref 98–192)

## 2021-03-08 LAB — BETA 2 MICROGLOBULIN, SERUM: Beta-2 Microglobulin: 1.6 mg/L (ref 0.6–2.4)

## 2021-03-12 ENCOUNTER — Other Ambulatory Visit: Payer: Self-pay | Admitting: Internal Medicine

## 2021-04-12 DIAGNOSIS — M4126 Other idiopathic scoliosis, lumbar region: Secondary | ICD-10-CM | POA: Diagnosis not present

## 2021-04-21 ENCOUNTER — Institutional Professional Consult (permissible substitution): Payer: Medicare Other | Admitting: Neurology

## 2021-05-01 ENCOUNTER — Other Ambulatory Visit: Payer: Self-pay | Admitting: Oncology

## 2021-05-10 ENCOUNTER — Institutional Professional Consult (permissible substitution): Payer: Medicare Other | Admitting: Neurology

## 2021-05-16 DIAGNOSIS — Z23 Encounter for immunization: Secondary | ICD-10-CM | POA: Diagnosis not present

## 2021-05-23 ENCOUNTER — Encounter: Payer: Self-pay | Admitting: *Deleted

## 2021-05-24 ENCOUNTER — Institutional Professional Consult (permissible substitution): Payer: Medicare Other | Admitting: Neurology

## 2021-06-04 ENCOUNTER — Other Ambulatory Visit: Payer: Self-pay | Admitting: Internal Medicine

## 2021-06-20 ENCOUNTER — Ambulatory Visit: Payer: Medicare Other | Admitting: Internal Medicine

## 2021-07-06 DIAGNOSIS — H5203 Hypermetropia, bilateral: Secondary | ICD-10-CM | POA: Diagnosis not present

## 2021-07-06 DIAGNOSIS — H2513 Age-related nuclear cataract, bilateral: Secondary | ICD-10-CM | POA: Diagnosis not present

## 2021-07-10 ENCOUNTER — Other Ambulatory Visit: Payer: Self-pay | Admitting: *Deleted

## 2021-07-10 DIAGNOSIS — I10 Essential (primary) hypertension: Secondary | ICD-10-CM

## 2021-07-10 MED ORDER — METOPROLOL SUCCINATE ER 25 MG PO TB24: 25.0000 mg | ORAL_TABLET | Freq: Every day | ORAL | 3 refills | Status: DC

## 2021-07-17 ENCOUNTER — Ambulatory Visit (INDEPENDENT_AMBULATORY_CARE_PROVIDER_SITE_OTHER): Payer: Medicare Other | Admitting: Internal Medicine

## 2021-07-17 ENCOUNTER — Other Ambulatory Visit: Payer: Self-pay

## 2021-07-17 ENCOUNTER — Encounter: Payer: Self-pay | Admitting: Internal Medicine

## 2021-07-17 VITALS — BP 114/60 | HR 51 | Ht 69.0 in | Wt 142.0 lb

## 2021-07-17 DIAGNOSIS — E785 Hyperlipidemia, unspecified: Secondary | ICD-10-CM

## 2021-07-17 DIAGNOSIS — Z951 Presence of aortocoronary bypass graft: Secondary | ICD-10-CM

## 2021-07-17 DIAGNOSIS — I10 Essential (primary) hypertension: Secondary | ICD-10-CM | POA: Diagnosis not present

## 2021-07-17 DIAGNOSIS — I2510 Atherosclerotic heart disease of native coronary artery without angina pectoris: Secondary | ICD-10-CM

## 2021-07-17 MED ORDER — METOPROLOL SUCCINATE ER 25 MG PO TB24: 12.5000 mg | ORAL_TABLET | Freq: Every day | ORAL | 3 refills | Status: DC

## 2021-07-17 MED ORDER — NITROGLYCERIN 0.4 MG SL SUBL: 0.4000 mg | SUBLINGUAL_TABLET | SUBLINGUAL | 3 refills | Status: DC | PRN

## 2021-07-17 NOTE — Patient Instructions (Addendum)
Medication Instructions:  Reduce Metoprolol succinate to 12.5 mg daily at bedtime Your physician recommends that you continue on your current medications as directed. Please refer to the Current Medication list given to you today. *If you need a refill on your cardiac medications before your next appointment, please call your pharmacy*  Lab Work: None. If you have labs (blood work) drawn today and your tests are completely normal, you will receive your results only by: [place] (if you have MyChart) OR A paper copy in the mail If you have any lab test that is abnormal or we need to change your treatment, we will call you to review the results.  Testing/Procedures: None.  Follow-Up: At [facility] [place], you and your health needs are our priority.  As part of our continuing mission to provide you with exceptional heart care, we have created designated Provider Care Teams.  These Care Teams include your primary Cardiologist (physician) and Advanced Practice Providers (APPs -  Physician Assistants and Nurse Practitioners) who all work together to provide you with the care you need, when you need it.  Your physician wants you to follow-up in: 12 months with Cristopher Peru, MD or one of the following Advanced Practice Providers on your designated Care Team:    Tommye Standard, [place] Legrand Como "Jonni Sanger" [place], [place]   You will receive a reminder letter in the mail two months in advance. If you don't receive a letter, please call our office to schedule the follow-up appointment.  We recommend signing up for the patient portal called "MyChart".  Sign up information is provided on this After Visit Summary.  MyChart is used to connect with patients for Virtual Visits (Telemedicine).  Patients are able to view lab/test results, encounter notes, upcoming appointments, etc.  Non-urgent messages can be sent to your provider as well.   To learn more about what you can do with MyChart, go to  [email or web address].    Any Other Special Instructions Will Be Listed Below (If Applicable).

## 2021-07-17 NOTE — Progress Notes (Signed)
HPI [name] returns today for followup. He is a pleasant 76 yo man with a  h/o CAD s/p CABG, follicular lymphoma, [name] returns today after undergoing left heart cath a couple of weeks ago for worsening dyspnea with exertion. He is a pleasant 76 yo man with CAD, s/p CABG in his early 46's for severe 3 vessel CAD. He has preserved LV function. He does not have typical chest pain. He was placed on imdur and returns today for followup. He denies chest pain or sob. No syncope. No edema. He has been weak and his bp has been low. Allergies  Allergen Reactions   Lipitor [Atorvastatin]     Weakness, myalgia      Current Outpatient Medications  Medication Sig Dispense Refill   acetaminophen (TYLENOL) 500 MG tablet Take 500 mg by mouth every 6 (six) hours as needed (pain).     Ascorbic Acid (VITAMIN C) 1000 MG tablet Take 1,000 mg by mouth daily.     aspirin EC 81 MG tablet Take 81 mg by mouth every Monday, Tuesday, Wednesday, Thursday, and Friday. Swallow whole.     CALCIUM-VITAMIN D PO Take 1 tablet by mouth 3 (three) times a week.      cyanocobalamin (,VITAMIN B-12,) 1000 MCG/ML injection Inject 1,000 mcg into the muscle every 30 (thirty) days.     dicyclomine (BENTYL) 20 MG tablet TAKE ONE TABLET EVERY 6 HOURS AS NEEDED FOR SPASMS. 120 tablet 0   ezetimibe (ZETIA) 10 MG tablet Take 10 mg by mouth 3 (three) times a week.      fluticasone (FLONASE) 50 MCG/ACT nasal spray Place 2 sprays into both nostrils daily as needed for allergies or rhinitis.      folic acid (FOLVITE) 1 MG tablet Take 1 mg by mouth 3 (three) times a week.     gabapentin (NEURONTIN) 300 MG capsule TAKE 1 CAPSULE AT BEDTIME. 90 capsule 0   HYDROcodone-acetaminophen (NORCO/VICODIN) 5-325 MG tablet Take 1 tablet by mouth 2 (two) times daily as needed (back pain.).      isosorbide mononitrate (IMDUR) 30 MG 24 hr tablet TAKE (1/2) TABLET DAILY. 45 tablet 3   loratadine (CLARITIN) 10 MG tablet Take 10 mg by mouth daily  as needed for allergies.     metoCLOPramide (REGLAN) 5 MG tablet TAKE 1 TABLET THREE TIMES DAILY BEFORE MEALS AS NEEDED. 90 tablet 0   metoprolol succinate (TOPROL XL) 25 MG 24 hr tablet Take 0.5 tablets (12.5 mg total) by mouth at bedtime. 45 tablet 3   pantoprazole (PROTONIX) 20 MG tablet TAKE 1 TABLET DAILY BEFORE BREAKFAST. (Patient taking differently: Take 20 mg by mouth 3 (three) times a week.) 30 tablet 2   rosuvastatin (CRESTOR) 20 MG tablet Take 20 mg by mouth 2 (two) times a week.     silodosin (RAPAFLO) 8 MG CAPS capsule Take 8 mg by mouth at bedtime as needed (difficulty urinating).      testosterone cypionate (DEPOTESTOTERONE CYPIONATE) 200 MG/ML injection Inject 50 mg into the muscle once a week. AS DIRECTED (0.25 ml)     nitroGLYCERIN (NITROSTAT) 0.4 MG SL tablet Place 1 tablet (0.4 mg total) under the tongue every 5 (five) minutes as needed for chest pain. 30 tablet 3   No current facility-administered medications for this visit.     Past Medical History:  Diagnosis Date   Allergic rhinitis    Anemia    Anxiety    Basal cell carcinoma of skin 2015  BPH (benign prostatic hyperplasia)    CAD (coronary artery disease)    Depression    Ganglion cyst    left thumb   GERD (gastroesophageal reflux disease)    HTN (hypertension)    Hyperlipidemia    Left inguinal hernia    Non Hodgkin's lymphoma ([facility])    Osteopenia    Personal history of colonic adenomas 04/04/2010   Scoliosis    Spinal stenosis    30 degree curve in back    ROS:   All systems reviewed and negative except as noted in the HPI.   Past Surgical History:  Procedure Laterality Date   basal cell cancer removed left eyelid   2019   CARDIAC CATHETERIZATION  07/14/2014   Procedure: LEFT HEART CATH AND CORS/GRAFTS ANGIOGRAPHY;  Surgeon: Jettie Booze, MD;  Location: [facility] CATH LAB;  Service: Cardiovascular;;   CARDIAC CATHETERIZATION  07/14/2014   Procedure: INTRAVASCULAR PRESSURE WIRE/FFR STUDY;   Surgeon: Jettie Booze, MD;  Location: [facility] CATH LAB;  Service: Cardiovascular;;  circ   CERVICAL SPINE SURGERY     C4-C6   COLONOSCOPY  06/18/2013   negative   CORONARY ARTERY BYPASS GRAFT  1999   DEXA     HERNIA REPAIR     LEFT HEART CATH AND CORS/GRAFTS ANGIOGRAPHY N/A 05/24/2020   Procedure: LEFT HEART CATH AND CORS/GRAFTS ANGIOGRAPHY;  Surgeon: Jettie Booze, MD;  Location: [facility] LAB;  Service: Cardiovascular;  Laterality: N/A;   prostate U/S + Biopsy  2002   RHINOPLASTY     several epidural injections     2004-2008   UPPER GASTROINTESTINAL ENDOSCOPY       Family History  Problem Relation Age of Onset   Osteoarthritis Mother    Hyperlipidemia Mother    Heart disease Mother    Heart attack Mother 69   Hyperlipidemia Father    Heart disease Father    Heart attack Father 71   Colonic polyp Father    Heart attack Brother 42   Diabetes Brother    Aneurysm Maternal Grandmother    Heart attack Maternal Grandmother    Aneurysm Maternal Grandfather    Heart attack Maternal Grandfather    Aneurysm Paternal Uncle    Colon cancer Neg Hx    Stomach cancer Neg Hx    Esophageal cancer Neg Hx    Rectal cancer Neg Hx      Social History   Socioeconomic History   Marital status: Married    Spouse name: [name]   Number of children: 0   Years of education: Not on file   Highest education level: Not on file  Occupational History   Occupation: Retired  Tobacco Use   Smoking status: Never   Smokeless tobacco: Never  Vaping Use   Vaping Use: Never used  Substance and Sexual Activity   Alcohol use: No    Alcohol/week: 0.0 standard drinks   Drug use: No   Sexual activity: Not on file  Other Topics Concern   Not on file  Social History Narrative   Married, wife is a Marine scientist, no children   Retired Microbiologist, flies as a hobby   2 caffeinated beverages daily   Social Determinants of Radio broadcast assistant Strain: Not on file  Food  Insecurity: Not on file  Transportation Needs: Not on file  Physical Activity: Not on file  Stress: Not on file  Social Connections: Not on file  Intimate Partner Violence: Not on file  BP 114/60    Pulse (!) 51    Ht 5\' 9"  (1.753 m)    Wt 142 lb (64.4 kg)    SpO2 96%    BMI 20.97 kg/m   Physical Exam:  stable appearing 76 yo man, NAD HEENT: Unremarkable Neck:  No JVD, no thyromegally Lymphatics: 6 cmo adenopathy Back:  No CVA tenderness Lungs:  Clear HEART:  Regular rate rhythm, no murmurs, no rubs, no clicks Abd:  soft, positive bowel sounds, no organomegally, no rebound, no guarding Ext:  2 plus pulses, no edema, no cyanosis, no clubbing Skin:  No rashes no nodules Neuro:  CN II through XII intact, motor grossly intact  EKG - sb at 51/min  Assess/Plan:  1. CAD - he is s/p CABG. He does not have angina. He will continue aggressive medical therapy. I encouraged him to stop the imdur.  2. HTN - his bp is well controlled. It tends to be low and I asked him to stop the imdur.  3. Dyslipidemia - he will continue crestor.  Royston Sinner Kenly Xiao,MD

## 2021-07-19 DIAGNOSIS — N401 Enlarged prostate with lower urinary tract symptoms: Secondary | ICD-10-CM | POA: Diagnosis not present

## 2021-07-19 DIAGNOSIS — E291 Testicular hypofunction: Secondary | ICD-10-CM | POA: Diagnosis not present

## 2021-07-26 DIAGNOSIS — E291 Testicular hypofunction: Secondary | ICD-10-CM | POA: Diagnosis not present

## 2021-07-26 DIAGNOSIS — R3912 Poor urinary stream: Secondary | ICD-10-CM | POA: Diagnosis not present

## 2021-07-26 DIAGNOSIS — N401 Enlarged prostate with lower urinary tract symptoms: Secondary | ICD-10-CM | POA: Diagnosis not present

## 2021-07-26 DIAGNOSIS — R351 Nocturia: Secondary | ICD-10-CM | POA: Diagnosis not present

## 2021-07-26 DIAGNOSIS — R3915 Urgency of urination: Secondary | ICD-10-CM | POA: Diagnosis not present

## 2021-08-02 ENCOUNTER — Ambulatory Visit (INDEPENDENT_AMBULATORY_CARE_PROVIDER_SITE_OTHER): Payer: Medicare Other | Admitting: Physician Assistant

## 2021-08-02 ENCOUNTER — Ambulatory Visit (INDEPENDENT_AMBULATORY_CARE_PROVIDER_SITE_OTHER): Payer: Medicare Other

## 2021-08-02 ENCOUNTER — Encounter: Payer: Self-pay | Admitting: Physician Assistant

## 2021-08-02 ENCOUNTER — Other Ambulatory Visit: Payer: Self-pay

## 2021-08-02 DIAGNOSIS — M25551 Pain in right hip: Secondary | ICD-10-CM | POA: Diagnosis not present

## 2021-08-02 NOTE — Progress Notes (Signed)
Office Visit Note   Patient: Willie Garcia           Date of Birth: 09/26/1945           MRN: [id number] Visit Date: 08/02/2021              Requested by: Prince Solian, MD [address] [place],  [place] [address] PCP: Prince Solian, MD  Chief Complaint  Patient presents with   Right Hip - Pain      HPI: The patient is a very pleasant 76 year old gentleman with a chief complaint of what he believes to be right hip pain.  He has a history of lymphoma and heart disease.  He also has a significant history of spinal degeneration with scoliosis.  He has foraminal impingement at L4-5 and L5-S1 and stenosis.  His wife who accompanies with him says that he has had increasing burning going down the right greater than left outside of his leg.  He denies any recent injury he denies any loss of bowel or bladder control.  He does have a little bit of difficulty walking because when he stands up he has quite a bit of pain.  Also has pain in bed at night.  He cannot take any anti-inflammatories or straight steroids because of GI issues.  He is currently taking gabapentin that is been prescribed for him however he admits he is not sure if he has been taking this regularly.  He is a patient of Dr. [name] and he and his wife both get epidural steroid injections by Dr. Davy Pique in the same office.  His last injection was March 2022.  He said the only thing that gives him relief from the burning is using his inversion table  Assessment & Plan: Visit Diagnoses:  Lumbar radiculopathy.  Plan: I think findings with exam today are more consistent with his back rather than his hip.  He does not have any groin pain and has rather good motion that is painless of his hip.  He does have some tenderness in the posterior buttock with radiation down the right greater than left knee.  His strength however is good and equal.  His wife is going to call Dr. Davy Pique to see if he can get in for another injection.  Of  course if this did not help we could reconsider the hip but I do not think this is the cause of his difficulties.  I have talked to he and his wife about him using a cane or a walker at least until he can get an injection because the pain does cause him to be a little unsteady.  We also discussed physical therapy but he declines this at this time  Follow-Up Instructions: No follow-ups on file.   Ortho Exam  Patient is alert, oriented, no adenopathy, well-dressed, normal affect, normal respiratory effort. Examination he has no palpable deformity or pain along his spine.  He does have some tenderness with deep palpation of the posterior buttock on the right which does send some burning down his leg.  He has a negative straight leg raise bilaterally.  He has 5 out of 5 strength with dorsiflexion plantarflexion resisted flexion and extension of his leg and flexion of his hip.  He has no pain with internal/external rotation of his hip and overall his hip motion is fairly unrestricted  Imaging: No results found. No images are attached to the encounter.  Labs: Lab Results  Component Value Date   ESRSEDRATE  4 01/09/2013   LABURIC 6.1 07/02/2011   LABURIC 4.9 03/28/2011   LABURIC 4.7 01/05/2011   GRAMSTAIN Rare 11/09/2016   GRAMSTAIN WBC present-predominately PMN 11/09/2016   GRAMSTAIN No Squamous Epithelial Cells Seen 11/09/2016   GRAMSTAIN  11/09/2016    Abundant GRAM POSITIVE COCCI IN PAIRS In Clusters In [place] 11/09/2016     Lab Results  Component Value Date   ALBUMIN 3.8 03/07/2021   ALBUMIN 3.8 01/18/2021   ALBUMIN 4.0 01/21/2020    No results found for: MG No results found for: VD25OH  No results found for: PREALBUMIN CBC EXTENDED Latest Ref Rng & Units 03/07/2021 01/18/2021 05/17/2020  WBC 4.0 - 10.5 K/uL 5.7 8.1 7.9  RBC 4.22 - 5.81 MIL/uL 4.24 3.94(L) 4.15  HGB 13.0 - 17.0 g/dL 13.2 12.3(L) 13.1  HCT 39.0 - 52.0 % 39.2  36.3(L) 38.9  PLT 150 - 400 K/uL 246 201 256  NEUTROABS 1.7 - 7.7 K/uL 3.4 5.2 5.3  LYMPHSABS 0.7 - 4.0 K/uL 1.4 1.8 1.8     There is no height or weight on file to calculate BMI.  Orders:  Orders Placed This Encounter  Procedures   XR Pelvis 1-2 Views   No orders of the defined types were placed in this encounter.    Procedures: No procedures performed  Clinical Data: No additional findings.  ROS:  All other systems negative, except as noted in the HPI. Review of Systems  Objective: Vital Signs: There were no vitals taken for this visit.  Specialty Comments:  No specialty comments available.  PMFS History: Patient Active Problem List   Diagnosis Date Noted   Intermetacarpal ligament injury, initial encounter 06/16/2019   Sebaceous cyst - perianal 11/04/2018   Pain in left hip 08/29/2018   Pain of left great toe 08/29/2018   Chronic pain 01/22/2018   Aortic atherosclerosis ([place]) 01/21/2018   Chest pain at rest    Abnormal nuclear stress test 07/14/2014   Chest pain with high risk for cardiac etiology - Atypical Pain, but abnormal Myoview. 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   Abdominal bruit 10/09/2010   Coronary atherosclerosis 01/18/2009   DYSLIPIDEMIA 10/02/2008   Essential hypertension 10/02/2008   Past Medical History:  Diagnosis Date   Allergic rhinitis    Anemia    Anxiety    Basal cell carcinoma of skin 2015   BPH (benign prostatic hyperplasia)    CAD (coronary artery disease)    Depression    Ganglion cyst    left thumb   GERD (gastroesophageal reflux disease)    HTN (hypertension)    Hyperlipidemia    Left inguinal hernia    Non Hodgkin's lymphoma (HCC)    Osteopenia    Personal history of colonic adenomas 04/04/2010   Scoliosis    Spinal  stenosis    30 degree curve in back    Family History  Problem Relation Age of Onset   Osteoarthritis Mother    Hyperlipidemia Mother    Heart disease Mother    Heart attack Mother 46   Hyperlipidemia Father    Heart disease Father    Heart attack Father 26   Colonic polyp Father    Heart attack Brother 59   Diabetes Brother    Aneurysm Maternal Grandmother  Heart attack Maternal Grandmother    Aneurysm Maternal Grandfather    Heart attack Maternal Grandfather    Aneurysm Paternal Uncle    Colon cancer Neg Hx    Stomach cancer Neg Hx    Esophageal cancer Neg Hx    Rectal cancer Neg Hx     Past Surgical History:  Procedure Laterality Date   basal cell cancer removed left eyelid   2019   CARDIAC CATHETERIZATION  07/14/2014   Procedure: LEFT HEART CATH AND CORS/GRAFTS ANGIOGRAPHY;  Surgeon: Jettie Booze, MD;  Location: [facility] CATH LAB;  Service: Cardiovascular;;   CARDIAC CATHETERIZATION  07/14/2014   Procedure: INTRAVASCULAR PRESSURE WIRE/FFR STUDY;  Surgeon: Jettie Booze, MD;  Location: [facility] CATH LAB;  Service: Cardiovascular;;  circ   CERVICAL SPINE SURGERY     C4-C6   COLONOSCOPY  06/18/2013   negative   CORONARY ARTERY BYPASS GRAFT  1999   DEXA     HERNIA REPAIR     LEFT HEART CATH AND CORS/GRAFTS ANGIOGRAPHY N/A 05/24/2020   Procedure: LEFT HEART CATH AND CORS/GRAFTS ANGIOGRAPHY;  Surgeon: Jettie Booze, MD;  Location: [facility] CV LAB;  Service: Cardiovascular;  Laterality: N/A;   prostate U/S + Biopsy  2002   RHINOPLASTY     several epidural injections     2004-2008   UPPER GASTROINTESTINAL ENDOSCOPY     Social History   Occupational History   Occupation: Retired  Tobacco Use   Smoking status: Never   Smokeless tobacco: Never  Vaping Use   Vaping Use: Never used  Substance and Sexual Activity   Alcohol use: No    Alcohol/week: 0.0 standard drinks   Drug use: No   Sexual activity: Not on file

## 2021-08-16 ENCOUNTER — Ambulatory Visit: Payer: Medicare Other | Admitting: Orthopaedic Surgery

## 2021-08-16 DIAGNOSIS — M5416 Radiculopathy, lumbar region: Secondary | ICD-10-CM | POA: Diagnosis not present

## 2021-09-11 DIAGNOSIS — M5416 Radiculopathy, lumbar region: Secondary | ICD-10-CM | POA: Diagnosis not present

## 2021-09-15 ENCOUNTER — Other Ambulatory Visit: Payer: Self-pay | Admitting: Internal Medicine

## 2021-10-17 DIAGNOSIS — L97821 Non-pressure chronic ulcer of other part of left lower leg limited to breakdown of skin: Secondary | ICD-10-CM | POA: Diagnosis not present

## 2021-10-17 DIAGNOSIS — L738 Other specified follicular disorders: Secondary | ICD-10-CM | POA: Diagnosis not present

## 2021-10-17 DIAGNOSIS — L570 Actinic keratosis: Secondary | ICD-10-CM | POA: Diagnosis not present

## 2021-10-17 DIAGNOSIS — L821 Other seborrheic keratosis: Secondary | ICD-10-CM | POA: Diagnosis not present

## 2021-10-17 DIAGNOSIS — L98499 Non-pressure chronic ulcer of skin of other sites with unspecified severity: Secondary | ICD-10-CM | POA: Diagnosis not present

## 2021-10-17 DIAGNOSIS — D1801 Hemangioma of skin and subcutaneous tissue: Secondary | ICD-10-CM | POA: Diagnosis not present

## 2021-10-17 DIAGNOSIS — Z85828 Personal history of other malignant neoplasm of skin: Secondary | ICD-10-CM | POA: Diagnosis not present

## 2021-12-06 DIAGNOSIS — M5416 Radiculopathy, lumbar region: Secondary | ICD-10-CM | POA: Diagnosis not present

## 2021-12-06 DIAGNOSIS — M4126 Other idiopathic scoliosis, lumbar region: Secondary | ICD-10-CM | POA: Diagnosis not present

## 2021-12-17 ENCOUNTER — Other Ambulatory Visit: Payer: Self-pay | Admitting: Internal Medicine

## 2021-12-22 DIAGNOSIS — M5416 Radiculopathy, lumbar region: Secondary | ICD-10-CM | POA: Diagnosis not present

## 2021-12-25 ENCOUNTER — Ambulatory Visit (INDEPENDENT_AMBULATORY_CARE_PROVIDER_SITE_OTHER): Payer: Medicare Other | Admitting: Podiatry

## 2021-12-25 ENCOUNTER — Ambulatory Visit (INDEPENDENT_AMBULATORY_CARE_PROVIDER_SITE_OTHER): Payer: Medicare Other

## 2021-12-25 DIAGNOSIS — L600 Ingrowing nail: Secondary | ICD-10-CM | POA: Diagnosis not present

## 2021-12-25 DIAGNOSIS — M2041 Other hammer toe(s) (acquired), right foot: Secondary | ICD-10-CM | POA: Diagnosis not present

## 2021-12-25 DIAGNOSIS — M79675 Pain in left toe(s): Secondary | ICD-10-CM

## 2021-12-25 DIAGNOSIS — M79674 Pain in right toe(s): Secondary | ICD-10-CM | POA: Diagnosis not present

## 2021-12-25 DIAGNOSIS — M2042 Other hammer toe(s) (acquired), left foot: Secondary | ICD-10-CM

## 2021-12-25 DIAGNOSIS — B351 Tinea unguium: Secondary | ICD-10-CM | POA: Diagnosis not present

## 2021-12-25 DIAGNOSIS — I2510 Atherosclerotic heart disease of native coronary artery without angina pectoris: Secondary | ICD-10-CM

## 2021-12-25 MED ORDER — CEPHALEXIN 500 MG PO CAPS: 500.0000 mg | ORAL_CAPSULE | Freq: Three times a day (TID) | ORAL | 0 refills | Status: DC

## 2021-12-25 NOTE — Patient Instructions (Signed)

## 2021-12-28 NOTE — Progress Notes (Signed)
Subjective:   Patient ID: Willie Garcia, male   DOB: 76 y.o.   MRN: [id number]   HPI 76 year old male presents the office today with concerns of an ingrown toenail left lateral fourth digit toenail.  He previously had ingrown toenails performed on the big toes bilaterally.  Also he has hammertoes.  He also toenails be trimmed is a thickened elongated causing discomfort.  He does note some local swelling redness along the left lateral fourth nail but no other areas of edema, erythema.   Review of Systems  All other systems reviewed and are negative.  Past Medical History:  Diagnosis Date   Allergic rhinitis    Anemia    Anxiety    Basal cell carcinoma of skin 2015   BPH (benign prostatic hyperplasia)    CAD (coronary artery disease)    Depression    Ganglion cyst    left thumb   GERD (gastroesophageal reflux disease)    HTN (hypertension)    Hyperlipidemia    Left inguinal hernia    Non Hodgkin's lymphoma ([place])    Osteopenia    Personal history of colonic adenomas 04/04/2010   Scoliosis    Spinal stenosis    30 degree curve in back    Past Surgical History:  Procedure Laterality Date   basal cell cancer removed left eyelid   2019   CARDIAC CATHETERIZATION  07/14/2014   Procedure: LEFT HEART CATH AND CORS/GRAFTS ANGIOGRAPHY;  Surgeon: Jettie Booze, MD;  Location: [facility] CATH LAB;  Service: Cardiovascular;;   CARDIAC CATHETERIZATION  07/14/2014   Procedure: INTRAVASCULAR PRESSURE WIRE/FFR STUDY;  Surgeon: Jettie Booze, MD;  Location: [facility] CATH LAB;  Service: Cardiovascular;;  circ   CERVICAL SPINE SURGERY     C4-C6   COLONOSCOPY  06/18/2013   negative   CORONARY ARTERY BYPASS GRAFT  1999   DEXA     HERNIA REPAIR     LEFT HEART CATH AND CORS/GRAFTS ANGIOGRAPHY N/A 05/24/2020   Procedure: LEFT HEART CATH AND CORS/GRAFTS ANGIOGRAPHY;  Surgeon: Jettie Booze, MD;  Location: [facility] CV LAB;  Service: Cardiovascular;  Laterality: N/A;   prostate U/S +  Biopsy  2002   RHINOPLASTY     several epidural injections     2004-2008   UPPER GASTROINTESTINAL ENDOSCOPY       Current Outpatient Medications:    cephALEXin (KEFLEX) 500 MG capsule, Take 1 capsule (500 mg total) by mouth 3 (three) times daily., Disp: 21 capsule, Rfl: 0   acetaminophen (TYLENOL) 500 MG tablet, Take 500 mg by mouth every 6 (six) hours as needed (pain)., Disp: , Rfl:    Ascorbic Acid (VITAMIN C) 1000 MG tablet, Take 1,000 mg by mouth daily., Disp: , Rfl:    aspirin EC 81 MG tablet, Take 81 mg by mouth every Monday, Tuesday, Wednesday, Thursday, and Friday. Swallow whole., Disp: , Rfl:    CALCIUM-VITAMIN D PO, Take 1 tablet by mouth 3 (three) times a week. , Disp: , Rfl:    cyanocobalamin (,VITAMIN B-12,) 1000 MCG/ML injection, Inject 1,000 mcg into the muscle every 30 (thirty) days., Disp: , Rfl:    dicyclomine (BENTYL) 20 MG tablet, TAKE ONE TABLET EVERY 6 HOURS AS NEEDED FOR SPASMS., Disp: 120 tablet, Rfl: 0   ezetimibe (ZETIA) 10 MG tablet, Take 10 mg by mouth 3 (three) times a week. , Disp: , Rfl:    fluticasone (FLONASE) 50 MCG/ACT nasal spray, Place 2 sprays into both nostrils daily as needed for  allergies or rhinitis. , Disp: , Rfl:    folic acid (FOLVITE) 1 MG tablet, Take 1 mg by mouth 3 (three) times a week., Disp: , Rfl:    gabapentin (NEURONTIN) 300 MG capsule, TAKE 1 CAPSULE AT BEDTIME., Disp: 90 capsule, Rfl: 0   HYDROcodone-acetaminophen (NORCO/VICODIN) 5-325 MG tablet, Take 1 tablet by mouth 2 (two) times daily as needed (back pain.). , Disp: , Rfl:    isosorbide mononitrate (IMDUR) 30 MG 24 hr tablet, TAKE (1/2) TABLET DAILY., Disp: 45 tablet, Rfl: 3   loratadine (CLARITIN) 10 MG tablet, Take 10 mg by mouth daily as needed for allergies., Disp: , Rfl:    metoCLOPramide (REGLAN) 5 MG tablet, TAKE 1 TABLET THREE TIMES DAILY BEFORE MEALS AS NEEDED., Disp: 90 tablet, Rfl: 0   metoprolol succinate (TOPROL XL) 25 MG 24 hr tablet, Take 0.5 tablets (12.5 mg total) by  mouth at bedtime., Disp: 45 tablet, Rfl: 3   nitroGLYCERIN (NITROSTAT) 0.4 MG SL tablet, Place 1 tablet (0.4 mg total) under the tongue every 5 (five) minutes as needed for chest pain., Disp: 30 tablet, Rfl: 3   pantoprazole (PROTONIX) 20 MG tablet, TAKE 1 TABLET DAILY BEFORE BREAKFAST., Disp: 30 tablet, Rfl: 0   rosuvastatin (CRESTOR) 20 MG tablet, Take 20 mg by mouth 2 (two) times a week., Disp: , Rfl:    silodosin (RAPAFLO) 8 MG CAPS capsule, Take 8 mg by mouth at bedtime as needed (difficulty urinating). , Disp: , Rfl:    testosterone cypionate (DEPOTESTOTERONE CYPIONATE) 200 MG/ML injection, Inject 50 mg into the muscle once a week. AS DIRECTED (0.25 ml), Disp: , Rfl:   Allergies  Allergen Reactions   Lipitor [Atorvastatin]     Weakness, myalgia           Objective:  Physical Exam  General: AAO x3, NAD  Dermatological: Incurvation present lateral aspect left fourth toenail with localized edema erythema there is no ascending cellulitis but there is no drainage or pus.  Incurvation of the nail border.  Previously had medial nail border removals of bilateral hallux toenails but no significant recurrence of ingrown toenail at this time.  General nails are hypertrophic, dystrophic with yellow, brown discoloration.  No edema, erythema to the toenail sites.  No open lesions.  Vascular: Dorsalis Pedis artery and Posterior Tibial artery pedal pulses are palpable bilateral with immedate capillary fill time. There is no pain with calf compression, swelling, warmth, erythema.   Neruologic: Grossly intact via light touch bilateral.   Musculoskeletal: Hammertoes present.  Muscular strength 5/5 in all groups tested bilateral.  Gait: Unassisted, Nonantalgic.       Assessment:   Hammertoes present, ingrown toenail left lateral fourth nail border, symptomatic onychomycosis     Plan:  -Treatment options discussed including all alternatives, risks, and complications. -Etiology of symptoms  were discussed -X-rays obtained reviewed.  3 views of bilateral feet were obtained.  Noted subacute fracture.  Hammertoes present. -At this time, the patient is requesting partial nail removal with chemical matricectomy to the symptomatic portion of the nail. Risks and complications were discussed with the patient for which they understand and written consent was obtained. Under sterile conditions a total of 3 mL of a mixture of 2% lidocaine plain and 0.5% Marcaine plain was infiltrated in a hallux block fashion. Once anesthetized, the skin was prepped in sterile fashion. A tourniquet was then applied. Next the lateral aspect of hallux nail border was then sharply excised making sure to remove the entire offending  nail border. Once the nails were ensured to be removed area was debrided and the underlying skin was intact. There is no purulence identified in the procedure. Next phenol was then applied under standard conditions and copiously irrigated. Silvadene was applied. A dry sterile dressing was applied. After application of the dressing the tourniquet was removed and there is found to be an immediate capillary refill time to the digit. The patient tolerated the procedure well any complications. Post procedure instructions were discussed the patient for which he verbally understood.  Discussed signs/symptoms of infection and directed to call the office immediately should any occur or go directly to the emergency room. In the meantime, encouraged to call the office with any questions, concerns, changes symptoms. -Keflex -Continue offloading, supportive shoes for the hammertoes -Did not proceed with any further partial nail avulsions of the hallux toenail cyst that appears there is no significant recurrence or signs of infection. -Debrided nails x9 without any complications or bleeding to the other nails.    Trula Slade DPM

## 2022-01-08 ENCOUNTER — Other Ambulatory Visit: Payer: Self-pay | Admitting: Internal Medicine

## 2022-01-11 ENCOUNTER — Other Ambulatory Visit: Payer: Medicare Other

## 2022-01-19 DIAGNOSIS — E291 Testicular hypofunction: Secondary | ICD-10-CM | POA: Diagnosis not present

## 2022-01-26 DIAGNOSIS — E291 Testicular hypofunction: Secondary | ICD-10-CM | POA: Diagnosis not present

## 2022-01-26 DIAGNOSIS — R3915 Urgency of urination: Secondary | ICD-10-CM | POA: Diagnosis not present

## 2022-01-26 DIAGNOSIS — N5201 Erectile dysfunction due to arterial insufficiency: Secondary | ICD-10-CM | POA: Diagnosis not present

## 2022-01-26 DIAGNOSIS — R3912 Poor urinary stream: Secondary | ICD-10-CM | POA: Diagnosis not present

## 2022-01-26 DIAGNOSIS — N401 Enlarged prostate with lower urinary tract symptoms: Secondary | ICD-10-CM | POA: Diagnosis not present

## 2022-02-15 ENCOUNTER — Ambulatory Visit (INDEPENDENT_AMBULATORY_CARE_PROVIDER_SITE_OTHER): Payer: Medicare Other | Admitting: Physician Assistant

## 2022-02-15 ENCOUNTER — Encounter: Payer: Self-pay | Admitting: Physician Assistant

## 2022-02-15 VITALS — BP 118/82 | HR 62 | Ht 69.0 in | Wt 142.0 lb

## 2022-02-15 DIAGNOSIS — R142 Eructation: Secondary | ICD-10-CM | POA: Diagnosis not present

## 2022-02-15 DIAGNOSIS — K5900 Constipation, unspecified: Secondary | ICD-10-CM | POA: Diagnosis not present

## 2022-02-15 DIAGNOSIS — R634 Abnormal weight loss: Secondary | ICD-10-CM | POA: Diagnosis not present

## 2022-02-15 DIAGNOSIS — I2510 Atherosclerotic heart disease of native coronary artery without angina pectoris: Secondary | ICD-10-CM | POA: Diagnosis not present

## 2022-02-15 MED ORDER — METOCLOPRAMIDE HCL 5 MG PO TABS: 5.0000 mg | ORAL_TABLET | Freq: Three times a day (TID) | ORAL | 3 refills | Status: DC

## 2022-02-15 NOTE — Progress Notes (Signed)
Chief Complaint: Abdominal pain and no appetite  HPI:    [name] is a 76 year old Caucasian male with a past medical history as listed below including CAD status post cath 05/24/2020 with normal LVEF 50-55%, anxiety, GERD, non-Hodgkin's lymphoma and multiple others, known to Dr. [name], who presents to clinic today with a complaint of abdominal pain and no appetite.    11/04/2018 office visit with Dr. [name] to discuss dysphagia.  As well as his history of adenomatous polyps.  At that time recommended repeat colonoscopy.    11/06/2018 colonoscopy with 2 diminutive polyps in the sigmoid and ascending colon, external and internal hemorrhoids and otherwise normal.  Pathology showed 2 diminutive adenomas.  No recall was recommended due to age.    12/06/2021 [facility] for follow-up of idiopathic scoliosis of the lumbar region.  He has steroid injections to manage symptoms.    Today, the patient presents to clinic accompanied by his daughter who is a Marine scientist.  She is obviously more worried about his symptoms than him.  He tells me that he has epigastric pain off-and-on but it is never very constant and seems like gas pain because it moves around very quickly.  Also have very occasional reflux "hardly ever" as well as a lot of belching and burping and gas per his daughter.  He tells me he does struggle to have a bowel movement and oftentimes has to strain.  Tells me he really likes sweet tea and really does not drink water and knows that is a problem.  Describes that he has always eaten in very small amounts his entire life and has never had a very big appetite so nothing has changed recently.  He tells me he eats very slow and very often will only eat a half of a meal because he does not feel like he needs to rest.  Tells me he has always been a very tall and "slim man", per his daughter in the room his weight can get as high as 150 pounds, currently 142.  The  patient tells me he seems to lose a little more weight in the summer when he is out and about and then it comes back on in the winter.  She describes that he was on Reglan 5 mg as needed in the past due to some slowed digestion.  Patient tells me this seemed to help him occasionally.  He would only use it as needed.  She also tells me that he eats Tums throughout the day, he tells me he does not really think he has reflux or anything, it is just more of a habit and he likes the way they taste.  He has Pantoprazole 20 mg at home but does not use this regularly.    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  Daughter thinks he is slightly depressed due to this.    Denies fever, chills, blood in his stool or change in bowel habits.  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    Past Surgical History:  Procedure Laterality Date   basal cell cancer removed left eyelid   2019   CARDIAC CATHETERIZATION  07/14/2014   Procedure: LEFT HEART CATH AND CORS/GRAFTS ANGIOGRAPHY;  Surgeon: Jettie Booze, MD;  Location: [facility] CATH LAB;  Service: Cardiovascular;;   CARDIAC CATHETERIZATION  07/14/2014   Procedure: INTRAVASCULAR PRESSURE WIRE/FFR STUDY;  Surgeon: Jettie Booze, MD;  Location: [facility] CATH LAB;  Service: Cardiovascular;;  circ   CERVICAL SPINE SURGERY     C4-C6   COLONOSCOPY  06/18/2013   negative   CORONARY ARTERY BYPASS GRAFT  1999   DEXA     HERNIA REPAIR      LEFT HEART CATH AND CORS/GRAFTS ANGIOGRAPHY N/A 05/24/2020   Procedure: LEFT HEART CATH AND CORS/GRAFTS ANGIOGRAPHY;  Surgeon: Jettie Booze, MD;  Location: [facility] CV LAB;  Service: Cardiovascular;  Laterality: N/A;   prostate U/S + Biopsy  2002   RHINOPLASTY     several epidural injections     2004-2008   UPPER GASTROINTESTINAL ENDOSCOPY      Current Outpatient Medications  Medication Sig Dispense Refill   acetaminophen (TYLENOL) 500 MG tablet Take 500 mg by mouth every 6 (six) hours as needed (pain).     Ascorbic Acid (VITAMIN C) 1000 MG tablet Take 1,000 mg by mouth daily.     aspirin EC 81 MG tablet Take 81 mg by mouth every Monday, Tuesday, Wednesday, Thursday, and Friday. Swallow whole.     CALCIUM-VITAMIN D PO Take 1 tablet by mouth 3 (three) times a week.      cephALEXin (KEFLEX) 500 MG capsule Take 1 capsule (500 mg total) by mouth 3 (three) times daily. 21 capsule 0   cyanocobalamin (,VITAMIN B-12,) 1000 MCG/ML injection Inject 1,000 mcg into the muscle every 30 (thirty) days.     dicyclomine (BENTYL) 20 MG tablet TAKE ONE TABLET EVERY 6 HOURS AS NEEDED FOR SPASMS. 120 tablet 0   ezetimibe (ZETIA) 10 MG tablet Take 10 mg by mouth 3 (three) times a week.      fluticasone (FLONASE) 50 MCG/ACT nasal spray Place 2 sprays into both nostrils daily as needed for allergies or rhinitis.      folic acid (FOLVITE) 1 MG tablet Take 1 mg by mouth 3 (three) times a week.     gabapentin (NEURONTIN) 300 MG capsule TAKE 1 CAPSULE AT BEDTIME. 90 capsule 0   HYDROcodone-acetaminophen (NORCO/VICODIN) 5-325 MG tablet Take 1 tablet by mouth 2 (two) times daily as needed (back pain.).      isosorbide mononitrate (IMDUR) 30 MG 24 hr tablet TAKE (1/2) TABLET DAILY. 45 tablet 1   loratadine (CLARITIN) 10 MG tablet Take 10 mg by mouth daily as needed for allergies.     metoCLOPramide (REGLAN) 5 MG tablet TAKE 1 TABLET THREE TIMES DAILY BEFORE MEALS AS NEEDED. 90 tablet 0   metoprolol succinate  (TOPROL XL) 25 MG 24 hr tablet Take 0.5 tablets (12.5 mg total) by mouth at bedtime. 45 tablet 3   nitroGLYCERIN (NITROSTAT) 0.4 MG SL tablet Place 1 tablet (0.4 mg total) under the tongue every 5 (five) minutes as needed for chest pain. 30 tablet 3   pantoprazole (PROTONIX) 20 MG tablet TAKE 1 TABLET DAILY BEFORE BREAKFAST.  30 tablet 0   rosuvastatin (CRESTOR) 20 MG tablet Take 20 mg by mouth 2 (two) times a week.     silodosin (RAPAFLO) 8 MG CAPS capsule Take 8 mg by mouth at bedtime as needed (difficulty urinating).      testosterone cypionate (DEPOTESTOTERONE CYPIONATE) 200 MG/ML injection Inject 50 mg into the muscle once a week. AS DIRECTED (0.25 ml)     No current facility-administered medications for this visit.    Allergies as of 02/15/2022 - Review Complete 08/02/2021  Allergen Reaction Noted   Lipitor [atorvastatin]  05/23/2021    Family History  Problem Relation Age of Onset   Osteoarthritis Mother    Hyperlipidemia Mother    Heart disease Mother    Heart attack Mother 101   Hyperlipidemia Father    Heart disease Father    Heart attack Father 89   Colonic polyp Father    Heart attack Brother 31   Diabetes Brother    Aneurysm Maternal Grandmother    Heart attack Maternal Grandmother    Aneurysm Maternal Grandfather    Heart attack Maternal Grandfather    Aneurysm Paternal Uncle    Colon cancer Neg Hx    Stomach cancer Neg Hx    Esophageal cancer Neg Hx    Rectal cancer Neg Hx     Social History   Socioeconomic History   Marital status: Married    Spouse name: [name]   Number of children: 0   Years of education: Not on file   Highest education level: Not on file  Occupational History   Occupation: Retired  Tobacco Use   Smoking status: Never   Smokeless tobacco: Never  Vaping Use   Vaping Use: Never used  Substance and Sexual Activity   Alcohol use: No    Alcohol/week: 0.0 standard drinks of alcohol   Drug use: No   Sexual activity: Not on file   Other Topics Concern   Not on file  Social History Narrative   Married, wife is a Marine scientist, no children   Retired Microbiologist, flies as a hobby   2 caffeinated beverages daily   Social Determinants of Radio broadcast assistant Strain: Not on Art therapist Insecurity: Not on file  Transportation Needs: Not on file  Physical Activity: Not on file  Stress: Not on file  Social Connections: Not on file  Intimate Partner Violence: Not on file    Review of Systems:    Constitutional: No fever or chills Skin: No rash  Cardiovascular: No chest pain  Respiratory: No SOB  Gastrointestinal: See HPI and otherwise negative Genitourinary: No dysuria  Neurological: No headache, dizziness or syncope Musculoskeletal: No new muscle or joint pain Hematologic: No bleeding Psychiatric: No history of depression or anxiety   Physical Exam:  Vital signs: BP 118/82   Pulse 62   Ht '5\' 9"'$  (1.753 m)   Wt 142 lb (64.4 kg)   SpO2 94%   BMI 20.97 kg/m    Constitutional:   Pleasant Elderly Caucasian male appears to be in NAD, Well developed, Well nourished, alert and cooperative Head:  Normocephalic and atraumatic. Eyes:   PEERL, EOMI. No icterus. Conjunctiva pink. Ears:  Normal auditory acuity. Neck:  Supple Throat: Oral cavity and pharynx without inflammation, swelling or lesion.  Respiratory: Respirations even and unlabored. Lungs clear to auscultation bilaterally.   No wheezes, crackles, or rhonchi.  Cardiovascular: Normal S1, S2. No MRG. Regular rate and rhythm. No peripheral edema, cyanosis or pallor.  Gastrointestinal:  Soft, nondistended, nontender. No rebound or guarding. Normal bowel sounds. No appreciable masses or hepatomegaly. Rectal:  Not performed.  Msk:  Symmetrical without gross deformities. Without edema, no deformity or joint abnormality.  Neurologic:  Alert and  oriented x4;  grossly normal neurologically.  Skin:   Dry and intact without significant lesions or  rashes. Psychiatric: Demonstrates good judgement and reason without abnormal affect or behaviors.  RELEVANT LABS AND IMAGING: CBC    Component Value Date/Time   WBC 5.7 03/07/2021 1048   RBC 4.24 03/07/2021 1048   HGB 13.2 03/07/2021 1048   HGB 13.1 05/17/2020 1246   HGB 13.1 01/03/2017 1033   HCT 39.2 03/07/2021 1048   HCT 38.9 05/17/2020 1246   HCT 38.1 (L) 01/03/2017 1033   PLT 246 03/07/2021 1048   PLT 256 05/17/2020 1246   MCV 92.5 03/07/2021 1048   MCV 94 05/17/2020 1246   MCV 91.9 01/03/2017 1033   MCH 31.1 03/07/2021 1048   MCHC 33.7 03/07/2021 1048   RDW 12.9 03/07/2021 1048   RDW 13.6 05/17/2020 1246   RDW 13.6 01/03/2017 1033   LYMPHSABS 1.4 03/07/2021 1048   LYMPHSABS 1.8 05/17/2020 1246   LYMPHSABS 1.3 01/03/2017 1033   MONOABS 0.5 03/07/2021 1048   MONOABS 0.5 01/03/2017 1033   EOSABS 0.3 03/07/2021 1048   EOSABS 0.1 05/17/2020 1246   BASOSABS 0.0 03/07/2021 1048   BASOSABS 0.0 05/17/2020 1246   BASOSABS 0.0 01/03/2017 1033    CMP     Component Value Date/Time   NA 141 03/07/2021 1048   NA 140 05/17/2020 1246   NA 140 01/03/2017 1033   K 4.5 03/07/2021 1048   K 4.1 01/03/2017 1033   CL 104 03/07/2021 1048   CL 101 06/30/2012 1325   CO2 29 03/07/2021 1048   CO2 28 01/03/2017 1033   GLUCOSE 90 03/07/2021 1048   GLUCOSE 89 01/03/2017 1033   GLUCOSE 76 06/30/2012 1325   BUN 10 03/07/2021 1048   BUN 10 05/17/2020 1246   BUN 17.5 01/03/2017 1033   CREATININE 1.12 03/07/2021 1048   CREATININE 1.17 01/15/2019 1138   CREATININE 1.0 01/03/2017 1033   CALCIUM 9.2 03/07/2021 1048   CALCIUM 9.4 01/03/2017 1033   PROT 6.6 03/07/2021 1048   PROT 6.7 01/03/2017 1033   ALBUMIN 3.8 03/07/2021 1048   ALBUMIN 3.9 01/03/2017 1033   AST 12 (L) 03/07/2021 1048   AST 14 (L) 01/15/2019 1138   AST 13 01/03/2017 1033   ALT <6 03/07/2021 1048   ALT 9 01/15/2019 1138   ALT 8 01/03/2017 1033   ALKPHOS 89 03/07/2021 1048   ALKPHOS 96 01/03/2017 1033   BILITOT  0.7 03/07/2021 1048   BILITOT 0.7 01/15/2019 1138   BILITOT 0.92 01/03/2017 1033   GFRNONAA >60 03/07/2021 1048   GFRNONAA >60 01/15/2019 1138   GFRAA 90 05/17/2020 1246   GFRAA >60 01/15/2019 1138    Assessment: 1.  GERD: does tend to chew Tums regularly, not sure if it is because he likes the taste or if he is actually having reflux symptoms, but does have a lot of eructations; consider gastritis 2.  Epigastric pain: Very occasional per patient, likely with above 3.  Constipation: Likely related to Tums intake and decreased water intake +/- age 1.  Weight loss: Per daughter he has been losing weight, around 8 pounds, patient tells me he always loses weight in the summer  Plan: 1.  Patient is not worried about any of  his symptoms today.  Recommend that we try conservative measures and bring him back in a couple months to see if he has continued to lose weight.  If so then would recommend a CT of the abdomen pelvis and further labs and blood work. 2.  For now patient will start his Pantoprazole 20 mg every morning, 30 minutes before breakfast and take it every day for the next 2 months.  He has enough of this medicine at home. 3.  Patient will discontinue use of Tums as I feel like it is adding to his constipation. 4.  Discussed importance of water intake 6-8 8 ounce glasses of water per day. 5.  Encouraged the patient to get out and walk as much as he is able. 6.  Refilled Reglan 5 mg as needed #30 with 3 refills. 7.  Discussed using MiraLAX in the future if needed. 8.  Patient to follow in clinic in 2 months with me.  Ellouise Newer, PA-C [facility] 02/15/2022, 11:39 AM

## 2022-02-15 NOTE — Patient Instructions (Signed)
We have sent the following medications to your pharmacy for you to pick up at your convenience: Reglan 5 mg three times daily before meals.  Start Pantoprazole 20 mg daily 30-60 minutes before breakfast.   Decrease Tums usage.   _______________________________________________________  If you are age 76 or older, your body mass index should be between 23-30. Your Body mass index is 20.97 kg/m. If this is out of the aforementioned range listed, please consider follow up with your Primary Care Provider.  If you are age 40 or younger, your body mass index should be between 19-25. Your Body mass index is 20.97 kg/m. If this is out of the aformentioned range listed, please consider follow up with your Primary Care Provider.   ________________________________________________________  The [place] [facility] providers would like to encourage you to use [facility] to communicate with providers for non-urgent requests or questions.  Due to long hold times on the telephone, sending your provider a message by [facility] may be a faster and more efficient way to get a response.  Please allow 48 business hours for a response.  Please remember that this is for non-urgent requests.  _______________________________________________________

## 2022-02-26 DIAGNOSIS — R7989 Other specified abnormal findings of blood chemistry: Secondary | ICD-10-CM | POA: Diagnosis not present

## 2022-02-26 DIAGNOSIS — I10 Essential (primary) hypertension: Secondary | ICD-10-CM | POA: Diagnosis not present

## 2022-02-26 DIAGNOSIS — D649 Anemia, unspecified: Secondary | ICD-10-CM | POA: Diagnosis not present

## 2022-02-26 DIAGNOSIS — Z0000 Encounter for general adult medical examination without abnormal findings: Secondary | ICD-10-CM | POA: Diagnosis not present

## 2022-02-26 DIAGNOSIS — M8580 Other specified disorders of bone density and structure, unspecified site: Secondary | ICD-10-CM | POA: Diagnosis not present

## 2022-02-26 DIAGNOSIS — E785 Hyperlipidemia, unspecified: Secondary | ICD-10-CM | POA: Diagnosis not present

## 2022-02-26 DIAGNOSIS — R5383 Other fatigue: Secondary | ICD-10-CM | POA: Diagnosis not present

## 2022-02-26 DIAGNOSIS — E538 Deficiency of other specified B group vitamins: Secondary | ICD-10-CM | POA: Diagnosis not present

## 2022-02-26 DIAGNOSIS — C8590 Non-Hodgkin lymphoma, unspecified, unspecified site: Secondary | ICD-10-CM | POA: Diagnosis not present

## 2022-02-26 DIAGNOSIS — E291 Testicular hypofunction: Secondary | ICD-10-CM | POA: Diagnosis not present

## 2022-03-05 DIAGNOSIS — F4321 Adjustment disorder with depressed mood: Secondary | ICD-10-CM | POA: Diagnosis not present

## 2022-03-05 DIAGNOSIS — M5030 Other cervical disc degeneration, unspecified cervical region: Secondary | ICD-10-CM | POA: Diagnosis not present

## 2022-03-05 DIAGNOSIS — E785 Hyperlipidemia, unspecified: Secondary | ICD-10-CM | POA: Diagnosis not present

## 2022-03-05 DIAGNOSIS — R5383 Other fatigue: Secondary | ICD-10-CM | POA: Diagnosis not present

## 2022-03-05 DIAGNOSIS — C8590 Non-Hodgkin lymphoma, unspecified, unspecified site: Secondary | ICD-10-CM | POA: Diagnosis not present

## 2022-03-05 DIAGNOSIS — I2510 Atherosclerotic heart disease of native coronary artery without angina pectoris: Secondary | ICD-10-CM | POA: Diagnosis not present

## 2022-03-05 DIAGNOSIS — I10 Essential (primary) hypertension: Secondary | ICD-10-CM | POA: Diagnosis not present

## 2022-03-05 DIAGNOSIS — Z23 Encounter for immunization: Secondary | ICD-10-CM | POA: Diagnosis not present

## 2022-03-05 DIAGNOSIS — M5136 Other intervertebral disc degeneration, lumbar region: Secondary | ICD-10-CM | POA: Diagnosis not present

## 2022-03-05 DIAGNOSIS — M8580 Other specified disorders of bone density and structure, unspecified site: Secondary | ICD-10-CM | POA: Diagnosis not present

## 2022-03-05 DIAGNOSIS — Z0000 Encounter for general adult medical examination without abnormal findings: Secondary | ICD-10-CM | POA: Diagnosis not present

## 2022-03-05 DIAGNOSIS — D510 Vitamin B12 deficiency anemia due to intrinsic factor deficiency: Secondary | ICD-10-CM | POA: Diagnosis not present

## 2022-03-08 ENCOUNTER — Other Ambulatory Visit: Payer: Self-pay | Admitting: Hematology and Oncology

## 2022-03-08 ENCOUNTER — Other Ambulatory Visit: Payer: Self-pay

## 2022-03-08 ENCOUNTER — Inpatient Hospital Stay (HOSPITAL_BASED_OUTPATIENT_CLINIC_OR_DEPARTMENT_OTHER): Payer: Medicare Other | Admitting: Hematology and Oncology

## 2022-03-08 ENCOUNTER — Inpatient Hospital Stay: Payer: Medicare Other | Attending: Hematology and Oncology

## 2022-03-08 VITALS — BP 101/70 | HR 58 | Temp 98.0°F | Resp 17 | Wt 140.6 lb

## 2022-03-08 DIAGNOSIS — C8201 Follicular lymphoma grade I, lymph nodes of head, face, and neck: Secondary | ICD-10-CM | POA: Diagnosis not present

## 2022-03-08 DIAGNOSIS — Z8572 Personal history of non-Hodgkin lymphomas: Secondary | ICD-10-CM | POA: Insufficient documentation

## 2022-03-08 DIAGNOSIS — Z79899 Other long term (current) drug therapy: Secondary | ICD-10-CM | POA: Insufficient documentation

## 2022-03-08 LAB — CBC WITH DIFFERENTIAL (CANCER CENTER ONLY)
Abs Immature Granulocytes: 0.01 10*3/uL (ref 0.00–0.07)
Basophils Absolute: 0.1 10*3/uL (ref 0.0–0.1)
Basophils Relative: 1 %
Eosinophils Absolute: 0.2 10*3/uL (ref 0.0–0.5)
Eosinophils Relative: 3 %
HCT: 35.6 % — ABNORMAL LOW (ref 39.0–52.0)
Hemoglobin: 12 g/dL — ABNORMAL LOW (ref 13.0–17.0)
Immature Granulocytes: 0 %
Lymphocytes Relative: 21 %
Lymphs Abs: 1.4 10*3/uL (ref 0.7–4.0)
MCH: 31.2 pg (ref 26.0–34.0)
MCHC: 33.7 g/dL (ref 30.0–36.0)
MCV: 92.5 fL (ref 80.0–100.0)
Monocytes Absolute: 0.6 10*3/uL (ref 0.1–1.0)
Monocytes Relative: 8 %
Neutro Abs: 4.4 10*3/uL (ref 1.7–7.7)
Neutrophils Relative %: 67 %
Platelet Count: 230 10*3/uL (ref 150–400)
RBC: 3.85 MIL/uL — ABNORMAL LOW (ref 4.22–5.81)
RDW: 13 % (ref 11.5–15.5)
WBC Count: 6.5 10*3/uL (ref 4.0–10.5)
nRBC: 0 % (ref 0.0–0.2)

## 2022-03-08 LAB — CMP (CANCER CENTER ONLY)
ALT: 8 U/L (ref 0–44)
AST: 12 U/L — ABNORMAL LOW (ref 15–41)
Albumin: 4.1 g/dL (ref 3.5–5.0)
Alkaline Phosphatase: 81 U/L (ref 38–126)
Anion gap: 3 — ABNORMAL LOW (ref 5–15)
BUN: 16 mg/dL (ref 8–23)
CO2: 32 mmol/L (ref 22–32)
Calcium: 9 mg/dL (ref 8.9–10.3)
Chloride: 103 mmol/L (ref 98–111)
Creatinine: 1.04 mg/dL (ref 0.61–1.24)
GFR, Estimated: >60 mL/min (ref >60)
Glucose, Bld: 86 mg/dL (ref 70–99)
Potassium: 4.6 mmol/L (ref 3.5–5.1)
Sodium: 138 mmol/L (ref 135–145)
Total Bilirubin: 0.9 mg/dL (ref 0.3–1.2)
Total Protein: 6.4 g/dL — ABNORMAL LOW (ref 6.5–8.1)

## 2022-03-08 LAB — IRON AND IRON BINDING CAPACITY (CC-WL,HP ONLY)
Iron: 115 ug/dL (ref 45–182)
Saturation Ratios: 39 % (ref 17.9–39.5)
TIBC: 293 ug/dL (ref 250–450)
UIBC: 178 ug/dL (ref 117–376)

## 2022-03-08 LAB — FERRITIN: Ferritin: 74 ng/mL (ref 24–336)

## 2022-03-08 LAB — LACTATE DEHYDROGENASE: LDH: 135 U/L (ref 98–192)

## 2022-03-08 NOTE — Progress Notes (Signed)
[place] [phone]   [phone]  PROGRESS NOTE  Patient Care Team: Prince Solian, MD as PCP - General (Internal Medicine) [name], MD as Consulting Physician (Gastroenterology) [name], MD as Consulting Physician (Cardiology) [name], MD (Inactive) as Consulting Physician (Anesthesiology) [name], MD as Consulting Physician (Urology) [name], MD as Consulting Physician (Pain Medicine) Orson Slick, MD as Consulting Physician (Hematology and Oncology)  Hematological/Oncological History # Follicular Lymphoma June 2007: diagnosis of follicular center cell non-Hodgkin's lymphoma, 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 (initially ritux alone, but had a poor response).  02/14/2022: PET CT scan shows no evidence of residual/recurrent disease.  03/07/2021: last visit with Dr. Jana Hakim 03/08/2022: transition care to Dr. Lorenso Courier   Interval History:  Willie Garcia 76 y.o. male with medical history significant for follicular lymphoma who presents for a follow up visit. The patient's last visit was on 03/07/2021 with Dr. Jana Hakim. In the interim since the last visit he has had no major changes in his health.  On exam today Willie Garcia is accompanied by his wife.  He reports that he has been eating well though he is concerned about putting on weight.  He notes that he has been working with his primary care provider for strategies to increase weight.  He is also taking testosterone shots to try to boost his testosterone levels.  His energy is somewhat poor and he currently ranks it as a 5 out of 10.  He has not been having any difficult use with fevers, chills, sweats, nausea, vomiting or diarrhea.  He denies any bumps or lumps or concerning lymphadenopathy.  Otherwise he has been at his baseline level of health.  He has had no  changes in his medications and has no other concerning findings on exam today.  Full 10 point ROS is listed below.  The bulk of our discussion focused on clarifying the history of follicular lymphoma and discussing disease process and steps moving forward.  Patient is wife voiced their understanding and were willing and able to continue with surveillance at this time.  MEDICAL HISTORY:  Past Medical History:  Diagnosis Date   Allergic rhinitis    Anemia    Anxiety    Basal cell carcinoma of skin 2015   BPH (benign prostatic hyperplasia)    CAD (coronary artery disease)    Depression    Ganglion cyst    left thumb   GERD (gastroesophageal reflux disease)    HTN (hypertension)    Hyperlipidemia    Left inguinal hernia    Non Hodgkin's lymphoma ([place])    Osteopenia    Personal history of colonic adenomas 04/04/2010   Scoliosis    Spinal stenosis    30 degree curve in back    SURGICAL HISTORY: Past Surgical History:  Procedure Laterality Date   basal cell cancer removed left eyelid   2019   CARDIAC CATHETERIZATION  07/14/2014   Procedure: LEFT HEART CATH AND CORS/GRAFTS ANGIOGRAPHY;  Surgeon: Jettie Booze, MD;  Location: [facility] CATH LAB;  Service: Cardiovascular;;   CARDIAC CATHETERIZATION  07/14/2014   Procedure: INTRAVASCULAR PRESSURE WIRE/FFR STUDY;  Surgeon: Jettie Booze, MD;  Location: [facility] CATH LAB;  Service: Cardiovascular;;  circ   CERVICAL SPINE SURGERY     C4-C6   COLONOSCOPY  06/18/2013   negative   CORONARY ARTERY BYPASS  GRAFT  1999   DEXA     HERNIA REPAIR     LEFT HEART CATH AND CORS/GRAFTS ANGIOGRAPHY N/A 05/24/2020   Procedure: LEFT HEART CATH AND CORS/GRAFTS ANGIOGRAPHY;  Surgeon: Jettie Booze, MD;  Location: [place] CV LAB;  Service: Cardiovascular;  Laterality: N/A;   prostate U/S + Biopsy  2002   RHINOPLASTY     several epidural injections     2004-2008   UPPER GASTROINTESTINAL ENDOSCOPY      SOCIAL HISTORY: Social History    Socioeconomic History   Marital status: Married    Spouse name: [name]   Number of children: 0   Years of education: Not on file   Highest education level: Not on file  Occupational History   Occupation: Retired  Tobacco Use   Smoking status: Never   Smokeless tobacco: Never  Vaping Use   Vaping Use: Never used  Substance and Sexual Activity   Alcohol use: No    Alcohol/week: 0.0 standard drinks of alcohol   Drug use: No   Sexual activity: Not on file  Other Topics Concern   Not on file  Social History Narrative   Married, wife is a Marine scientist, no children   Retired Microbiologist, flies as a hobby   2 caffeinated beverages daily   Social Determinants of Radio broadcast assistant Strain: Not on Art therapist Insecurity: Not on file  Transportation Needs: Not on file  Physical Activity: Not on file  Stress: Not on file  Social Connections: Not on file  Intimate Partner Violence: Not on file    FAMILY HISTORY: Family History  Problem Relation Age of Onset   Osteoarthritis Mother    Hyperlipidemia Mother    Heart disease Mother    Heart attack Mother 52   Hyperlipidemia Father    Heart disease Father    Heart attack Father 26   Colonic polyp Father    Heart attack Brother 39   Diabetes Brother    Aneurysm Maternal Grandmother    Heart attack Maternal Grandmother    Aneurysm Maternal Grandfather    Heart attack Maternal Grandfather    Aneurysm Paternal Uncle    Colon cancer Neg Hx    Stomach cancer Neg Hx    Esophageal cancer Neg Hx    Rectal cancer Neg Hx     ALLERGIES:  is allergic to lipitor [atorvastatin].  MEDICATIONS:  Current Outpatient Medications  Medication Sig Dispense Refill   acetaminophen (TYLENOL) 500 MG tablet Take 500 mg by mouth every 6 (six) hours as needed (pain).     Ascorbic Acid (VITAMIN C) 1000 MG tablet Take 1,000 mg by mouth daily.     aspirin EC 81 MG tablet Take 81 mg by mouth every Monday, Tuesday, Wednesday, Thursday, and  Friday. Swallow whole.     CALCIUM-VITAMIN D PO Take 1 tablet by mouth 3 (three) times a week.      cephALEXin (KEFLEX) 500 MG capsule Take 1 capsule (500 mg total) by mouth 3 (three) times daily. (Patient not taking: Reported on 02/15/2022) 21 capsule 0   cyanocobalamin (,VITAMIN B-12,) 1000 MCG/ML injection Inject 1,000 mcg into the muscle every 30 (thirty) days.     dicyclomine (BENTYL) 20 MG tablet TAKE ONE TABLET EVERY 6 HOURS AS NEEDED FOR SPASMS. 120 tablet 0   ezetimibe (ZETIA) 10 MG tablet Take 10 mg by mouth 3 (three) times a week.  (Patient not taking: Reported on 02/15/2022)     fluticasone (  FLONASE) 50 MCG/ACT nasal spray Place 2 sprays into both nostrils daily as needed for allergies or rhinitis.      folic acid (FOLVITE) 1 MG tablet Take 1 mg by mouth 3 (three) times a week.     gabapentin (NEURONTIN) 300 MG capsule TAKE 1 CAPSULE AT BEDTIME. 90 capsule 0   HYDROcodone-acetaminophen (NORCO/VICODIN) 5-325 MG tablet Take 1 tablet by mouth 2 (two) times daily as needed (back pain.).  (Patient not taking: Reported on 02/15/2022)     isosorbide mononitrate (IMDUR) 30 MG 24 hr tablet TAKE (1/2) TABLET DAILY. 45 tablet 1   loratadine (CLARITIN) 10 MG tablet Take 10 mg by mouth daily as needed for allergies.     metoCLOPramide (REGLAN) 5 MG tablet Take 1 tablet (5 mg total) by mouth 3 (three) times daily before meals. 90 tablet 3   metoprolol succinate (TOPROL XL) 25 MG 24 hr tablet Take 0.5 tablets (12.5 mg total) by mouth at bedtime. 45 tablet 3   nitroGLYCERIN (NITROSTAT) 0.4 MG SL tablet Place 1 tablet (0.4 mg total) under the tongue every 5 (five) minutes as needed for chest pain. (Patient not taking: Reported on 02/15/2022) 30 tablet 3   pantoprazole (PROTONIX) 20 MG tablet TAKE 1 TABLET DAILY BEFORE BREAKFAST. (Patient not taking: Reported on 02/15/2022) 30 tablet 0   rosuvastatin (CRESTOR) 20 MG tablet Take 20 mg by mouth 2 (two) times a week.     silodosin (RAPAFLO) 8 MG CAPS capsule Take  8 mg by mouth at bedtime as needed (difficulty urinating).      testosterone cypionate (DEPOTESTOTERONE CYPIONATE) 200 MG/ML injection Inject 50 mg into the muscle once a week. AS DIRECTED (0.25 ml)     No current facility-administered medications for this visit.    REVIEW OF SYSTEMS:   Constitutional: ( - ) fevers, ( - )  chills , ( - ) night sweats Eyes: ( - ) blurriness of vision, ( - ) double vision, ( - ) watery eyes Ears, nose, mouth, throat, and face: ( - ) mucositis, ( - ) sore throat Respiratory: ( - ) cough, ( - ) dyspnea, ( - ) wheezes Cardiovascular: ( - ) palpitation, ( - ) chest discomfort, ( - ) lower extremity swelling Gastrointestinal:  ( - ) nausea, ( - ) heartburn, ( - ) change in bowel habits Skin: ( - ) abnormal skin rashes Lymphatics: ( - ) new lymphadenopathy, ( - ) easy bruising Neurological: ( - ) numbness, ( - ) tingling, ( - ) new weaknesses Behavioral/Psych: ( - ) mood change, ( - ) new changes  All other systems were reviewed with the patient and are negative.  PHYSICAL EXAMINATION:  Vitals:   03/08/22 1147  BP: 101/70  Pulse: (!) 58  Resp: 17  Temp: 98 F (36.7 C)  SpO2: 100%   Filed Weights   03/08/22 1147  Weight: 140 lb 9.6 oz (63.8 kg)    GENERAL: Well-appearing elderly Caucasian male, alert, no distress and comfortable SKIN: skin color, texture, turgor are normal, no rashes or significant lesions EYES: conjunctiva are pink and non-injected, sclera clear NECK: supple, non-tender LYMPH:  no palpable lymphadenopathy in the cervical, axillary or inguinal LUNGS: clear to auscultation and percussion with normal breathing effort HEART: regular rate & rhythm and no murmurs and no lower extremity edema Musculoskeletal: no cyanosis of digits and no clubbing  PSYCH: alert & oriented x 3, fluent speech NEURO: no focal motor/sensory deficits  LABORATORY DATA:  I have  reviewed the data as listed    Latest Ref Rng & Units 03/08/2022   11:17 AM  03/07/2021   10:48 AM 01/18/2021    9:29 AM  CBC  WBC 4.0 - 10.5 K/uL 6.5  5.7  8.1   Hemoglobin 13.0 - 17.0 g/dL 12.0  13.2  12.3   Hematocrit 39.0 - 52.0 % 35.6  39.2  36.3   Platelets 150 - 400 K/uL 230  246  201        Latest Ref Rng & Units 03/08/2022   11:17 AM 03/07/2021   10:48 AM 01/18/2021    9:29 AM  CMP  Glucose 70 - 99 mg/dL 86  90  88   BUN 8 - 23 mg/dL '16  10  12   '$ Creatinine 0.61 - 1.24 mg/dL 1.04  1.12  1.07   Sodium 135 - 145 mmol/L 138  141  139   Potassium 3.5 - 5.1 mmol/L 4.6  4.5  4.4   Chloride 98 - 111 mmol/L 103  104  103   CO2 22 - 32 mmol/L 32  29  28   Calcium 8.9 - 10.3 mg/dL 9.0  9.2  9.4   Total Protein 6.5 - 8.1 g/dL 6.4  6.6  6.5   Total Bilirubin 0.3 - 1.2 mg/dL 0.9  0.7  0.8   Alkaline Phos 38 - 126 U/L 81  89  75   AST 15 - 41 U/L '12  12  12   '$ ALT 0 - 44 U/L 8  <6  10     No results found for: "MPROTEIN" Lab Results  Component Value Date   KPAFRELGTCHN 0.64 03/23/2009   KPAFRELGTCHN 1.55 11/11/2008   KPAFRELGTCHN 1.56 05/12/2008   LAMBDASER 0.87 03/23/2009   LAMBDASER 1.21 11/11/2008   LAMBDASER 1.06 05/12/2008   KAPLAMBRATIO 0.74 03/23/2009   KAPLAMBRATIO 1.28 11/11/2008   KAPLAMBRATIO 1.47 05/12/2008    RADIOGRAPHIC STUDIES: No results found.  ASSESSMENT & PLAN Willie Garcia 76 y.o. male with medical history significant for follicular lymphoma who presents for a follow up visit.   Per Dr. [name] Prior note:   "Deshun has long had back problems and has been followed for this by Dr. [name].  On November 20, 2005, the patient had an MRI of the lumbar spine without contrast for evaluation of his chronic progressive low back and left buttock pain.  This showed confluent retroperitoneal adenopathy encasing the renal vessels and measuring up to 5.2 x 3.4 cm transversely. There was no epidural mass and the kidneys appeared unremarkable.  This had been compared with an MRI from Nov 09, 2004 where no such adenopathy was noted.     The  patient's wife, [name], is a Museum/gallery curator at [name], so she brought this to the attention of [name] and a CT Scan of the chest, abdomen and pelvis was obtained there on June 11th.  It was ready by [name], showing some small mediastinal lymph nodes, the largest being 1.4 cm, but significant adenopathy surrounding the left renal vein with anterior displacement of that vein as well as the inferior vena cava.  This measured 5.2 cm in transverse diameter and 3.8 cm in AP diameter.  The adenopathy extended inferiorly to the level of the aortic bifurcation.  There were small mesenteric lymph nodes, all less than 12 mm. The spleen was upper normal in size with no focal lesions.  There was no pelvic adenopathy. "  # Follicular Lymphoma-under surveillance --  At this time findings are consistent with a low-grade follicular lymphoma treated in 2007 and has not required treatment since. -- Per NCCN recommendations we will continue observation at this time.  No signs or symptoms concerning for recurrence at this time. -- Last PET CT scan in August 2022.  No clear indication for repeat imaging as patient were to have symptoms or concerning changes in his blood work. -- Labs today show white blood cell count 6.5, hemoglobin 12.0, MCV 92.5, and platelets of 230 -- Return to clinic in 1 years time or sooner if he would have any cytopenias or signs or symptoms concerning for recurrent disease.  Orders Placed This Encounter  Procedures   Iron and Iron Binding Capacity ([facility] only)    Standing Status:   Future    Number of Occurrences:   1    Standing Expiration Date:   03/09/2023   Ferritin    Standing Status:   Future    Number of Occurrences:   1    Standing Expiration Date:   03/09/2023    All questions were answered. The patient knows to call the clinic with any problems, questions or concerns.  A total of more than 40 minutes were spent on this encounter with face-to-face time and  non-face-to-face time, including preparing to see the patient, ordering tests and/or medications, counseling the patient and coordination of care as outlined above.   Ledell Peoples, MD Department of Hematology/Oncology [place] at [facility] Phone: [phone] Pager: [phone] Email: [email or web address]  03/08/2022 5:18 PM

## 2022-03-26 DIAGNOSIS — J302 Other seasonal allergic rhinitis: Secondary | ICD-10-CM | POA: Diagnosis not present

## 2022-03-26 DIAGNOSIS — R051 Acute cough: Secondary | ICD-10-CM | POA: Diagnosis not present

## 2022-03-26 DIAGNOSIS — J209 Acute bronchitis, unspecified: Secondary | ICD-10-CM | POA: Diagnosis not present

## 2022-04-02 DIAGNOSIS — J209 Acute bronchitis, unspecified: Secondary | ICD-10-CM | POA: Diagnosis not present

## 2022-04-02 DIAGNOSIS — R059 Cough, unspecified: Secondary | ICD-10-CM | POA: Diagnosis not present

## 2022-04-02 DIAGNOSIS — Z1152 Encounter for screening for COVID-19: Secondary | ICD-10-CM | POA: Diagnosis not present

## 2022-04-02 DIAGNOSIS — R5383 Other fatigue: Secondary | ICD-10-CM | POA: Diagnosis not present

## 2022-04-09 ENCOUNTER — Ambulatory Visit: Payer: Medicare Other | Admitting: Physician Assistant

## 2022-04-12 ENCOUNTER — Ambulatory Visit: Payer: Medicare Other | Admitting: Physician Assistant

## 2022-04-14 ENCOUNTER — Other Ambulatory Visit: Payer: Self-pay | Admitting: Internal Medicine

## 2022-04-14 DIAGNOSIS — Z23 Encounter for immunization: Secondary | ICD-10-CM | POA: Diagnosis not present

## 2022-05-26 ENCOUNTER — Other Ambulatory Visit: Payer: Self-pay | Admitting: Internal Medicine

## 2022-06-04 DIAGNOSIS — M5416 Radiculopathy, lumbar region: Secondary | ICD-10-CM | POA: Diagnosis not present

## 2022-06-29 DIAGNOSIS — Z23 Encounter for immunization: Secondary | ICD-10-CM | POA: Diagnosis not present

## 2022-07-04 DIAGNOSIS — M5412 Radiculopathy, cervical region: Secondary | ICD-10-CM | POA: Diagnosis not present

## 2022-07-04 DIAGNOSIS — M5416 Radiculopathy, lumbar region: Secondary | ICD-10-CM | POA: Diagnosis not present

## 2022-07-04 DIAGNOSIS — M47812 Spondylosis without myelopathy or radiculopathy, cervical region: Secondary | ICD-10-CM | POA: Diagnosis not present

## 2022-07-23 DIAGNOSIS — H2513 Age-related nuclear cataract, bilateral: Secondary | ICD-10-CM | POA: Diagnosis not present

## 2022-07-23 DIAGNOSIS — H524 Presbyopia: Secondary | ICD-10-CM | POA: Diagnosis not present

## 2022-08-15 DIAGNOSIS — N401 Enlarged prostate with lower urinary tract symptoms: Secondary | ICD-10-CM | POA: Diagnosis not present

## 2022-08-15 DIAGNOSIS — E291 Testicular hypofunction: Secondary | ICD-10-CM | POA: Diagnosis not present

## 2022-08-17 ENCOUNTER — Encounter: Payer: Self-pay | Admitting: Oncology

## 2022-08-17 ENCOUNTER — Telehealth: Payer: Self-pay

## 2022-08-17 ENCOUNTER — Telehealth: Payer: Self-pay | Admitting: Internal Medicine

## 2022-08-17 DIAGNOSIS — R351 Nocturia: Secondary | ICD-10-CM | POA: Diagnosis not present

## 2022-08-17 DIAGNOSIS — E291 Testicular hypofunction: Secondary | ICD-10-CM | POA: Diagnosis not present

## 2022-08-17 DIAGNOSIS — R3912 Poor urinary stream: Secondary | ICD-10-CM | POA: Diagnosis not present

## 2022-08-17 DIAGNOSIS — N401 Enlarged prostate with lower urinary tract symptoms: Secondary | ICD-10-CM | POA: Diagnosis not present

## 2022-08-17 DIAGNOSIS — N5201 Erectile dysfunction due to arterial insufficiency: Secondary | ICD-10-CM | POA: Diagnosis not present

## 2022-08-17 NOTE — Telephone Encounter (Signed)
Pt wife [name] stated that she was concerned about his Hgb being low, stated that it was 11.4 and that he recently started having black stools. Unsure how long this has been going on, not on any iron pills.  Pt was scheduled to see Alonza Bogus PA on 08/22/2022 at 1:30 pm. [name] made aware   [name] verbalized understanding with all questions answered.

## 2022-08-17 NOTE — Telephone Encounter (Signed)
Left message for pt to call back  °

## 2022-08-17 NOTE — Telephone Encounter (Signed)
Patient's wife is returning Steven's call

## 2022-08-17 NOTE — Telephone Encounter (Signed)
Patient's wife called. Patient had labs drawn at Dr. Jethro Poling office. HGB 11.3 and HCT 34.1. Per Dr. Lorenso Courier. Patient does not need to come in sooner than appointment scheduled for 02/18/23. If problems arise sooner, patient can be seen sooner.

## 2022-08-17 NOTE — Telephone Encounter (Signed)
Inbound call from patient spouse requesting to speak with a nurse in regards to patient hemoglobin and also states patient has really black stool.  Please advise.  Thank you

## 2022-08-22 ENCOUNTER — Encounter: Payer: Self-pay | Admitting: Oncology

## 2022-08-22 ENCOUNTER — Ambulatory Visit (INDEPENDENT_AMBULATORY_CARE_PROVIDER_SITE_OTHER): Payer: Medicare Other | Admitting: Gastroenterology

## 2022-08-22 ENCOUNTER — Encounter: Payer: Self-pay | Admitting: Gastroenterology

## 2022-08-22 ENCOUNTER — Other Ambulatory Visit (INDEPENDENT_AMBULATORY_CARE_PROVIDER_SITE_OTHER): Payer: Medicare Other

## 2022-08-22 VITALS — BP 118/68 | HR 61 | Ht 70.0 in | Wt 142.0 lb

## 2022-08-22 DIAGNOSIS — D509 Iron deficiency anemia, unspecified: Secondary | ICD-10-CM | POA: Diagnosis not present

## 2022-08-22 DIAGNOSIS — K921 Melena: Secondary | ICD-10-CM

## 2022-08-22 LAB — IBC + FERRITIN
Ferritin: 69.8 ng/mL (ref 22.0–322.0)
Iron: 84 ug/dL (ref 42–165)
Saturation Ratios: 27.3 % (ref 20.0–50.0)
TIBC: 308 ug/dL (ref 250.0–450.0)
Transferrin: 220 mg/dL (ref 212.0–360.0)

## 2022-08-22 LAB — B12 AND FOLATE PANEL
Folate: >23.9 ng/mL (ref >5.9)
Vitamin B-12: 446 pg/mL (ref 211–911)

## 2022-08-22 MED ORDER — NA SULFATE-K SULFATE-MG SULF 17.5-3.13-1.6 GM/177ML PO SOLN: 1.0000 | Freq: Once | ORAL | 0 refills | Status: AC

## 2022-08-22 NOTE — Progress Notes (Signed)
08/22/2022 HARDIN DAR [id number] 24-Apr-1946   HISTORY OF PRESENT ILLNESS:  This is a 77 year old male who is a patient of Dr. Celesta Aver.  He is here today with his wife who is a Marine scientist at [name].  She says that his Hgb is 11.3 grams down from baseline of 12-13 grams.  Reports occasional dark stools, but he thought maybe due to something he eats.  History of iron deficiency and requiring IV iron in the past.  Iron levels had been slowly trending down again.  Also has vitamin B12 deficiency and takes injections.  He denies any overt heartburn or reflux.  He does have a lot of belching.  He has pantoprazole 40 mg daily, but does not take it regularly.  Denies seeing any red blood in his stools.  11/06/2018 colonoscopy with 2 diminutive polyps in the sigmoid and ascending colon, external and internal hemorrhoids and otherwise normal. Pathology showed 2 diminutive adenomas. No recall was recommended due to age.   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   Past Medical History:  Diagnosis Date   Allergic rhinitis    Anemia    Anxiety    Basal cell carcinoma of skin 2015   BPH (benign prostatic hyperplasia)    CAD (coronary artery disease)    Depression    Ganglion cyst    left thumb   GERD (gastroesophageal reflux disease)    HTN (hypertension)    Hyperlipidemia    Left inguinal hernia    Non Hodgkin's lymphoma ([place])    Osteopenia    Personal history of colonic adenomas 04/04/2010   Scoliosis    Spinal stenosis    30 degree curve in back   Past Surgical History:  Procedure Laterality Date   basal cell cancer removed left eyelid   2019   CARDIAC CATHETERIZATION  07/14/2014   Procedure: LEFT HEART CATH AND CORS/GRAFTS ANGIOGRAPHY;  Surgeon: Jettie Booze, MD;  Location: [facility] CATH LAB;  Service: Cardiovascular;;   CARDIAC CATHETERIZATION  07/14/2014   Procedure: INTRAVASCULAR PRESSURE  WIRE/FFR STUDY;  Surgeon: Jettie Booze, MD;  Location: [facility] CATH LAB;  Service: Cardiovascular;;  circ   CERVICAL SPINE SURGERY     C4-C6   COLONOSCOPY  06/18/2013   negative   CORONARY ARTERY BYPASS GRAFT  1999   DEXA     HERNIA REPAIR     LEFT HEART CATH AND CORS/GRAFTS ANGIOGRAPHY N/A 05/24/2020   Procedure: LEFT HEART CATH AND CORS/GRAFTS ANGIOGRAPHY;  Surgeon: Jettie Booze, MD;  Location: [place] CV LAB;  Service: Cardiovascular;  Laterality: N/A;   prostate U/S + Biopsy  2002   RHINOPLASTY     several epidural injections     2004-2008   UPPER GASTROINTESTINAL ENDOSCOPY      reports that he has never smoked. He has never used smokeless tobacco. He reports that he does not drink alcohol and does not use drugs. family history includes Aneurysm in his maternal grandfather, maternal grandmother, and paternal uncle; Colonic polyp in his father; Diabetes in his brother; Heart attack in his maternal grandfather and maternal grandmother; Heart attack (age of onset: 47) in his brother; Heart attack (age of onset: 19) in his mother; Heart attack (age of onset: 10) in his father; Heart disease in his father and mother; Hyperlipidemia in his father and mother; Osteoarthritis in his mother. Allergies  Allergen Reactions  Lipitor [Atorvastatin]     Weakness, myalgia       Outpatient Encounter Medications as of 08/22/2022  Medication Sig   acetaminophen (TYLENOL) 500 MG tablet Take 500 mg by mouth every 6 (six) hours as needed (pain).   Ascorbic Acid (VITAMIN C) 1000 MG tablet Take 1,000 mg by mouth daily.   aspirin EC 81 MG tablet Take 81 mg by mouth every Monday, Tuesday, Wednesday, Thursday, and Friday. Swallow whole.   CALCIUM-VITAMIN D PO Take 1 tablet by mouth 3 (three) times a week.    cephALEXin (KEFLEX) 500 MG capsule Take 1 capsule (500 mg total) by mouth 3 (three) times daily. (Patient not taking: Reported on 02/15/2022)   cyanocobalamin (,VITAMIN B-12,) 1000 MCG/ML  injection Inject 1,000 mcg into the muscle every 30 (thirty) days.   dicyclomine (BENTYL) 20 MG tablet TAKE ONE TABLET EVERY 6 HOURS AS NEEDED FOR SPASMS   ezetimibe (ZETIA) 10 MG tablet Take 10 mg by mouth 3 (three) times a week.  (Patient not taking: Reported on 02/15/2022)   fluticasone (FLONASE) 50 MCG/ACT nasal spray Place 2 sprays into both nostrils daily as needed for allergies or rhinitis.    folic acid (FOLVITE) 1 MG tablet Take 1 mg by mouth 3 (three) times a week.   gabapentin (NEURONTIN) 300 MG capsule TAKE 1 CAPSULE AT BEDTIME.   HYDROcodone-acetaminophen (NORCO/VICODIN) 5-325 MG tablet Take 1 tablet by mouth 2 (two) times daily as needed (back pain.).  (Patient not taking: Reported on 02/15/2022)   isosorbide mononitrate (IMDUR) 30 MG 24 hr tablet TAKE (1/2) TABLET DAILY.   loratadine (CLARITIN) 10 MG tablet Take 10 mg by mouth daily as needed for allergies.   metoCLOPramide (REGLAN) 5 MG tablet Take 1 tablet (5 mg total) by mouth 3 (three) times daily before meals.   metoprolol succinate (TOPROL XL) 25 MG 24 hr tablet Take 0.5 tablets (12.5 mg total) by mouth at bedtime.   nitroGLYCERIN (NITROSTAT) 0.4 MG SL tablet Place 1 tablet (0.4 mg total) under the tongue every 5 (five) minutes as needed for chest pain. (Patient not taking: Reported on 02/15/2022)   pantoprazole (PROTONIX) 20 MG tablet TAKE ONE TABLET BY MOUTH ONCE DAILY BEFORE BREAKFAST   rosuvastatin (CRESTOR) 20 MG tablet Take 20 mg by mouth 2 (two) times a week.   silodosin (RAPAFLO) 8 MG CAPS capsule Take 8 mg by mouth at bedtime as needed (difficulty urinating).    testosterone cypionate (DEPOTESTOTERONE CYPIONATE) 200 MG/ML injection Inject 50 mg into the muscle once a week. AS DIRECTED (0.25 ml)   No facility-administered encounter medications on file as of 08/22/2022.     REVIEW OF SYSTEMS  : All other systems reviewed and negative except where noted in the History of Present Illness.  PHYSICAL EXAM: BP 118/68    Pulse 61   Ht '5\' 10"'$  (1.778 m)   Wt 142 lb (64.4 kg)   SpO2 97%   BMI 20.37 kg/m  General: Well developed white male in no acute distress Head: Normocephalic and atraumatic Eyes:  Sclerae anicteric, conjunctiva pink. Ears: Normal auditory acuity Lungs: Clear throughout to auscultation; no W/R/R. Heart: Regular rate and rhythm; no M/R/G. Abdomen: Soft, non-distended.  BS present.  Non-tender. Rectal:  Will be done at the time of colonoscopy. Musculoskeletal: Symmetrical with no gross deformities  Skin: No lesions on visible extremities Extremities: No edema  Neurological: Alert oriented x 4, grossly non-focal Psychological:  Alert and cooperative. Normal mood and affect  ASSESSMENT AND PLAN: *Anemia  and black stool:  Hgb 11.3 grams down from baseline of 12-13 grams.  Reports occasional dark stools, but he thought maybe due to something he eats.  History of iron deficiency and requiring IV iron in the past.  Iron levels had been slowly trending down again.  Last check in 02/2022.  Last colonoscopy in May 2020, but last EGD January 2015.  Will plan for both EGD and colonoscopy with Dr. [name].  Will check iron studies, B12, folate, celiac labs today.  **The risks, benefits, and alternatives to EGD and colonoscopy were discussed with the patient and he consents to proceed.   CC:  Prince Solian, MD

## 2022-08-22 NOTE — Patient Instructions (Signed)
Your provider has requested that you go to the basement level for lab work before leaving today. Press "B" on the elevator. The lab is located at the first door on the left as you exit the elevator.  You have been scheduled for an endoscopy and colonoscopy. Please follow the written instructions given to you at your visit today. Please pick up your prep supplies at the pharmacy within the next 1-3 days. If you use inhalers (even only as needed), please bring them with you on the day of your procedure.  _______________________________________________________  If your blood pressure at your visit was 140/90 or greater, please contact your primary care physician to follow up on this.  _______________________________________________________  If you are age 17 or older, your body mass index should be between 23-30. Your Body mass index is 20.37 kg/m. If this is out of the aforementioned range listed, please consider follow up with your Primary Care Provider.  If you are age 37 or younger, your body mass index should be between 19-25. Your Body mass index is 20.37 kg/m. If this is out of the aformentioned range listed, please consider follow up with your Primary Care Provider.   ________________________________________________________  The [facility] providers would like to encourage you to use [facility] to communicate with providers for non-urgent requests or questions.  Due to long hold times on the telephone, sending your provider a message by [facility] may be a faster and more efficient way to get a response.  Please allow 48 business hours for a response.  Please remember that this is for non-urgent requests.  _______________________________________________________

## 2022-08-23 ENCOUNTER — Encounter: Payer: Self-pay | Admitting: Gastroenterology

## 2022-08-23 DIAGNOSIS — K921 Melena: Secondary | ICD-10-CM | POA: Insufficient documentation

## 2022-08-23 LAB — IGA: Immunoglobulin A: 135 mg/dL (ref 70–320)

## 2022-08-23 LAB — TISSUE TRANSGLUTAMINASE ABS,IGG,IGA
(tTG) Ab, IgA: <1 U/mL
(tTG) Ab, IgG: 3.8 U/mL

## 2022-08-30 NOTE — Addendum Note (Signed)
Addended by: [name] on: 08/30/2022 09:36 PM   Modules accepted: Level of Service

## 2022-09-04 DIAGNOSIS — M5416 Radiculopathy, lumbar region: Secondary | ICD-10-CM | POA: Diagnosis not present

## 2022-09-18 ENCOUNTER — Ambulatory Visit: Payer: Medicare Other | Admitting: Internal Medicine

## 2022-09-19 ENCOUNTER — Encounter: Payer: Self-pay | Admitting: Internal Medicine

## 2022-09-19 ENCOUNTER — Ambulatory Visit (AMBULATORY_SURGERY_CENTER): Payer: Medicare Other | Admitting: Internal Medicine

## 2022-09-19 VITALS — BP 107/69 | HR 65 | Temp 98.0°F | Resp 13 | Ht 70.0 in | Wt 142.0 lb

## 2022-09-19 DIAGNOSIS — D509 Iron deficiency anemia, unspecified: Secondary | ICD-10-CM | POA: Diagnosis not present

## 2022-09-19 DIAGNOSIS — D122 Benign neoplasm of ascending colon: Secondary | ICD-10-CM

## 2022-09-19 DIAGNOSIS — D123 Benign neoplasm of transverse colon: Secondary | ICD-10-CM

## 2022-09-19 DIAGNOSIS — D124 Benign neoplasm of descending colon: Secondary | ICD-10-CM

## 2022-09-19 DIAGNOSIS — K921 Melena: Secondary | ICD-10-CM

## 2022-09-19 DIAGNOSIS — K635 Polyp of colon: Secondary | ICD-10-CM | POA: Diagnosis not present

## 2022-09-19 MED ORDER — SODIUM CHLORIDE 0.9 % IV SOLN: 500.0000 mL | Freq: Once | INTRAVENOUS | Status: DC

## 2022-09-19 NOTE — Progress Notes (Signed)
A and O x3. Report to RN. Tolerated MAC anesthesia well.Teeth unchanged after procedure. 

## 2022-09-19 NOTE — Progress Notes (Signed)
Called to room to assist during endoscopic procedure.  Patient ID and intended procedure confirmed with present staff. Received instructions for my participation in the procedure from the performing physician.  

## 2022-09-19 NOTE — Progress Notes (Signed)
History and Physical Interval Note:  09/19/2022 11:19 AM  Willie Garcia  has presented today for endoscopic procedure(s), with the diagnosis of  Encounter Diagnoses  Name Primary?   Iron deficiency anemia, unspecified iron deficiency anemia type Yes   Black stool   .  The various methods of evaluation and treatment have been discussed with the patient and/or family. After consideration of risks, benefits and other options for treatment, the patient has consented to  the endoscopic procedure(s).   The patient's history has been reviewed, patient examined, no change in status, stable for endoscopic procedure(s).  I have reviewed the patient's chart and labs.  Questions were answered to the patient's satisfaction.     [name], MD, [facility]

## 2022-09-19 NOTE — Patient Instructions (Addendum)
I removed 4 tiny colon polyps. You have diverticulosis and hemorrhoids. Upper endoscopy is fine.  Your iron levels are ok and so are B12 and folate levels. Not sure why you have the mild anemia but not leaking blood.  I appreciate the opportunity to care for you. [name], MD, [facility]   - Resume previous diet. - Continue present medications. YOU HAD AN ENDOSCOPIC PROCEDURE TODAY AT [facility]:   Refer to the procedure report that was given to you for any specific questions about what was found during the examination.  If the procedure report does not answer your questions, please call your gastroenterologist to clarify.  If you requested that your care partner not be given the details of your procedure findings, then the procedure report has been included in a sealed envelope for you to review at your convenience later.  YOU SHOULD EXPECT: Some feelings of bloating in the abdomen. Passage of more gas than usual.  Walking can help get rid of the air that was put into your GI tract during the procedure and reduce the bloating. If you had a lower endoscopy (such as a colonoscopy or flexible sigmoidoscopy) you may notice spotting of blood in your stool or on the toilet paper. If you underwent a bowel prep for your procedure, you may not have a normal bowel movement for a few days.  Please Note:  You might notice some irritation and congestion in your nose or some drainage.  This is from the oxygen used during your procedure.  There is no need for concern and it should clear up in a day or so.  SYMPTOMS TO REPORT IMMEDIATELY:  Following lower endoscopy (colonoscopy or flexible sigmoidoscopy):  Excessive amounts of blood in the stool  Significant tenderness or worsening of abdominal pains  Swelling of the abdomen that is new, acute  Fever of 100F or higher  Following upper endoscopy (EGD)  Vomiting of blood or coffee ground material  New chest pain or pain under the  shoulder blades  Painful or persistently difficult swallowing  New shortness of breath  Fever of 100F or higher  Black, tarry-looking stools  For urgent or emergent issues, a gastroenterologist can be reached at any hour by calling [phone]. Do not use MyChart messaging for urgent concerns.    DIET:  We do recommend a small meal at first, but then you may proceed to your regular diet.  Drink plenty of fluids but you should avoid alcoholic beverages for 24 hours.  ACTIVITY:  You should plan to take it easy for the rest of today and you should NOT DRIVE or use heavy machinery until tomorrow (because of the sedation medicines used during the test).    FOLLOW UP: Our staff will call the number listed on your records the next business day following your procedure.  We will call around 7:15- 8:00 am to check on you and address any questions or concerns that you may have regarding the information given to you following your procedure. If we do not reach you, we will leave a message.     If any biopsies were taken you will be contacted by phone or by letter within the next 1-3 weeks.  Please call us at [phone] if you have not heard about the biopsies in 3 weeks.    SIGNATURES/CONFIDENTIALITY: You and/or your care partner have signed paperwork which will be entered into your electronic medical record.  These signatures attest to the  fact that that the information above on your After Visit Summary has been reviewed and is understood.  Full responsibility of the confidentiality of this discharge information lies with you and/or your care-partner.- Await pathology results.

## 2022-09-19 NOTE — Op Note (Signed)
[place] Patient Name: Willie Garcia Procedure Date: 09/19/2022 11:29 AM MRN: [id number] Endoscopist: [name] , MD, [id number] Age: 77 Referring MD:  Date of Birth: Jan 06, 1946 Gender: Male Account #: [id number] Procedure:                Colonoscopy Indications:              Iron deficiency anemia Medicines:                Monitored Anesthesia Care Procedure:                Pre-Anesthesia Assessment:                           - Prior to the procedure, a History and Physical                            was performed, and patient medications and                            allergies were reviewed. The patient's tolerance of                            previous anesthesia was also reviewed. The risks                            and benefits of the procedure and the sedation                            options and risks were discussed with the patient.                            All questions were answered, and informed consent                            was obtained. Prior Anticoagulants: The patient has                            taken no anticoagulant or antiplatelet agents. ASA                            Grade Assessment: II - A patient with mild systemic                            disease. After reviewing the risks and benefits,                            the patient was deemed in satisfactory condition to                            undergo the procedure.                           After obtaining informed consent, the colonoscope  was passed under direct vision. Throughout the                            procedure, the patient's blood pressure, pulse, and                            oxygen saturations were monitored continuously. The                            Olympus CF-HQ190L [id number]) Colonoscope was                            introduced through the anus and advanced to the the                            terminal ileum, with identification of  the                            appendiceal orifice and IC valve. The colonoscopy                            was performed without difficulty. The patient                            tolerated the procedure well. The quality of the                            bowel preparation was good. The ileocecal valve,                            appendiceal orifice, and rectum were photographed.                            The bowel preparation used was SUPREP via split                            dose instruction. Scope In: 11:38:49 AM Scope Out: 11:54:08 AM Scope Withdrawal Time: 0 hours 13 minutes 18 seconds  Total Procedure Duration: 0 hours 15 minutes 19 seconds  Findings:                 The perianal and digital rectal examinations were                            normal. Pertinent negatives include normal prostate                            (size, shape, and consistency).                           Four sessile polyps were found in the descending                            colon, transverse colon and ascending colon. The  polyps were diminutive in size. These polyps were                            removed with a cold snare. Resection and retrieval                            were complete. Verification of patient                            identification for the specimen was done. Estimated                            blood loss was minimal.                           Multiple diverticula were found in the sigmoid                            colon.                           External and internal hemorrhoids were found.                           The exam was otherwise without abnormality on                            direct and retroflexion views. Complications:            No immediate complications. Estimated Blood Loss:     Estimated blood loss was minimal. Impression:               - Four diminutive polyps in the descending colon,                            in the transverse  colon and in the ascending colon,                            removed with a cold snare. Resected and retrieved.                           - Diverticulosis in the sigmoid colon.                           - External and internal hemorrhoids.                           - The examination was otherwise normal on direct                            and retroflexion views. Recommendation:           - Patient has a contact number available for                            emergencies. The signs and symptoms of potential  delayed complications were discussed with the                            patient. Return to normal activities tomorrow.                            Written discharge instructions were provided to the                            patient.                           - Resume previous diet.                           - Continue present medications.                           - Await pathology results.                           - No recommendation at this time regarding repeat                            colonoscopy due to age. [name], MD 09/19/2022 12:08:26 PM This report has been signed electronically.

## 2022-09-19 NOTE — Op Note (Signed)
[place] Patient Name: Willie Garcia Procedure Date: 09/19/2022 11:30 AM MRN: [id number] Endoscopist: [name] , MD, [id number] Age: 77 Referring MD:  Date of Birth: [birth year] Gender: Male Account #: [id number] Procedure:                Upper GI endoscopy Indications:              Iron deficiency anemia Medicines:                Propofol per Anesthesia Procedure:                Pre-Anesthesia Assessment:                           - Prior to the procedure, a History and Physical                            was performed, and patient medications and                            allergies were reviewed. The patient's tolerance of                            previous anesthesia was also reviewed. The risks                            and benefits of the procedure and the sedation                            options and risks were discussed with the patient.                            All questions were answered, and informed consent                            was obtained. Prior Anticoagulants: The patient has                            taken no anticoagulant or antiplatelet agents. ASA                            Grade Assessment: II - A patient with mild systemic                            disease. After reviewing the risks and benefits,                            the patient was deemed in satisfactory condition to                            undergo the procedure.                           After obtaining informed consent, the endoscope was  passed under direct vision. Throughout the                            procedure, the patient's blood pressure, pulse, and                            oxygen saturations were monitored continuously. The                            GIF HQ190 [id number] was introduced through the                            mouth, and advanced to the second part of duodenum.                            The upper GI endoscopy was  accomplished without                            difficulty. The patient tolerated the procedure                            well. Scope In: Scope Out: Findings:                 The esophagus was normal.                           The stomach was normal.                           The examined duodenum was normal.                           The gastroesophageal flap valve was visualized                            endoscopically and classified as Hill Grade II                            (fold present, opens with respiration).                           The cardia and gastric fundus were normal on                            retroflexion. Complications:            No immediate complications. Estimated Blood Loss:     Estimated blood loss: none. Impression:               - Normal esophagus.                           - Normal stomach.                           - Normal examined duodenum.                           -  Gastroesophageal flap valve classified as Hill                            Grade II (fold present, opens with respiration).                           - No specimens collected. Recommendation:           - See the other procedure note for documentation of                            additional recommendations. [name], MD 09/19/2022 12:04:06 PM This report has been signed electronically.

## 2022-09-20 ENCOUNTER — Telehealth: Payer: Self-pay

## 2022-09-20 NOTE — Telephone Encounter (Signed)
  Follow up Call-     09/19/2022   10:42 AM  Call back number  Post procedure Call Back phone  # [phone]  or wife [phone]  Permission to leave phone message Yes     Patient questions:  Do you have a fever, pain , or abdominal swelling? No. Pain Score  0 *  Have you tolerated food without any problems? Yes.    Have you been able to return to your normal activities? Yes.    Do you have any questions about your discharge instructions: Diet   No. Medications  No. Follow up visit  No.  Do you have questions or concerns about your Care? No.  Actions: * If pain score is 4 or above: No action needed, pain <4.

## 2022-09-25 ENCOUNTER — Ambulatory Visit: Payer: Medicare Other | Admitting: Gastroenterology

## 2022-09-28 ENCOUNTER — Encounter: Payer: Self-pay | Admitting: Internal Medicine

## 2022-10-02 ENCOUNTER — Other Ambulatory Visit: Payer: Self-pay | Admitting: Internal Medicine

## 2022-10-18 DIAGNOSIS — L905 Scar conditions and fibrosis of skin: Secondary | ICD-10-CM | POA: Diagnosis not present

## 2022-10-18 DIAGNOSIS — D1801 Hemangioma of skin and subcutaneous tissue: Secondary | ICD-10-CM | POA: Diagnosis not present

## 2022-10-18 DIAGNOSIS — Z85828 Personal history of other malignant neoplasm of skin: Secondary | ICD-10-CM | POA: Diagnosis not present

## 2022-10-18 DIAGNOSIS — D225 Melanocytic nevi of trunk: Secondary | ICD-10-CM | POA: Diagnosis not present

## 2022-10-18 DIAGNOSIS — L720 Epidermal cyst: Secondary | ICD-10-CM | POA: Diagnosis not present

## 2022-10-18 DIAGNOSIS — L570 Actinic keratosis: Secondary | ICD-10-CM | POA: Diagnosis not present

## 2022-10-18 DIAGNOSIS — L821 Other seborrheic keratosis: Secondary | ICD-10-CM | POA: Diagnosis not present

## 2022-10-18 DIAGNOSIS — L718 Other rosacea: Secondary | ICD-10-CM | POA: Diagnosis not present

## 2022-10-30 ENCOUNTER — Telehealth: Payer: Self-pay | Admitting: *Deleted

## 2022-10-30 NOTE — Telephone Encounter (Signed)
Wife called concerned about subtle changes she has noticed in the patient.  He isn't due to follow up until September with labs and visit with [facility].    She states that his PCP evaluated his hemoglobin and found that it had dropped to 11.4 and his weight has dropped down to 132 which would show a 10 lb weight loss.  She reports that he has worsening fatigue and decreased strength.  Recent colonoscopy showed no evidence of bleeding.  She wanted to know if he should move up his lab and visit and if any additional labs needed to be drawn to help figure out a reason for the changes.  Routed to Dr Leonides Schanz to please advise.

## 2022-10-31 ENCOUNTER — Telehealth: Payer: Self-pay | Admitting: Internal Medicine

## 2022-10-31 ENCOUNTER — Encounter: Payer: Self-pay | Admitting: Student

## 2022-10-31 ENCOUNTER — Ambulatory Visit: Payer: Medicare Other | Attending: Student | Admitting: Student

## 2022-10-31 VITALS — BP 132/84 | HR 52 | Ht 70.0 in | Wt 140.8 lb

## 2022-10-31 DIAGNOSIS — I2510 Atherosclerotic heart disease of native coronary artery without angina pectoris: Secondary | ICD-10-CM | POA: Diagnosis not present

## 2022-10-31 DIAGNOSIS — I10 Essential (primary) hypertension: Secondary | ICD-10-CM | POA: Diagnosis not present

## 2022-10-31 DIAGNOSIS — I25118 Atherosclerotic heart disease of native coronary artery with other forms of angina pectoris: Secondary | ICD-10-CM | POA: Insufficient documentation

## 2022-10-31 MED ORDER — NITROGLYCERIN 0.4 MG SL SUBL: 0.4000 mg | SUBLINGUAL_TABLET | SUBLINGUAL | 1 refills | Status: DC | PRN

## 2022-10-31 NOTE — Telephone Encounter (Signed)
Pt c/o BP issue: STAT if pt c/o blurred vision, one-sided weakness or slurred speech  1. What are your last 5 BP readings? 144/90   2. Are you having any other symptoms (ex. Dizziness, headache, blurred vision, passed out)? Nausea, Dizziness and feeling very weak.  3. What is your BP issue? Pt's spouse called concerned about his BP being high. She stated he needs to be seen by someone as soon as possible.

## 2022-10-31 NOTE — Patient Instructions (Signed)
Medication Instructions:  Your physician recommends that you continue on your current medications as directed. Please refer to the Current Medication list given to you today.  *If you need a refill on your cardiac medications before your next appointment, please call your pharmacy*   Lab Work: Your physician recommends that you have the following labs drawn today: CBC and BMET  If you have labs (blood work) drawn today and your tests are completely normal, you will receive your results only by: MyChart Message (if you have MyChart) OR A paper copy in the mail If you have any lab test that is abnormal or we need to change your treatment, we will call you to review the results.   Testing/Procedures: NONE   Follow-Up: At [facility], you and your health needs are our priority.  As part of our continuing mission to provide you with exceptional heart care, we have created designated Provider Care Teams.  These Care Teams include your primary Cardiologist (physician) and Advanced Practice Providers (APPs -  Physician Assistants and Nurse Practitioners) who all work together to provide you with the care you need, when you need it.  We recommend signing up for the patient portal called "MyChart".  Sign up information is provided on this After Visit Summary.  MyChart is used to connect with patients for Virtual Visits (Telemedicine).  Patients are able to view lab/test results, encounter notes, upcoming appointments, etc.  Non-urgent messages can be sent to your provider as well.   To learn more about what you can do with MyChart, go to [email or web address].    Your next appointment:   Please keep upcoming appointment with Dr. Ladona Ridgel as scheduled.

## 2022-10-31 NOTE — Progress Notes (Signed)
Cardiology Clinic Note   Date: 10/31/2022 ID: Willie Garcia, DOB [birth year], MRN [id number]  Primary Cardiologist:  Lewayne Bunting, MD  Patient Profile    Willie Garcia is a 77 y.o. male who presents to the clinic today for evaluation of hypertension and chest tightness.   Past medical history significant for: CAD. S/p CABG 1999.  LHC 07/14/2014 (abnormal stress test): Occluded LAD.  Patent LIMA to LAD.  Occluded SVG to diagonal.  Moderate ostial disease of LCx not significant by FFR.  SVG to distal OM widely patent.  Occluded RCA.  Patent SVG to RCA.  Continue medical therapy.  Suspect occlusion of the SVG to diagonal is chronic secondary to no increase in troponin. LHC 05/24/2020 (angina): Ostial proximal RCA 100%.  Patent SVG to RCA.  Ostial to proximal Cx 50% unchanged from 2016 (negative by FFR in 2016).  High first OM lesion 90%.  Patent RIMA to ramus.  Proximal to mid LAD 95%.  Patent LIMA to LAD.  Occluded SVG to diagonal unchanged from 2016.  Continue medical therapy and aggressive secondary prevention. Hypertension. Hyperlipidemia. Non-Hodgkin's lymphoma. In remission.   History of Present Illness    Willie Garcia is a long time patient of cardiology. He is followed by Dr. Ladona Ridgel for the above outlined history.   He was last seen in the office by Dr. Ladona Ridgel on 07/17/2021.  He was not complaining of angina and was encouraged to stop Imdur.  BP was reported to be on the low side.  No other changes were made at that time.  Patient's wife contacted the office today with concerns for hypertension and chest tightness: "Pt spouse called [facility] with urgent concern.    Pt spouse states that husband's BP last night was 172/102, 173/98, 144/91 at 100 am.  Pt BP normally runs high 90's to 100's systolic, 60-70 range diastolic.   Spouse also stated Pt c/o of intermittent chest tightness last night, with left arm tingling.  These symptoms came and went, Pt spouse is an Charity fundraiser, and gave  Pt 1/2 dose of metoprolol and symptoms improved, but Pt respirations increased, and Pt c/o of dizziness.  Spouse asked if Pt was taken to ER, and Spouse stated Pt refused to go.  Pt had intermittent symptoms this morning, but had gone away at time of telephone call.  Pt and spouse were seeking a cardiology provider assessment for an EKG, and wanted a [facility] appointment at [facility] booked, per leadership, [facility] had appointment availability with provider Carlos Levering NP, at 130 pm on 5/15.  Pt appreciated the appointment, and appointment booked.  Pt spouse advised for worsening symptoms, an ER providers assessment is still recommended.  Spouse and Pt verbalized understanding."    Today, patient is accompanied by his wife who is a Engineer, civil (consulting). Patient reports he began feeling lightheaded with mild blurred vision last night so he checked his BP and it was high. He also had associated central chest tightness with some tingling down right arm and belching without associated shortness of breath. He got his wife to check his BP with readings 172/102 and 144/91.  He also noted nausea, however this is not a new issue for him.  He took 4 baby aspirin with some relief. Patient reports intermittent chest tightness with belching throughout the day.  When asked if chest tightness is similar to previous chest pain he reports "I cannot remember."  Patient does not eat much secondary to decreased  appetite. Yesterday he had a [employer] breakfast sandwich and burger and baked potato from a restaurant. He has a history of GERD for which he does not take pantoprazole regularly.  Patient's wife gave him pantoprazole this morning and feels he is belching less than he was yesterday.  He has only eaten a small amount of applesauce today.  Patient admits he does not take any of his medications regularly.  He states he takes aspirin just about every day.  As far as all of his other medications  "I take them when I can."  Patient's wife reports he was not taking Imdur after seeing Dr. Ladona Ridgel in January of last year but 1 day he put the bottle out for her to refill it so she did.  Patient is unsure of when the last time he took it was.  He states he definitely did not take it in the last couple of days.  Patient's wife reports she feels patient's appetite and energy have been slowly getting worse.  He has had increased weight loss.  She is awaiting scheduling with Dr. Leonides Schanz hematology oncology secondary to patient's history of non-Hodgkin's lymphoma.  Patient is active working outside as much as possible.  He denies any chest tightness with outside activities but does note some fatigue.  Patient's wife reports he will sometimes come into the house stating he is nauseated and needs to eat because he has not eaten all day.  Patient also has chronic back and neck issues with periodic epidural injections.    ROS: All other systems reviewed and are otherwise negative except as noted in History of Present Illness.  Studies Reviewed    ECG personally reviewed by me today: Sinus bradycardia, 52 bpm.  No significant changes from 07/17/2021.      Physical Exam    VS:  BP 132/84   Pulse (!) 52   Ht 5\' 10"  (1.778 m)   Wt 140 lb 12.8 oz (63.9 kg)   SpO2 98%   BMI 20.20 kg/m  , BMI Body mass index is 20.2 kg/m.  GEN: Well nourished, well developed, in no acute distress. Neck: No JVD or carotid bruits. Cardiac:  RRR. No murmurs. No rubs or gallops.   Respiratory:  Respirations regular and unlabored. Clear to auscultation without rales, wheezing or rhonchi. GI: Soft, nontender, nondistended. Extremities: Radials/DP/PT 2+ and equal bilaterally. No clubbing or cyanosis. No edema.  Skin: Warm and dry, no rash. Neuro: Strength intact.  Assessment & Plan    CAD/chest tightness.  S/p CABG in 1999.  LHC January 2016 showed patent LIMA to LAD, SVG to distal OM, SVG to RCA, RIMA to ramus.  Occluded  SVG to diagonal.  LHC December 2021 was essentially unchanged from 2016.  Patient reports episode of central chest tightness with tingling down right arm and nausea (not a new complaint) that began last night with hypertension without shortness of breath.  Patient denies recent exertional chest pain.  He is unsure if this is his anginal equivalent.  Patient does not take medications regularly.  He states he takes aspirin most days but will sporadically take metoprolol, Imdur, cholesterol medications stating "I take them when I can."  He has never taken NTG.  Using shared decision making will take a conservative approach.  Patient agrees to take metoprolol, aspirin, Imdur daily.  He will take Imdur in the morning and metoprolol in the evenings.  Will refill NTG and patient agrees to take this with further episodes of chest  tightness.  Will get a CBC and BMP today.  Patient is in agreement with this plan stating "I would maybe come today if she had not made me" pointing to his wife.  Patient's wife feels comfortable with this plan.  They will contact the office if symptoms do not resolve with the resumption of daily medications.  Provided ED precautions. Hypertension. BP today 132/84 on intake and 128/70 on my recheck.  Patient is not lightheaded and denies vision changes today.  BP typically runs lower than this.  They will continue to monitor at home.  Patient will take Imdur 15 mg in the morning and Toprol 12.5 mg in the evenings.  Disposition: CBC and BMP today.  Refill NTG.  Keep previously scheduled appointment with Dr. Ladona Ridgel on 12/13/2022.  Contact office if symptoms recur.  ED precautions provided.         Signed, Etta Grandchild. Willie Rini, DNP, NP-C

## 2022-10-31 NOTE — Telephone Encounter (Signed)
Pt spouse called [facility] T[facility] with urgent concern.    Pt spouse states that husband's BP last night was 172/102, 173/98, 144/91 at 100 am.  Pt BP normally runs high 90's to 100's systolic, 60-70 range diastolic.   Spouse also stated Pt c/o of intermittent chest tightness last night, with left arm tingling.  These symptoms came and went, Pt spouse is an Charity fundraiser, and gave Pt 1/2 dose of metoprolol and symptoms improved, but Pt respirations increased, and Pt c/o of dizziness.  Spouse asked if Pt was taken to ER, and Spouse stated Pt refused to go.  Pt had intermittent symptoms this morning, but had gone away at time of telephone call.  Pt and spouse were seeking a cardiology provider assessment for an EKG, and wanted a [facility] appointment at [facility].   [facility] [facility] booked, per leadership, [facility] had appointment availability with provider Carlos Levering NP, at 130 pm on 5/15.  Pt appreciated the appointment, and appointment booked.  Pt spouse advised for worsening symptoms, an ER providers assessment is still recommended.  Spouse and Pt verbalized understanding.

## 2022-11-01 ENCOUNTER — Telehealth: Payer: Self-pay | Admitting: Internal Medicine

## 2022-11-01 LAB — CBC
Hematocrit: 38.9 % (ref 37.5–51.0)
Hemoglobin: 13.1 g/dL (ref 13.0–17.7)
MCH: 30.8 pg (ref 26.6–33.0)
MCHC: 33.7 g/dL (ref 31.5–35.7)
MCV: 91 fL (ref 79–97)
Platelets: 282 10*3/uL (ref 150–450)
RBC: 4.26 x10E6/uL (ref 4.14–5.80)
RDW: 12.8 % (ref 11.6–15.4)
WBC: 6.8 10*3/uL (ref 3.4–10.8)

## 2022-11-01 LAB — BASIC METABOLIC PANEL
BUN/Creatinine Ratio: 7 — ABNORMAL LOW (ref 10–24)
BUN: 7 mg/dL — ABNORMAL LOW (ref 8–27)
CO2: 25 mmol/L (ref 20–29)
Calcium: 9.7 mg/dL (ref 8.6–10.2)
Chloride: 101 mmol/L (ref 96–106)
Creatinine, Ser: 0.94 mg/dL (ref 0.76–1.27)
Glucose: 82 mg/dL (ref 70–99)
Potassium: 4.3 mmol/L (ref 3.5–5.2)
Sodium: 141 mmol/L (ref 134–144)
eGFR: 83 mL/min/{1.73_m2} (ref >59)

## 2022-11-01 MED ORDER — HYDROCORTISONE (PERIANAL) 2.5 % EX CREA: 1.0000 | TOPICAL_CREAM | Freq: Two times a day (BID) | CUTANEOUS | 1 refills | Status: AC | PRN

## 2022-11-01 NOTE — Telephone Encounter (Signed)
I sent rx in -   Sitz baths also  He should try some MiraLax - possibly half doses  Meds ordered this encounter  Medications   hydrocortisone (ANUSOL-HC) 2.5 % rectal cream    Sig: Place 1 Application rectally 2 (two) times daily as needed for hemorrhoids or anal itching.    Dispense:  30 g    Refill:  1

## 2022-11-01 NOTE — Telephone Encounter (Signed)
Spoke with patient's wife regarding MD recommendations. She verbalized all understanding.

## 2022-11-01 NOTE — Telephone Encounter (Signed)
Patient's wife called in stating patient has been experiencing rectal pain & swelling for the last couple of days. Wife mentions that at some point he did attempt to feel if there was anything in his rectum, and didn't know if he possibly made things worse. She did put a prep H suppository in, but it was very difficult d/t swelling & patient complained of a lot of pain. He had a small, solid bowel movement today, but says he feels that he has not had a good one since his colonoscopy on 09/19/22. Abdomen is distended & having some nausea as well. He has not tried miralax given his recent nausea. Wife would like to know if hydrocortisone cream could be called in for patient. Will route to MD for recommendations.

## 2022-11-01 NOTE — Telephone Encounter (Signed)
Patient's wife called stated the patient is having a lot of rectal pain and swelling. Seeking advise.

## 2022-11-05 DIAGNOSIS — K219 Gastro-esophageal reflux disease without esophagitis: Secondary | ICD-10-CM | POA: Diagnosis not present

## 2022-11-05 DIAGNOSIS — K602 Anal fissure, unspecified: Secondary | ICD-10-CM | POA: Diagnosis not present

## 2022-11-05 DIAGNOSIS — I10 Essential (primary) hypertension: Secondary | ICD-10-CM | POA: Diagnosis not present

## 2022-11-05 NOTE — Telephone Encounter (Signed)
Pt wife stated that pt is still having rectal pain and requesting a soon appointment. Chart reviewed. Pt was scheduled to see Mike Gip PA on 11/08/2022 at 1:30 PM. Pt made aware.  Pt verbalized understanding with all questions answered.

## 2022-11-05 NOTE — Telephone Encounter (Signed)
Inbound call from patient spouse stating patient is still experiencing rectal pain. States the medication prescribed is not working. Requesting apt for today. Advised spouse of next available f/u and she states, patient cannot wait that long and proceeded to end the call.

## 2022-11-08 ENCOUNTER — Encounter: Payer: Self-pay | Admitting: Physician Assistant

## 2022-11-08 ENCOUNTER — Ambulatory Visit (INDEPENDENT_AMBULATORY_CARE_PROVIDER_SITE_OTHER): Payer: Medicare Other | Admitting: Physician Assistant

## 2022-11-08 ENCOUNTER — Other Ambulatory Visit (INDEPENDENT_AMBULATORY_CARE_PROVIDER_SITE_OTHER): Payer: Medicare Other

## 2022-11-08 VITALS — BP 100/70 | HR 64 | Ht 70.0 in | Wt 138.0 lb

## 2022-11-08 DIAGNOSIS — K6289 Other specified diseases of anus and rectum: Secondary | ICD-10-CM

## 2022-11-08 DIAGNOSIS — R1032 Left lower quadrant pain: Secondary | ICD-10-CM

## 2022-11-08 DIAGNOSIS — R634 Abnormal weight loss: Secondary | ICD-10-CM | POA: Diagnosis not present

## 2022-11-08 DIAGNOSIS — K602 Anal fissure, unspecified: Secondary | ICD-10-CM

## 2022-11-08 DIAGNOSIS — R630 Anorexia: Secondary | ICD-10-CM

## 2022-11-08 LAB — COMPREHENSIVE METABOLIC PANEL
ALT: 6 U/L (ref 0–53)
AST: 11 U/L (ref 0–37)
Albumin: 4.1 g/dL (ref 3.5–5.2)
Alkaline Phosphatase: 78 U/L (ref 39–117)
BUN: 11 mg/dL (ref 6–23)
CO2: 30 mEq/L (ref 19–32)
Calcium: 9.3 mg/dL (ref 8.4–10.5)
Chloride: 102 mEq/L (ref 96–112)
Creatinine, Ser: 1.03 mg/dL (ref 0.40–1.50)
GFR: 70.23 mL/min (ref >60.00)
Glucose, Bld: 89 mg/dL (ref 70–99)
Potassium: 3.9 mEq/L (ref 3.5–5.1)
Sodium: 138 mEq/L (ref 135–145)
Total Bilirubin: 1 mg/dL (ref 0.2–1.2)
Total Protein: 6.6 g/dL (ref 6.0–8.3)

## 2022-11-08 LAB — SEDIMENTATION RATE: Sed Rate: 5 mm/hr (ref 0–20)

## 2022-11-08 LAB — TSH: TSH: 1 u[IU]/mL (ref 0.35–5.50)

## 2022-11-08 MED ORDER — NIFEDIPINE 0.3 % OINTMENT: 1.0000 | TOPICAL_OINTMENT | Freq: Four times a day (QID) | CUTANEOUS | 0 refills | Status: AC

## 2022-11-08 NOTE — Progress Notes (Signed)
Subjective:    Patient ID: Willie Garcia, male    DOB: [birth year], 77 y.o.   MRN: [id number]  HPI Willie Garcia is a pleasant 77 year old white male, established with Dr. Leone Payor.  He was last seen in the office in March 2024 by Doug Sou, PA-C at that time with mild normocytic anemia and intermittent complaint of dark stool.  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 After the last office visit he underwent EGD and colonoscopy per Dr. Leone Payor.  At endoscopy on 09/19/2022 found to have a normal exam, and at colonoscopy he had 4 small sessile polyps removed-1 was a sessile serrated polyp 1 was a tubular adenoma and and others were benign colonic mucosa, was noted to have multiple diverticuli and was felt to have internal/external hemorrhoids. He had called back about a week ago with complaints of rectal pain which was persisting. On further conversation today he apparently became constipated after the colonoscopy and developed some rectal swelling.  He did not have a bowel movement for about 10 days.  He says that he checked himself digitally to make sure that he did not have a lot of stool in his bowel.  Around that same time and with a lot of straining then developed rectal pain with bowel movements.  He had 1 dark stool that his wife had noticed that he had taken some Pepto-Bismol.  He also had some left-sided abdominal pain which has since improved.  He has been taking some MiraLAX off-and-on.  About 2 days ago he was able to have a good bowel movement and says that he feels better.  He was seen by his PCP because of the rectal pain and was just given a prescription for nifedipine ointment which they have not started using.  After calling here he was given a prescription for Anusol 2.5% for management of hemorrhoidal type pain.   Overall he feels that the rectal pain has started to improve over the past week. Patient's wife is concerned because his weight is down about 10 pounds total since the beginning of the year, he says he just does not feel good and usually does not have any appetite.  Denies any problems with nausea vomiting or abdominal pain, just does not feel like eating. His wife was trying to get him an appointment with oncology but he cannot be seen there until September. He has not had any recent abdominal imaging. He did have labs done on 10/31/2022 with hemoglobin back up to 13.1/hematocrit 38.9. Iron studies were checked after last office visit and were normal with ferritin of 69/iron 89/TIBC 308 and sat 27, B12 was 446.  Review of Systems Pertinent positive and negative review of systems were noted in the above HPI section.  All other review of systems was otherwise negative.   Outpatient Encounter Medications as of 11/08/2022  Medication Sig   acetaminophen (TYLENOL) 500 MG tablet Take 500 mg by mouth every 6 (six) hours as needed (pain).   Ascorbic Acid (VITAMIN C) 1000 MG tablet Take 1,000 mg by mouth daily.   aspirin EC 81 MG tablet Take 81 mg by mouth every Monday, Tuesday, Wednesday, Thursday, and Friday. Swallow whole.   CALCIUM-VITAMIN D PO Take 1 tablet by mouth 3 (three) times a week.    cyanocobalamin (,VITAMIN B-12,) 1000 MCG/ML injection Inject 1,000  mcg into the muscle every 30 (thirty) days.   ezetimibe (ZETIA) 10 MG tablet Take 10 mg by mouth 3 (three) times a week.   fluticasone (FLONASE) 50 MCG/ACT nasal spray Place 2 sprays into both nostrils daily as needed for allergies or rhinitis.    folic acid (FOLVITE) 1 MG tablet Take 1 mg by mouth 3 (three) times a week.   gabapentin (NEURONTIN) 300 MG capsule TAKE 1 CAPSULE AT BEDTIME.   isosorbide mononitrate (IMDUR) 30 MG 24 hr tablet TAKE (1/2) TABLET DAILY.   metoCLOPramide (REGLAN) 5 MG tablet Take 1 tablet (5 mg total) by mouth 3 (three)  times daily before meals.   metoprolol succinate (TOPROL-XL) 25 MG 24 hr tablet TAKE 1/2 TABLET BY MOUTH AT BEDTIME   nifedipine 0.3 % ointment Place 1 Application rectally 4 (four) times daily.   nitroGLYCERIN (NITROSTAT) 0.4 MG SL tablet Place 1 tablet (0.4 mg total) under the tongue every 5 (five) minutes as needed for chest pain.   pantoprazole (PROTONIX) 20 MG tablet TAKE ONE TABLET BY MOUTH ONCE DAILY BEFORE BREAKFAST   rosuvastatin (CRESTOR) 20 MG tablet Take 20 mg by mouth 2 (two) times a week.   silodosin (RAPAFLO) 8 MG CAPS capsule Take 8 mg by mouth at bedtime as needed (difficulty urinating).    testosterone cypionate (DEPOTESTOTERONE CYPIONATE) 200 MG/ML injection Inject 50 mg into the muscle once a week. AS DIRECTED (0.25 ml)   [DISCONTINUED] loratadine (CLARITIN) 10 MG tablet Take 10 mg by mouth daily as needed for allergies.   hydrocortisone (ANUSOL-HC) 2.5 % rectal cream Place 1 Application rectally 2 (two) times daily as needed for hemorrhoids or anal itching. (Patient not taking: Reported on 11/08/2022)   [DISCONTINUED] cephALEXin (KEFLEX) 500 MG capsule Take 1 capsule (500 mg total) by mouth 3 (three) times daily. (Patient not taking: Reported on 02/15/2022)   [DISCONTINUED] dicyclomine (BENTYL) 20 MG tablet TAKE ONE TABLET EVERY 6 HOURS AS NEEDED FOR SPASMS   [DISCONTINUED] HYDROcodone-acetaminophen (NORCO/VICODIN) 5-325 MG tablet Take 1 tablet by mouth 2 (two) times daily as needed (back pain.).  (Patient not taking: Reported on 02/15/2022)   No facility-administered encounter medications on file as of 11/08/2022.   Allergies  Allergen Reactions   Lipitor [Atorvastatin]     Weakness, myalgia    Patient Active Problem List   Diagnosis Date Noted   Black stool 08/23/2022   Intermetacarpal ligament injury, initial encounter 06/16/2019   Sebaceous cyst - perianal 11/04/2018   Pain in left hip 08/29/2018   Pain of left great toe 08/29/2018   Chronic pain 01/22/2018   Aortic  atherosclerosis (HCC) 01/21/2018   Chest pain at rest    Abnormal nuclear stress test 07/14/2014   Chest pain with high risk for cardiac etiology - Atypical Pain, but abnormal Myoview. 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   Abdominal bruit 10/09/2010   Coronary atherosclerosis 01/18/2009   DYSLIPIDEMIA 10/02/2008   Essential hypertension 10/02/2008   Social History   Socioeconomic History   Marital status: Married    Spouse name: [name]   Number of children: 0   Years of education: Not on file   Highest education level: Not on file  Occupational History   Occupation: Retired  Tobacco Use   Smoking status: Never   Smokeless tobacco: Never  Vaping  Use   Vaping Use: Never used  Substance and Sexual Activity   Alcohol use: No    Alcohol/week: 0.0 standard drinks of alcohol   Drug use: No   Sexual activity: Not on file  Other Topics Concern   Not on file  Social History Narrative   Married, wife is a Engineer, civil (consulting), no children   Retired Engineer, agricultural, flies as a hobby   2 caffeinated beverages daily   Social Determinants of Corporate investment banker Strain: Not on Ship broker Insecurity: Not on file  Transportation Needs: Not on file  Physical Activity: Not on file  Stress: Not on file  Social Connections: Not on file  Intimate Partner Violence: Not on file    Mr. Dieudonne family history includes Aneurysm in his maternal grandfather, maternal grandmother, and paternal uncle; Colonic polyp in his father; Diabetes in his brother; Heart attack in his maternal grandfather and maternal grandmother; Heart attack (age of onset: 23) in his brother; Heart attack (age of onset: 22) in his mother; Heart attack (age of onset: 70) in his father; Heart disease in his father and mother;  Hyperlipidemia in his father and mother; Osteoarthritis in his mother.      Objective:    Vitals:   11/08/22 1333  BP: 100/70  Pulse: 64    Physical Exam Well-developed well-nourished older WM  in no acute distress. Acc. by wife Height, Weight, BMI  HEENT; nontraumatic normocephalic, EOMI, PE R LA, sclera anicteric. Oropharynx;not done Neck; supple, no JVD Cardiovascular; regular rate and rhythm with S1-S2, no murmur rub or gallop Pulmonary; Clear bilaterally Abdomen; soft, nontender, nondistended, no palpable mass or hepatosplenomegaly, bowel sounds are active Rectal;no ecternal lesions noted, tender on digital exam on left- on anoscopy he has internal hemorrhoids and a shallow fissure on the left Skin; benign exam, no jaundice rash or appreciable lesions Extremities; no clubbing cyanosis or edema skin warm and dry Neuro/Psych; alert and oriented x4, grossly nonfocal mood and affect appropriate        Assessment & Plan:   #61 77 year old white male status post EGD and colonoscopy done 09/19/2022 after found to have a mild drop in his hemoglobin and complaint of intermittent dark stool. EGD was negative and at colonoscopy had 4 diminutive polyps removed and noted to have multiple diverticuli and internal/external hemorrhoids.  Path showed a tubular adenoma and a sessile serrated polyp.  Patient became constipated after the procedures, did not have a bowel movement for perhaps 10 days or more, then with straining and checking himself digitally then developed anal pain with bowel movements which has persisted. He had been called in Anusol cream when he called last week, he was also seen by his PCP who gave him nifedipine ointment which they have not started yet. On anoscopy today he does have a shallow fissure on the left he is not exquisitely tender, and also noted internal hemorrhoids.  Hemoglobin checked earlier this month back up to 13.1, iron studies and B12 all within normal  limits  #2 weight loss and anorexia/lack of appetite.  10 pound weight loss this year Etiology is not clear  #3 non-Hodgkin's lymphoma currently not having treatment on observation 4.  Coronary artery disease 5.  Hypertension 6.  BPH  Plan; advised him to take MiraLAX 17 g in 8 ounces water on a daily basis Since he was already called in nifedipine ointment we can use this for treatment of the anal fissure, needs to apply 1/2 to  1 inch inside the anus twice daily and can use this along with over-the-counter RectiCare/lidocaine.  It may take another 3 to 4 weeks for this to completely heal and that was explained to the patient and his wife. Will check a TSH,/sed rate, CRP and c-Met. Patient will be scheduled for CT of the abdomen pelvis with contrast. We discussed protein supplements at least twice daily to improve nutritional status and avoid further weight loss.  He does not particularly like note, we discussed use of lactose-free/nondairy Ensure/boost or resource. Further recommendations pending findings of above.  Rosco Harriott Oswald Hillock PA-C 11/08/2022   Cc: [name], MD

## 2022-11-08 NOTE — Patient Instructions (Addendum)
You have been scheduled for a CT scan of the abdomen and pelvis at [facility], 1st floor Radiology. You are scheduled on 11/27/2022 at 2:30pm. Please follow the written instructions below on the day of your exam:   1) Do not eat anything 4 hours prior to your test  You may take any medications as prescribed with a small amount of water, if necessary. If you take any of the following medications: METFORMIN, GLUCOPHAGE, GLUCOVANCE, AVANDAMET, RIOMET, FORTAMET, ACTOPLUS MET, JANUMET, GLUMETZA or METAGLIP, you MAY be asked to HOLD this medication 48 hours AFTER the exam.   The purpose of you drinking the oral contrast is to aid in the visualization of your intestinal tract. The contrast solution may cause some diarrhea. Depending on your individual set of symptoms, you may also receive an intravenous injection of x-ray contrast/dye. Plan on being at [facility] for 45 minutes or longer, depending on the type of exam you are having performed.   If you have any questions regarding your exam or if you need to reschedule, you may call [facility] at [phone] between the hours of 8:00 am and 5:00 pm, Monday-Friday.   We have sent the following medications to your pharmacy for you to pick up at your convenience:   Your provider has requested that you go to the basement level for lab work before leaving today. Press "B" on the elevator. The lab is located at the first door on the left as you exit the elevator.  Please purchase the following medications over the counter and take as directed: Miralax- 17g in 8oz of water daily Recticare- Apply to fissure, do twice daily for 1 month then use Nifedipine - call if issue persist a few weeks after use   Please follow up with Doctor Leone Payor as needed. _______________________________________________________  If your blood pressure at your visit was 140/90 or greater, please contact your primary care physician to follow up on  this.  _______________________________________________________  If you are age 77 or older, your body mass index should be between 23-30. Your Body mass index is 19.8 kg/m. If this is out of the aforementioned range listed, please consider follow up with your Primary Care Provider.  If you are age 19 or younger, your body mass index should be between 19-25. Your Body mass index is 19.8 kg/m. If this is out of the aformentioned range listed, please consider follow up with your Primary Care Provider.   ________________________________________________________  The [facility] providers would like to encourage you to use [facility] to communicate with providers for non-urgent requests or questions.  Due to long hold times on the telephone, sending your provider a message by [facility] may be a faster and more efficient way to get a response.  Please allow 48 business hours for a response.  Please remember that this is for non-urgent requests.  _______________________________________________________ It was a pleasure to see you today!  Thank you for trusting me with your gastrointestinal care!    11/27/2022

## 2022-11-16 ENCOUNTER — Other Ambulatory Visit: Payer: Self-pay | Admitting: Internal Medicine

## 2022-11-23 DIAGNOSIS — M47812 Spondylosis without myelopathy or radiculopathy, cervical region: Secondary | ICD-10-CM | POA: Diagnosis not present

## 2022-11-23 DIAGNOSIS — Z681 Body mass index (BMI) 19 or less, adult: Secondary | ICD-10-CM | POA: Diagnosis not present

## 2022-11-26 ENCOUNTER — Other Ambulatory Visit: Payer: Self-pay | Admitting: Neurological Surgery

## 2022-11-26 DIAGNOSIS — M47812 Spondylosis without myelopathy or radiculopathy, cervical region: Secondary | ICD-10-CM

## 2022-11-27 ENCOUNTER — Ambulatory Visit (HOSPITAL_COMMUNITY)
Admission: RE | Admit: 2022-11-27 | Discharge: 2022-11-27 | Disposition: A | Discharge status: Home / Self Care | Payer: Medicare Other | Source: Ambulatory Visit | Attending: Physician Assistant | Admitting: Physician Assistant

## 2022-11-27 ENCOUNTER — Encounter: Payer: Self-pay | Admitting: Oncology

## 2022-11-27 DIAGNOSIS — K6289 Other specified diseases of anus and rectum: Secondary | ICD-10-CM | POA: Insufficient documentation

## 2022-11-27 DIAGNOSIS — R1032 Left lower quadrant pain: Secondary | ICD-10-CM | POA: Diagnosis not present

## 2022-11-27 DIAGNOSIS — I700 Atherosclerosis of aorta: Secondary | ICD-10-CM | POA: Diagnosis not present

## 2022-11-27 DIAGNOSIS — R630 Anorexia: Secondary | ICD-10-CM | POA: Insufficient documentation

## 2022-11-27 DIAGNOSIS — K602 Anal fissure, unspecified: Secondary | ICD-10-CM | POA: Insufficient documentation

## 2022-11-27 DIAGNOSIS — K760 Fatty (change of) liver, not elsewhere classified: Secondary | ICD-10-CM | POA: Diagnosis not present

## 2022-11-27 DIAGNOSIS — R109 Unspecified abdominal pain: Secondary | ICD-10-CM | POA: Diagnosis not present

## 2022-11-27 DIAGNOSIS — R634 Abnormal weight loss: Secondary | ICD-10-CM | POA: Diagnosis not present

## 2022-11-27 MED ORDER — IOHEXOL 9 MG/ML PO SOLN
1000.0000 mL | Freq: Once | ORAL | Status: AC
  Administered 2022-11-27: 1000 mL via ORAL

## 2022-11-27 MED ORDER — IOHEXOL 300 MG/ML  SOLN
100.0000 mL | Freq: Once | INTRAMUSCULAR | Status: AC | PRN
  Administered 2022-11-27: 100 mL via INTRAVENOUS

## 2022-12-01 ENCOUNTER — Ambulatory Visit
Admission: RE | Admit: 2022-12-01 | Discharge: 2022-12-01 | Disposition: A | Discharge status: Home / Self Care | Payer: Medicare Other | Source: Ambulatory Visit | Attending: Neurological Surgery | Admitting: Neurological Surgery

## 2022-12-01 DIAGNOSIS — M47812 Spondylosis without myelopathy or radiculopathy, cervical region: Secondary | ICD-10-CM | POA: Diagnosis not present

## 2022-12-01 DIAGNOSIS — M4312 Spondylolisthesis, cervical region: Secondary | ICD-10-CM | POA: Diagnosis not present

## 2022-12-01 DIAGNOSIS — Z981 Arthrodesis status: Secondary | ICD-10-CM | POA: Diagnosis not present

## 2022-12-07 ENCOUNTER — Telehealth: Payer: Self-pay | Admitting: *Deleted

## 2022-12-07 DIAGNOSIS — M5416 Radiculopathy, lumbar region: Secondary | ICD-10-CM | POA: Diagnosis not present

## 2022-12-07 NOTE — Telephone Encounter (Signed)
Received vm from pt's wife requesting to have her husband seen sooner than September. She states he is losing weight. He currently weighs about 133lbs. She states he is very fatigued and generally does not feel well. TCT [name] and spoke with her. Advised that Dr. Leonides Schanz can see him on 12/18/22 with labs prior to clinic visit.  She voiced appreciation for call back and earlier appt  She is aware that her husband's recent scan did not show any evidence of recurrent lymphoma.  Scheduling message sent

## 2022-12-13 ENCOUNTER — Ambulatory Visit: Payer: Medicare Other | Attending: Internal Medicine | Admitting: Internal Medicine

## 2022-12-13 VITALS — BP 122/68 | HR 60 | Ht 70.0 in | Wt 137.6 lb

## 2022-12-13 DIAGNOSIS — I2510 Atherosclerotic heart disease of native coronary artery without angina pectoris: Secondary | ICD-10-CM

## 2022-12-13 NOTE — Progress Notes (Signed)
HPI [name] returns today for followup. He is a pleasant 77 yo man with a  h/o CAD s/p CABG, follicular lymphoma. He has preserved LV function. He does not have typical chest pain. He was placed on imdur and returns today for followup. He denies chest pain or sob. No syncope. No edema. He has had some increased bp but his weakness is improved.  Allergies  Allergen Reactions   Lipitor [Atorvastatin]     Weakness, myalgia      Current Outpatient Medications  Medication Sig Dispense Refill   acetaminophen (TYLENOL) 500 MG tablet Take 500 mg by mouth every 6 (six) hours as needed (pain).     Ascorbic Acid (VITAMIN C) 1000 MG tablet Take 1,000 mg by mouth daily.     aspirin EC 81 MG tablet Take 81 mg by mouth every Monday, Tuesday, Wednesday, Thursday, and Friday. Swallow whole.     CALCIUM-VITAMIN D PO Take 1 tablet by mouth 3 (three) times a week.      cyanocobalamin (,VITAMIN B-12,) 1000 MCG/ML injection Inject 1,000 mcg into the muscle every 30 (thirty) days.     ezetimibe (ZETIA) 10 MG tablet Take 10 mg by mouth 3 (three) times a week.     fluticasone (FLONASE) 50 MCG/ACT nasal spray Place 2 sprays into both nostrils daily as needed for allergies or rhinitis.      folic acid (FOLVITE) 1 MG tablet Take 1 mg by mouth 3 (three) times a week.     gabapentin (NEURONTIN) 300 MG capsule TAKE 1 CAPSULE AT BEDTIME. 90 capsule 0   hydrocortisone (ANUSOL-HC) 2.5 % rectal cream Place 1 Application rectally 2 (two) times daily as needed for hemorrhoids or anal itching. (Patient not taking: Reported on 11/08/2022) 30 g 1   isosorbide mononitrate (IMDUR) 30 MG 24 hr tablet TAKE (1/2) TABLET DAILY. 45 tablet 1   metoCLOPramide (REGLAN) 5 MG tablet Take 1 tablet (5 mg total) by mouth 3 (three) times daily before meals. 90 tablet 3   metoprolol succinate (TOPROL-XL) 25 MG 24 hr tablet TAKE 1/2 TABLET BY MOUTH AT BEDTIME. Must schedule appointment for future refills 15 tablet 0   nifedipine 0.3 %  ointment Place 1 Application rectally 4 (four) times daily. 30 g 0   nitroGLYCERIN (NITROSTAT) 0.4 MG SL tablet Place 1 tablet (0.4 mg total) under the tongue every 5 (five) minutes as needed for chest pain. 25 tablet 1   pantoprazole (PROTONIX) 20 MG tablet TAKE ONE TABLET BY MOUTH ONCE DAILY BEFORE BREAKFAST 30 tablet 5   rosuvastatin (CRESTOR) 20 MG tablet Take 20 mg by mouth 2 (two) times a week.     silodosin (RAPAFLO) 8 MG CAPS capsule Take 8 mg by mouth at bedtime as needed (difficulty urinating).      testosterone cypionate (DEPOTESTOTERONE CYPIONATE) 200 MG/ML injection Inject 50 mg into the muscle once a week. AS DIRECTED (0.25 ml)     No current facility-administered medications for this visit.     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   Past Surgical History:  Procedure Laterality Date   basal cell cancer removed left eyelid   2019   CARDIAC CATHETERIZATION  07/14/2014   Procedure: LEFT HEART CATH AND CORS/GRAFTS ANGIOGRAPHY;  Surgeon: Corky Crafts, MD;  Location: [facility] CATH LAB;  Service: Cardiovascular;;   CARDIAC CATHETERIZATION  07/14/2014   Procedure: INTRAVASCULAR PRESSURE WIRE/FFR STUDY;  Surgeon: Corky Crafts, MD;  Location: [facility] CATH LAB;  Service: Cardiovascular;;  circ   CERVICAL SPINE SURGERY     C4-C6   COLONOSCOPY  06/18/2013   negative   CORONARY ARTERY BYPASS GRAFT  1999   DEXA     HERNIA REPAIR     LEFT HEART  CATH AND CORS/GRAFTS ANGIOGRAPHY N/A 05/24/2020   Procedure: LEFT HEART CATH AND CORS/GRAFTS ANGIOGRAPHY;  Surgeon: Corky Crafts, MD;  Location: [facility] INVASIVE CV LAB;  Service: Cardiovascular;  Laterality: N/A;   prostate U/S + Biopsy  2002   RHINOPLASTY     several epidural injections     2004-2008   UPPER GASTROINTESTINAL ENDOSCOPY       Family History  Problem Relation Age of Onset   Osteoarthritis Mother    Hyperlipidemia Mother    Heart disease Mother    Heart attack Mother 62   Hyperlipidemia Father    Heart disease Father    Heart attack Father 49   Colonic polyp Father    Heart attack Brother 80   Diabetes Brother    Aneurysm Maternal Grandmother    Heart attack Maternal Grandmother    Aneurysm Maternal Grandfather    Heart attack Maternal Grandfather    Aneurysm Paternal Uncle    Colon cancer Neg Hx    Stomach cancer Neg Hx    Esophageal cancer Neg Hx    Rectal cancer Neg Hx      Social History   Socioeconomic History   Marital status: Married    Spouse name: Willie Garcia   Number of children: 0   Years of education: Not on file   Highest education level: Not on file  Occupational History   Occupation: Retired  Tobacco Use   Smoking status: Never   Smokeless tobacco: Never  Vaping Use   Vaping Use: Never used  Substance and Sexual Activity   Alcohol use: No    Alcohol/week: 0.0 standard drinks of alcohol   Drug use: No   Sexual activity: Not on file  Other Topics Concern   Not on file  Social History Narrative   Married, wife is a Engineer, civil (consulting), no children   Retired Engineer, agricultural, flies as a hobby   2 caffeinated beverages daily   Social Determinants of Corporate investment banker Strain: Not on Ship broker Insecurity: Not on file  Transportation Needs: Not on file  Physical Activity: Not on file  Stress: Not on file  Social Connections: Not on file  Intimate Partner Violence: Not on file     BP 122/68   Pulse 60   Ht 5\' 10"  (1.778 m)    Wt 137 lb 9.6 oz (62.4 kg)   SpO2 96%   BMI 19.74 kg/m   Physical Exam:  Well appearing NAD HEENT: Unremarkable Neck:  No JVD, no thyromegally Lymphatics:  No adenopathy  Back:  No CVA tenderness Lungs:  Clear with no wheezes HEART:  Regular rate rhythm, no murmurs, no rubs, no clicks Abd:  soft, positive bowel sounds, no organomegally, no rebound, no guarding Ext:  2 plus pulses, no edema, no cyanosis, no clubbing Skin:  No rashes no nodules Neuro:  CN II through XII intact, motor grossly intact   Assess/Plan: 1. CAD - he is s/p CABG. He does not have angina. He will continue aggressive medical therapy. I encouraged him to stop the imdur if he decides to use viagra or others. 2. HTN - his bp is well controlled. It tends to be low and I asked him to stop the imdur.  3. Dyslipidemia - he will continue crestor. 4. ED - if he decides to try sildenafil or others, then he would need to stop imdur at least 3 days.    Willie Gowda Darthula Desa,MD

## 2022-12-13 NOTE — Patient Instructions (Addendum)
Medication Instructions:  Your physician recommends that you continue on your current medications as directed. Please refer to the Current Medication list given to you today.  *If you need a refill on your cardiac medications before your next appointment, please call your pharmacy*   Lab Work: None ordered   Testing/Procedures: None ordered   Follow-Up: At [facility], you and your health needs are our priority.  As part of our continuing mission to provide you with exceptional heart care, we have created designated Provider Care Teams.  These Care Teams include your primary Cardiologist (physician) and Advanced Practice Providers (APPs -  Physician Assistants and Nurse Practitioners) who all work together to provide you with the care you need, when you need it.   Your next appointment:   1 year(s)  The format for your next appointment:   In Person  Provider:   Gregg Taylor, MD    Thank you for choosing [facility]!!   [phone]         

## 2022-12-15 ENCOUNTER — Other Ambulatory Visit: Payer: Self-pay | Admitting: Internal Medicine

## 2022-12-18 ENCOUNTER — Inpatient Hospital Stay: Payer: Medicare Other | Attending: Hematology and Oncology

## 2022-12-18 ENCOUNTER — Inpatient Hospital Stay (HOSPITAL_BASED_OUTPATIENT_CLINIC_OR_DEPARTMENT_OTHER): Payer: Medicare Other | Admitting: Hematology and Oncology

## 2022-12-18 ENCOUNTER — Other Ambulatory Visit: Payer: Self-pay

## 2022-12-18 ENCOUNTER — Other Ambulatory Visit: Payer: Self-pay | Admitting: Hematology and Oncology

## 2022-12-18 VITALS — BP 110/84 | HR 60 | Temp 97.7°F | Resp 13 | Wt 137.3 lb

## 2022-12-18 DIAGNOSIS — C8201 Follicular lymphoma grade I, lymph nodes of head, face, and neck: Secondary | ICD-10-CM

## 2022-12-18 DIAGNOSIS — C8290 Follicular lymphoma, unspecified, unspecified site: Secondary | ICD-10-CM | POA: Diagnosis not present

## 2022-12-18 DIAGNOSIS — Z79899 Other long term (current) drug therapy: Secondary | ICD-10-CM | POA: Insufficient documentation

## 2022-12-18 LAB — CBC WITH DIFFERENTIAL (CANCER CENTER ONLY)
Abs Immature Granulocytes: 0.03 10*3/uL (ref 0.00–0.07)
Basophils Absolute: 0.1 10*3/uL (ref 0.0–0.1)
Basophils Relative: 1 %
Eosinophils Absolute: 0.2 10*3/uL (ref 0.0–0.5)
Eosinophils Relative: 3 %
HCT: 36.1 % — ABNORMAL LOW (ref 39.0–52.0)
Hemoglobin: 12.6 g/dL — ABNORMAL LOW (ref 13.0–17.0)
Immature Granulocytes: 0 %
Lymphocytes Relative: 21 %
Lymphs Abs: 1.5 10*3/uL (ref 0.7–4.0)
MCH: 32.2 pg (ref 26.0–34.0)
MCHC: 34.9 g/dL (ref 30.0–36.0)
MCV: 92.3 fL (ref 80.0–100.0)
Monocytes Absolute: 0.6 10*3/uL (ref 0.1–1.0)
Monocytes Relative: 8 %
Neutro Abs: 4.9 10*3/uL (ref 1.7–7.7)
Neutrophils Relative %: 67 %
Platelet Count: 232 10*3/uL (ref 150–400)
RBC: 3.91 MIL/uL — ABNORMAL LOW (ref 4.22–5.81)
RDW: 12.8 % (ref 11.5–15.5)
WBC Count: 7.3 10*3/uL (ref 4.0–10.5)
nRBC: 0 % (ref 0.0–0.2)

## 2022-12-18 LAB — CMP (CANCER CENTER ONLY)
ALT: 10 U/L (ref 0–44)
AST: 11 U/L — ABNORMAL LOW (ref 15–41)
Albumin: 4 g/dL (ref 3.5–5.0)
Alkaline Phosphatase: 83 U/L (ref 38–126)
Anion gap: 5 (ref 5–15)
BUN: 19 mg/dL (ref 8–23)
CO2: 32 mmol/L (ref 22–32)
Calcium: 9.4 mg/dL (ref 8.9–10.3)
Chloride: 103 mmol/L (ref 98–111)
Creatinine: 1.03 mg/dL (ref 0.61–1.24)
GFR, Estimated: >60 mL/min (ref >60)
Glucose, Bld: 86 mg/dL (ref 70–99)
Potassium: 4 mmol/L (ref 3.5–5.1)
Sodium: 140 mmol/L (ref 135–145)
Total Bilirubin: 0.8 mg/dL (ref 0.3–1.2)
Total Protein: 6.2 g/dL — ABNORMAL LOW (ref 6.5–8.1)

## 2022-12-18 LAB — LACTATE DEHYDROGENASE: LDH: 126 U/L (ref 98–192)

## 2022-12-18 NOTE — Progress Notes (Signed)
[facility] [phone]   [phone]  PROGRESS NOTE  Patient Care Team: [name], MD as PCP - General (Internal Medicine) Marinus Maw, MD as PCP - Cardiology (Cardiology) Iva Boop, MD as Consulting Physician (Gastroenterology) Marinus Maw, MD as Consulting Physician (Cardiology) Odette Fraction, MD (Inactive) as Consulting Physician (Anesthesiology) Bjorn Pippin, MD as Consulting Physician (Urology) Renaldo Fiddler, MD as Consulting Physician (Pain Medicine) Jaci Standard, MD as Consulting Physician (Hematology and Oncology)  Hematological/Oncological History # Follicular Lymphoma June 2007: diagnosis of follicular center cell non-Hodgkin's lymphoma, 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 (initially ritux alone, but had a poor response).  02/14/2022: PET CT scan shows no evidence of residual/recurrent disease.  03/07/2021: last visit with Dr. Darnelle Catalan 03/08/2022: transition care to Dr. Leonides Schanz   Interval History:  Willie Garcia 77 y.o. male with medical history significant for follicular lymphoma who presents for a follow up visit. The patient's last visit was on 03/08/2022. In the interim since the last visit he has had no major changes in his health.  On exam today Mr. Heintzelman is accompanied by his wife.  He reports he has had a decrease in appetite and energy in the interim since her last visit.  He reports he has "no desire to eat".  He reports he eats when he wants to and until he is full.  His wife is concerned about him.  She reports that she continues to give him his testosterone shots but that he is becoming so thin that she is having difficulty finding subcutaneous tissue to inject it into.  His weight has declined per her reports, but on our findings he is 137 pounds today, down 1 pound from 11/08/2022.  She is interested in  starting him on nutritional supplements with Ensure and boost.  He is not having any other focal symptoms.  He denies any fevers, chills, sweats, nausea, vomiting or diarrhea.  Overall he appears at his baseline level of health.  Full 10 point ROS is listed below.  MEDICAL HISTORY:  Past Medical History:  Diagnosis Date   Allergic rhinitis    Anemia    Anxiety    Basal cell carcinoma of skin 2015   BPH (benign prostatic hyperplasia)    CAD (coronary artery disease)    Depression    Ganglion cyst    left thumb   GERD (gastroesophageal reflux disease)    HTN (hypertension)    Hyperlipidemia    Left inguinal hernia    Non Hodgkin's lymphoma (HCC)    Osteopenia    Personal history of colonic adenomas 04/04/2010   Scoliosis    Spinal stenosis    30 degree curve in back    SURGICAL HISTORY: Past Surgical History:  Procedure Laterality Date   basal cell cancer removed left eyelid   2019   CARDIAC CATHETERIZATION  07/14/2014   Procedure: LEFT HEART CATH AND CORS/GRAFTS ANGIOGRAPHY;  Surgeon: Corky Crafts, MD;  Location: [facility] CATH LAB;  Service: Cardiovascular;;   CARDIAC CATHETERIZATION  07/14/2014   Procedure: INTRAVASCULAR PRESSURE WIRE/FFR STUDY;  Surgeon: Corky Crafts, MD;  Location: [facility] CATH LAB;  Service: Cardiovascular;;  circ   CERVICAL SPINE SURGERY     C4-C6   COLONOSCOPY  06/18/2013   negative   CORONARY ARTERY BYPASS GRAFT  1999   DEXA     HERNIA  REPAIR     LEFT HEART CATH AND CORS/GRAFTS ANGIOGRAPHY N/A 05/24/2020   Procedure: LEFT HEART CATH AND CORS/GRAFTS ANGIOGRAPHY;  Surgeon: Corky Crafts, MD;  Location: [facility] INVASIVE CV LAB;  Service: Cardiovascular;  Laterality: N/A;   prostate U/S + Biopsy  2002   RHINOPLASTY     several epidural injections     2004-2008   UPPER GASTROINTESTINAL ENDOSCOPY      SOCIAL HISTORY: Social History   Socioeconomic History   Marital status: Married    Spouse name: [name]   Number of children: 0   Years of  education: Not on file   Highest education level: Not on file  Occupational History   Occupation: Retired  Tobacco Use   Smoking status: Never   Smokeless tobacco: Never  Vaping Use   Vaping Use: Never used  Substance and Sexual Activity   Alcohol use: No    Alcohol/week: 0.0 standard drinks of alcohol   Drug use: No   Sexual activity: Not on file  Other Topics Concern   Not on file  Social History Narrative   Married, wife is a Engineer, civil (consulting), no children   Retired Engineer, agricultural, flies as a hobby   2 caffeinated beverages daily   Social Determinants of Corporate investment banker Strain: Not on Ship broker Insecurity: Not on file  Transportation Needs: Not on file  Physical Activity: Not on file  Stress: Not on file  Social Connections: Not on file  Intimate Partner Violence: Not on file    FAMILY HISTORY: Family History  Problem Relation Age of Onset   Osteoarthritis Mother    Hyperlipidemia Mother    Heart disease Mother    Heart attack Mother 46   Hyperlipidemia Father    Heart disease Father    Heart attack Father 73   Colonic polyp Father    Heart attack Brother 39   Diabetes Brother    Aneurysm Maternal Grandmother    Heart attack Maternal Grandmother    Aneurysm Maternal Grandfather    Heart attack Maternal Grandfather    Aneurysm Paternal Uncle    Colon cancer Neg Hx    Stomach cancer Neg Hx    Esophageal cancer Neg Hx    Rectal cancer Neg Hx     ALLERGIES:  is allergic to lipitor [atorvastatin].  MEDICATIONS:  Current Outpatient Medications  Medication Sig Dispense Refill   acetaminophen (TYLENOL) 500 MG tablet Take 500 mg by mouth every 6 (six) hours as needed (pain).     Ascorbic Acid (VITAMIN C) 1000 MG tablet Take 1,000 mg by mouth daily.     aspirin EC 81 MG tablet Take 81 mg by mouth every Monday, Tuesday, Wednesday, Thursday, and Friday. Swallow whole.     CALCIUM-VITAMIN D PO Take 1 tablet by mouth 3 (three) times a week.       cyanocobalamin (,VITAMIN B-12,) 1000 MCG/ML injection Inject 1,000 mcg into the muscle every 30 (thirty) days.     ezetimibe (ZETIA) 10 MG tablet Take 10 mg by mouth 3 (three) times a week.     fluticasone (FLONASE) 50 MCG/ACT nasal spray Place 2 sprays into both nostrils daily as needed for allergies or rhinitis.      folic acid (FOLVITE) 1 MG tablet Take 1 mg by mouth 3 (three) times a week.     gabapentin (NEURONTIN) 300 MG capsule TAKE 1 CAPSULE AT BEDTIME. 90 capsule 0   hydrocortisone (ANUSOL-HC) 2.5 % rectal cream Place 1  Application rectally 2 (two) times daily as needed for hemorrhoids or anal itching. 30 g 1   isosorbide mononitrate (IMDUR) 30 MG 24 hr tablet TAKE (1/2) TABLET DAILY. 45 tablet 1   metoCLOPramide (REGLAN) 5 MG tablet Take 1 tablet (5 mg total) by mouth 3 (three) times daily before meals. 90 tablet 3   metoprolol succinate (TOPROL-XL) 25 MG 24 hr tablet TAKE 1/2 TABLET BY MOUTH AT BEDTIME 45 tablet 3   nifedipine 0.3 % ointment Place 1 Application rectally 4 (four) times daily. 30 g 0   nitroGLYCERIN (NITROSTAT) 0.4 MG SL tablet Place 1 tablet (0.4 mg total) under the tongue every 5 (five) minutes as needed for chest pain. 25 tablet 1   pantoprazole (PROTONIX) 20 MG tablet TAKE ONE TABLET BY MOUTH ONCE DAILY BEFORE BREAKFAST 30 tablet 5   rosuvastatin (CRESTOR) 20 MG tablet Take 20 mg by mouth 2 (two) times a week.     silodosin (RAPAFLO) 8 MG CAPS capsule Take 8 mg by mouth at bedtime as needed (difficulty urinating).      testosterone cypionate (DEPOTESTOTERONE CYPIONATE) 200 MG/ML injection Inject 50 mg into the muscle once a week. AS DIRECTED (0.25 ml)     No current facility-administered medications for this visit.    REVIEW OF SYSTEMS:   Constitutional: ( - ) fevers, ( - )  chills , ( - ) night sweats Eyes: ( - ) blurriness of vision, ( - ) double vision, ( - ) watery eyes Ears, nose, mouth, throat, and face: ( - ) mucositis, ( - ) sore throat Respiratory: ( - )  cough, ( - ) dyspnea, ( - ) wheezes Cardiovascular: ( - ) palpitation, ( - ) chest discomfort, ( - ) lower extremity swelling Gastrointestinal:  ( - ) nausea, ( - ) heartburn, ( - ) change in bowel habits Skin: ( - ) abnormal skin rashes Lymphatics: ( - ) new lymphadenopathy, ( - ) easy bruising Neurological: ( - ) numbness, ( - ) tingling, ( - ) new weaknesses Behavioral/Psych: ( - ) mood change, ( - ) new changes  All other systems were reviewed with the patient and are negative.  PHYSICAL EXAMINATION:  Vitals:   12/18/22 1254  BP: 110/84  Pulse: 60  Resp: 13  Temp: 97.7 F (36.5 C)  SpO2: 100%    Filed Weights   12/18/22 1254  Weight: 137 lb 4.8 oz (62.3 kg)     GENERAL: Well-appearing elderly Caucasian male, alert, no distress and comfortable SKIN: skin color, texture, turgor are normal, no rashes or significant lesions EYES: conjunctiva are pink and non-injected, sclera clear NECK: supple, non-tender LYMPH:  no palpable lymphadenopathy in the cervical, axillary or inguinal LUNGS: clear to auscultation and percussion with normal breathing effort HEART: regular rate & rhythm and no murmurs and no lower extremity edema Musculoskeletal: no cyanosis of digits and no clubbing  PSYCH: alert & oriented x 3, fluent speech NEURO: no focal motor/sensory deficits  LABORATORY DATA:  I have reviewed the data as listed    Latest Ref Rng & Units 12/18/2022   11:55 AM 10/31/2022    2:28 PM 03/08/2022   11:17 AM  CBC  WBC 4.0 - 10.5 K/uL 7.3  6.8  6.5   Hemoglobin 13.0 - 17.0 g/dL 16.1  09.6  04.5   Hematocrit 39.0 - 52.0 % 36.1  38.9  35.6   Platelets 150 - 400 K/uL 232  282  230  Latest Ref Rng & Units 12/18/2022   11:55 AM 11/08/2022    3:06 PM 10/31/2022    2:28 PM  CMP  Glucose 70 - 99 mg/dL 86  89  82   BUN 8 - 23 mg/dL 19  11  7    Creatinine 0.61 - 1.24 mg/dL 1.61  0.96  0.45   Sodium 135 - 145 mmol/L 140  138  141   Potassium 3.5 - 5.1 mmol/L 4.0  3.9  4.3    Chloride 98 - 111 mmol/L 103  102  101   CO2 22 - 32 mmol/L 32  30  25   Calcium 8.9 - 10.3 mg/dL 9.4  9.3  9.7   Total Protein 6.5 - 8.1 g/dL 6.2  6.6    Total Bilirubin 0.3 - 1.2 mg/dL 0.8  1.0    Alkaline Phos 38 - 126 U/L 83  78    AST 15 - 41 U/L 11  11    ALT 0 - 44 U/L 10  6      No results found for: "MPROTEIN" Lab Results  Component Value Date   KPAFRELGTCHN 0.64 03/23/2009   KPAFRELGTCHN 1.55 11/11/2008   KPAFRELGTCHN 1.56 05/12/2008   LAMBDASER 0.87 03/23/2009   LAMBDASER 1.21 11/11/2008   LAMBDASER 1.06 05/12/2008   KAPLAMBRATIO 0.74 03/23/2009   KAPLAMBRATIO 1.28 11/11/2008   KAPLAMBRATIO 1.47 05/12/2008    RADIOGRAPHIC STUDIES: MR CERVICAL SPINE WO CONTRAST  Result Date: 12/09/2022 CLINICAL DATA:  Provided history: Osteoarthritis of cervical spine, unspecified spinal osteoarthritis complication status. Additional history provided by the scanning technologist: The patient reports neck pain radiating into right shoulder/arm for 4-6 months. History of lymphoma. EXAM: MRI CERVICAL SPINE WITHOUT CONTRAST TECHNIQUE: Multiplanar, multisequence MR imaging of the cervical spine was performed. No intravenous contrast was administered. COMPARISON:  Cervical spine CT 05/15/2020. FINDINGS: Alignment: 2 mm grade 1 retrolisthesis at C2-C3 and C3-C4. Vertebrae: Susceptibility artifact arising from ACDF hardware at the C4-C6 levels. Mild marrow edema within the posterior elements on the right at C3-C4, likely degenerative and related to facet arthrosis (series 3, image 5). Cord: No signal abnormality identified within the cervical spinal cord. Mild spinal cord flattening at C3-C4 as described below. Posterior Fossa, vertebral arteries, paraspinal tissues: No abnormality identified within included portions of the posterior fossa. Flow voids preserved within the imaged cervical vertebral arteries. No paraspinal mass or collection. Disc levels: Disc degeneration at the non-operative levels,  greatest at C3-C4 (moderate at this level). C2-C3: Grade 1 retrolisthesis. Shallow disc bulge. Bilateral uncovertebral hypertrophy. Facet arthrosis (greater on the right). No significant spinal canal stenosis. Mild bilateral neural foraminal narrowing. C3-C4: Grade 1 retrolisthesis. Disc bulge with bilateral uncovertebral hypertrophy. Facet arthrosis (greater on the right). Ligamentum flavum thickening. Moderate spinal canal stenosis with effacement of the ventral and dorsal thecal sac, and mild spinal cord flattening. Bilateral neural foraminal narrowing (severe right, moderate left). C4-C5: Prior ACDF. Small endplate osteophytes. Facet spurring (greater on the right). No significant spinal canal stenosis. Mild bilateral neural foraminal narrowing. C5-C6: Prior ACDF. Endplate osteophytes. Facet hypertrophy. Ligamentum flavum thickening. Mild spinal canal stenosis. Mild/moderate bilateral neural foraminal narrowing. C6-C7: Small central disc protrusion. Facet hypertrophy. Ligamentum flavum thickening. Mild spinal canal narrowing. Mild left neural foraminal narrowing. C7-T1: No significant disc herniation or stenosis. IMPRESSION: 1. Prior C4-C6 ACDF. 2. Cervical spondylosis as outlined and with findings most notably as follows. 3. At C3-C4, there is grade 1 retrolisthesis. Moderate disc degeneration. Multifactorial moderate spinal canal stenosis with effacement of  the ventral and dorsal thecal sac, and mild spinal cord flattening. Bilateral neural foraminal narrowing (severe right, moderate left). 4. No more than mild spinal canal narrowing at the remaining levels. 5. Additional sites of foraminal stenosis, as detailed and greatest bilaterally at C5-C6 (mild/moderate at this level). 6. Mild marrow edema within the posterior elements on the right at C3-C4, likely degenerative and related to facet arthrosis. Electronically Signed   By: Jackey Loge D.O.   On: 12/09/2022 15:46   CT Abdomen Pelvis W Contrast  Result  Date: 11/30/2022 CLINICAL DATA:  Left upper quadrant and left lower quadrant pain. Unintentional weight loss. Anorexia. Personal history of non-Hodgkin lymphoma. * Tracking Code: BO * EXAM: CT ABDOMEN AND PELVIS WITH CONTRAST TECHNIQUE: Multidetector CT imaging of the abdomen and pelvis was performed using the standard protocol following bolus administration of intravenous contrast. RADIATION DOSE REDUCTION: This exam was performed according to the departmental dose-optimization program which includes automated exposure control, adjustment of the mA and/or kV according to patient size and/or use of iterative reconstruction technique. CONTRAST:  OMNIPAQUE IOHEXOL 300 MG/ML  SOLN COMPARISON:  PET-CT on 02/14/2021 FINDINGS: Lower Chest: No acute findings. Hepatobiliary: No hepatic masses identified. Mild diffuse hepatic steatosis noted. Gallbladder is unremarkable. No evidence of biliary ductal dilatation. Pancreas:  No mass or inflammatory changes. Spleen: Within normal limits in size and appearance. Adrenals/Urinary Tract: No suspicious masses identified. No evidence of ureteral calculi or hydronephrosis. Stable mild diffuse bladder wall thickening, presumably due to chronic bladder outlet obstruction given enlarged prostate. Stomach/Bowel: No evidence of obstruction, inflammatory process or abnormal fluid collections. Vascular/Lymphatic: No pathologically enlarged lymph nodes. No acute vascular findings. Aortic atherosclerotic calcification incidentally noted. Reproductive:  Stable mildly enlarged prostate. Other:  None. Musculoskeletal: No suspicious bone lesions identified. Stable lumbar spine degenerative changes and dextroscoliosis IMPRESSION: No acute findings. No evidence of recurrent lymphoma. Stable mildly enlarged prostate and findings of chronic bladder outlet obstruction. Mild hepatic steatosis. Aortic Atherosclerosis (ICD10-I70.0). Electronically Signed   By: Danae Orleans M.D.   On: 11/30/2022  17:21    ASSESSMENT & PLAN STERLYN ROMO 77 y.o. male with medical history significant for follicular lymphoma who presents for a follow up visit.   Per Dr. Darrall Dears Prior note:   "Willie Garcia has long had back problems and has been followed for this by Dr. Danielle Dess.  On November 20, 2005, the patient had an MRI of the lumbar spine without contrast for evaluation of his chronic progressive low back and left buttock pain.  This showed confluent retroperitoneal adenopathy encasing the renal vessels and measuring up to 5.2 x 3.4 cm transversely. There was no epidural mass and the kidneys appeared unremarkable.  This had been compared with an MRI from Nov 09, 2004 where no such adenopathy was noted.     The patient's wife, [name], is a Customer service manager at [employer], so she brought this to the attention of Dr. Earlene Plater and a CT Scan of the chest, abdomen and pelvis was obtained there on June 11th.  It was ready by Kara Pacer, showing some small mediastinal lymph nodes, the largest being 1.4 cm, but significant adenopathy surrounding the left renal vein with anterior displacement of that vein as well as the inferior vena cava.  This measured 5.2 cm in transverse diameter and 3.8 cm in AP diameter.  The adenopathy extended inferiorly to the level of the aortic bifurcation.  There were small mesenteric lymph nodes, all less than 12 mm. The spleen was upper  normal in size with no focal lesions.  There was no pelvic adenopathy. "  # Follicular Lymphoma-under surveillance -- At this time findings are consistent with a low-grade follicular lymphoma treated in 2007 and has not required treatment since. -- Per NCCN recommendations we will continue observation at this time.  No signs or symptoms concerning for recurrence at this time. -- Last PET CT scan in August 2022.  No clear indication for repeat imaging as patient were to have symptoms or concerning changes in his blood work.  --Patient underwent CT abdomen pelvis on  11/27/2022.  No evidence of residual/recurrent lymphoma.  Will order CT chest with contrast per patient request. -- Labs today show white blood cell count 7.3, hemoglobin 12.6, MCV 92.3, and platelets of 232. --No evidence that recurrent lymphoma is causing his symptoms.  Additionally no evidence of a hematological abnormality to explain his poor appetite and fatigue. -- Return to clinic in 1 years time or sooner if he would have any cytopenias or signs or symptoms concerning for recurrent disease.  Orders Placed This Encounter  Procedures   CT Chest W Contrast    Standing Status:   Future    Standing Expiration Date:   12/18/2023    Order Specific Question:   If indicated for the ordered procedure, I authorize the administration of contrast media per Radiology protocol    Answer:   Yes    Order Specific Question:   Does the patient have a contrast media/X-ray dye allergy?    Answer:   No    Order Specific Question:   Preferred imaging location?    Answer:   [facility]    All questions were answered. The patient knows to call the clinic with any problems, questions or concerns.  A total of more than 30 minutes were spent on this encounter with face-to-face time and non-face-to-face time, including preparing to see the patient, ordering tests and/or medications, counseling the patient and coordination of care as outlined above.   Ulysees Barns, MD Department of Hematology/Oncology [facility] at [facility] Phone: [phone] Pager: [phone] Email: [email or web address]  12/18/2022 4:01 PM

## 2022-12-19 ENCOUNTER — Ambulatory Visit (HOSPITAL_COMMUNITY)
Admission: RE | Admit: 2022-12-19 | Discharge: 2022-12-19 | Disposition: A | Discharge status: Home / Self Care | Payer: Medicare Other | Source: Ambulatory Visit | Attending: Hematology and Oncology | Admitting: Hematology and Oncology

## 2022-12-19 DIAGNOSIS — I700 Atherosclerosis of aorta: Secondary | ICD-10-CM | POA: Diagnosis not present

## 2022-12-19 DIAGNOSIS — C8200 Follicular lymphoma grade I, unspecified site: Secondary | ICD-10-CM | POA: Diagnosis not present

## 2022-12-19 DIAGNOSIS — C8201 Follicular lymphoma grade I, lymph nodes of head, face, and neck: Secondary | ICD-10-CM | POA: Insufficient documentation

## 2022-12-19 MED ORDER — IOHEXOL 300 MG/ML  SOLN
75.0000 mL | Freq: Once | INTRAMUSCULAR | Status: AC | PRN
  Administered 2022-12-19: 75 mL via INTRAVENOUS

## 2022-12-24 ENCOUNTER — Telehealth: Payer: Self-pay | Admitting: *Deleted

## 2022-12-24 NOTE — Telephone Encounter (Signed)
-----   Message from [name], MD sent at 12/24/2022  9:45 AM EDT ----- Please let [name] and his wife know that his CT scan did not show any evidence of recurrent lymphoma in his chest. No evidence that recurrent lymphoma is causing his symptoms.  Additionally no evidence of a hematological abnormality to explain his poor appetite and fatigue.  Recommend he continue to follow with his primary care provider to further workup the symptoms.  ----- Message ----- From: Leory Plowman, Rad Results In Sent: 12/24/2022   9:38 AM EDT To: Jaci Standard, MD

## 2022-12-24 NOTE — Telephone Encounter (Signed)
TCT patient regarding recent scan results. Spoke with pt's wife Advised that his CT scan did not show any evidence of recurrent lymphoma in his chest. No evidence that recurrent lymphoma is causing his symptoms. Additionally no evidence of a hematological abnormality to explain his poor appetite and fatigue. Dr. Leonides Schanz  recommends he continue to follow with his primary care provider to further workup the symptoms.  [name] voiced understanding and is glad that there is no recurrent lymphoma. She will get in touch with his PCP

## 2023-01-15 ENCOUNTER — Other Ambulatory Visit: Payer: Self-pay | Admitting: Internal Medicine

## 2023-01-16 ENCOUNTER — Other Ambulatory Visit: Payer: Self-pay | Admitting: Internal Medicine

## 2023-01-16 DIAGNOSIS — M47812 Spondylosis without myelopathy or radiculopathy, cervical region: Secondary | ICD-10-CM | POA: Diagnosis not present

## 2023-01-17 NOTE — Telephone Encounter (Signed)
Pt's pharmacy is requesting a refill on rosuvastatin. Would Dr. Ladona Ridgel like to refill this medication? Please address

## 2023-02-11 DIAGNOSIS — E291 Testicular hypofunction: Secondary | ICD-10-CM | POA: Diagnosis not present

## 2023-02-20 DIAGNOSIS — R351 Nocturia: Secondary | ICD-10-CM | POA: Diagnosis not present

## 2023-02-20 DIAGNOSIS — N5201 Erectile dysfunction due to arterial insufficiency: Secondary | ICD-10-CM | POA: Diagnosis not present

## 2023-02-20 DIAGNOSIS — N401 Enlarged prostate with lower urinary tract symptoms: Secondary | ICD-10-CM | POA: Diagnosis not present

## 2023-02-20 DIAGNOSIS — R3912 Poor urinary stream: Secondary | ICD-10-CM | POA: Diagnosis not present

## 2023-02-20 DIAGNOSIS — E291 Testicular hypofunction: Secondary | ICD-10-CM | POA: Diagnosis not present

## 2023-02-27 ENCOUNTER — Other Ambulatory Visit: Payer: Medicare Other

## 2023-02-27 ENCOUNTER — Telehealth: Payer: Self-pay | Admitting: Internal Medicine

## 2023-02-27 NOTE — Telephone Encounter (Signed)
Pt's wife wanting to know how long the pt can be off of isosorbide before he can take Viagra. Please advise

## 2023-02-27 NOTE — Telephone Encounter (Signed)
Patient needs to hold isosorbide at least 24 hours before taking Viagra and must not take another isosorbide dose until at least 24 hours after taking Viagra

## 2023-02-27 NOTE — Telephone Encounter (Signed)
Spoke with pts wife [name] per [place]. Advised her of Cheree Ditto, [facility] recommendations on taking viagra. [name] stated understanding.

## 2023-03-06 ENCOUNTER — Ambulatory Visit: Payer: Medicare Other | Admitting: Hematology and Oncology

## 2023-03-12 DIAGNOSIS — M5416 Radiculopathy, lumbar region: Secondary | ICD-10-CM | POA: Diagnosis not present

## 2023-03-23 DIAGNOSIS — Z23 Encounter for immunization: Secondary | ICD-10-CM | POA: Diagnosis not present

## 2023-04-02 DIAGNOSIS — I10 Essential (primary) hypertension: Secondary | ICD-10-CM | POA: Diagnosis not present

## 2023-04-02 DIAGNOSIS — M8580 Other specified disorders of bone density and structure, unspecified site: Secondary | ICD-10-CM | POA: Diagnosis not present

## 2023-04-02 DIAGNOSIS — D649 Anemia, unspecified: Secondary | ICD-10-CM | POA: Diagnosis not present

## 2023-04-02 DIAGNOSIS — E291 Testicular hypofunction: Secondary | ICD-10-CM | POA: Diagnosis not present

## 2023-04-02 DIAGNOSIS — D510 Vitamin B12 deficiency anemia due to intrinsic factor deficiency: Secondary | ICD-10-CM | POA: Diagnosis not present

## 2023-04-02 DIAGNOSIS — E785 Hyperlipidemia, unspecified: Secondary | ICD-10-CM | POA: Diagnosis not present

## 2023-04-02 DIAGNOSIS — Z1389 Encounter for screening for other disorder: Secondary | ICD-10-CM | POA: Diagnosis not present

## 2023-04-09 DIAGNOSIS — C8590 Non-Hodgkin lymphoma, unspecified, unspecified site: Secondary | ICD-10-CM | POA: Diagnosis not present

## 2023-04-09 DIAGNOSIS — Z23 Encounter for immunization: Secondary | ICD-10-CM | POA: Diagnosis not present

## 2023-04-09 DIAGNOSIS — R5383 Other fatigue: Secondary | ICD-10-CM | POA: Diagnosis not present

## 2023-04-09 DIAGNOSIS — Z1331 Encounter for screening for depression: Secondary | ICD-10-CM | POA: Diagnosis not present

## 2023-04-09 DIAGNOSIS — Z1339 Encounter for screening examination for other mental health and behavioral disorders: Secondary | ICD-10-CM | POA: Diagnosis not present

## 2023-04-09 DIAGNOSIS — K219 Gastro-esophageal reflux disease without esophagitis: Secondary | ICD-10-CM | POA: Diagnosis not present

## 2023-04-09 DIAGNOSIS — M51369 Other intervertebral disc degeneration, lumbar region without mention of lumbar back pain or lower extremity pain: Secondary | ICD-10-CM | POA: Diagnosis not present

## 2023-04-09 DIAGNOSIS — I10 Essential (primary) hypertension: Secondary | ICD-10-CM | POA: Diagnosis not present

## 2023-04-09 DIAGNOSIS — M8580 Other specified disorders of bone density and structure, unspecified site: Secondary | ICD-10-CM | POA: Diagnosis not present

## 2023-04-09 DIAGNOSIS — F4321 Adjustment disorder with depressed mood: Secondary | ICD-10-CM | POA: Diagnosis not present

## 2023-04-09 DIAGNOSIS — Z0000 Encounter for general adult medical examination without abnormal findings: Secondary | ICD-10-CM | POA: Diagnosis not present

## 2023-04-09 DIAGNOSIS — R82998 Other abnormal findings in urine: Secondary | ICD-10-CM | POA: Diagnosis not present

## 2023-04-09 DIAGNOSIS — E785 Hyperlipidemia, unspecified: Secondary | ICD-10-CM | POA: Diagnosis not present

## 2023-04-09 DIAGNOSIS — I2510 Atherosclerotic heart disease of native coronary artery without angina pectoris: Secondary | ICD-10-CM | POA: Diagnosis not present

## 2023-04-09 DIAGNOSIS — M5030 Other cervical disc degeneration, unspecified cervical region: Secondary | ICD-10-CM | POA: Diagnosis not present

## 2023-04-24 ENCOUNTER — Other Ambulatory Visit: Payer: Self-pay | Admitting: Internal Medicine

## 2023-06-24 DIAGNOSIS — M5416 Radiculopathy, lumbar region: Secondary | ICD-10-CM | POA: Diagnosis not present

## 2023-07-09 DIAGNOSIS — M47812 Spondylosis without myelopathy or radiculopathy, cervical region: Secondary | ICD-10-CM | POA: Diagnosis not present

## 2023-07-27 ENCOUNTER — Other Ambulatory Visit: Payer: Self-pay | Admitting: Physician Assistant

## 2023-08-02 DIAGNOSIS — H2513 Age-related nuclear cataract, bilateral: Secondary | ICD-10-CM | POA: Diagnosis not present

## 2023-09-13 DIAGNOSIS — E291 Testicular hypofunction: Secondary | ICD-10-CM | POA: Diagnosis not present

## 2023-09-13 DIAGNOSIS — N401 Enlarged prostate with lower urinary tract symptoms: Secondary | ICD-10-CM | POA: Diagnosis not present

## 2023-09-19 DIAGNOSIS — M79645 Pain in left finger(s): Secondary | ICD-10-CM | POA: Diagnosis not present

## 2023-09-24 DIAGNOSIS — M5416 Radiculopathy, lumbar region: Secondary | ICD-10-CM | POA: Diagnosis not present

## 2023-10-03 DIAGNOSIS — R0981 Nasal congestion: Secondary | ICD-10-CM | POA: Diagnosis not present

## 2023-10-03 DIAGNOSIS — R638 Other symptoms and signs concerning food and fluid intake: Secondary | ICD-10-CM | POA: Diagnosis not present

## 2023-10-03 DIAGNOSIS — Z1152 Encounter for screening for COVID-19: Secondary | ICD-10-CM | POA: Diagnosis not present

## 2023-10-03 DIAGNOSIS — R6883 Chills (without fever): Secondary | ICD-10-CM | POA: Diagnosis not present

## 2023-10-03 DIAGNOSIS — R52 Pain, unspecified: Secondary | ICD-10-CM | POA: Diagnosis not present

## 2023-10-03 DIAGNOSIS — R300 Dysuria: Secondary | ICD-10-CM | POA: Diagnosis not present

## 2023-10-03 DIAGNOSIS — U071 COVID-19: Secondary | ICD-10-CM | POA: Diagnosis not present

## 2023-10-03 DIAGNOSIS — H9201 Otalgia, right ear: Secondary | ICD-10-CM | POA: Diagnosis not present

## 2023-10-03 DIAGNOSIS — R058 Other specified cough: Secondary | ICD-10-CM | POA: Diagnosis not present

## 2023-10-15 DIAGNOSIS — M79645 Pain in left finger(s): Secondary | ICD-10-CM | POA: Diagnosis not present

## 2023-10-28 DIAGNOSIS — N401 Enlarged prostate with lower urinary tract symptoms: Secondary | ICD-10-CM | POA: Diagnosis not present

## 2023-10-28 DIAGNOSIS — R3915 Urgency of urination: Secondary | ICD-10-CM | POA: Diagnosis not present

## 2023-10-28 DIAGNOSIS — E291 Testicular hypofunction: Secondary | ICD-10-CM | POA: Diagnosis not present

## 2023-10-28 DIAGNOSIS — N5201 Erectile dysfunction due to arterial insufficiency: Secondary | ICD-10-CM | POA: Diagnosis not present

## 2023-10-28 DIAGNOSIS — R3912 Poor urinary stream: Secondary | ICD-10-CM | POA: Diagnosis not present

## 2023-11-25 DIAGNOSIS — L249 Irritant contact dermatitis, unspecified cause: Secondary | ICD-10-CM | POA: Diagnosis not present

## 2023-11-25 DIAGNOSIS — R21 Rash and other nonspecific skin eruption: Secondary | ICD-10-CM | POA: Diagnosis not present

## 2023-11-25 DIAGNOSIS — L299 Pruritus, unspecified: Secondary | ICD-10-CM | POA: Diagnosis not present

## 2023-11-25 DIAGNOSIS — W57XXXA Bitten or stung by nonvenomous insect and other nonvenomous arthropods, initial encounter: Secondary | ICD-10-CM | POA: Diagnosis not present

## 2023-12-16 ENCOUNTER — Inpatient Hospital Stay: Payer: Medicare Other | Attending: Hematology and Oncology

## 2023-12-16 ENCOUNTER — Inpatient Hospital Stay (HOSPITAL_BASED_OUTPATIENT_CLINIC_OR_DEPARTMENT_OTHER): Payer: Medicare Other | Admitting: Hematology and Oncology

## 2023-12-16 ENCOUNTER — Other Ambulatory Visit: Payer: Self-pay | Admitting: Hematology and Oncology

## 2023-12-16 VITALS — BP 108/62 | HR 61 | Temp 97.8°F | Resp 13 | Wt 139.8 lb

## 2023-12-16 DIAGNOSIS — F5089 Other specified eating disorder: Secondary | ICD-10-CM | POA: Diagnosis not present

## 2023-12-16 DIAGNOSIS — Z8579 Personal history of other malignant neoplasms of lymphoid, hematopoietic and related tissues: Secondary | ICD-10-CM | POA: Diagnosis present

## 2023-12-16 DIAGNOSIS — C8201 Follicular lymphoma grade I, lymph nodes of head, face, and neck: Secondary | ICD-10-CM | POA: Diagnosis not present

## 2023-12-16 DIAGNOSIS — R5383 Other fatigue: Secondary | ICD-10-CM | POA: Insufficient documentation

## 2023-12-16 LAB — LACTATE DEHYDROGENASE: LDH: 126 U/L (ref 98–192)

## 2023-12-16 LAB — CBC WITH DIFFERENTIAL (CANCER CENTER ONLY)
Abs Immature Granulocytes: 0.02 10*3/uL (ref 0.00–0.07)
Basophils Absolute: 0 10*3/uL (ref 0.0–0.1)
Basophils Relative: 1 %
Eosinophils Absolute: 0.2 10*3/uL (ref 0.0–0.5)
Eosinophils Relative: 3 %
HCT: 34.3 % — ABNORMAL LOW (ref 39.0–52.0)
Hemoglobin: 11.5 g/dL — ABNORMAL LOW (ref 13.0–17.0)
Immature Granulocytes: 0 %
Lymphocytes Relative: 23 %
Lymphs Abs: 1.4 10*3/uL (ref 0.7–4.0)
MCH: 31.3 pg (ref 26.0–34.0)
MCHC: 33.5 g/dL (ref 30.0–36.0)
MCV: 93.5 fL (ref 80.0–100.0)
Monocytes Absolute: 0.6 10*3/uL (ref 0.1–1.0)
Monocytes Relative: 10 %
Neutro Abs: 4 10*3/uL (ref 1.7–7.7)
Neutrophils Relative %: 63 %
Platelet Count: 210 10*3/uL (ref 150–400)
RBC: 3.67 MIL/uL — ABNORMAL LOW (ref 4.22–5.81)
RDW: 13.1 % (ref 11.5–15.5)
WBC Count: 6.3 10*3/uL (ref 4.0–10.5)
nRBC: 0 % (ref 0.0–0.2)

## 2023-12-16 LAB — CMP (CANCER CENTER ONLY)
ALT: 9 U/L (ref 0–44)
AST: 13 U/L — ABNORMAL LOW (ref 15–41)
Albumin: 4.2 g/dL (ref 3.5–5.0)
Alkaline Phosphatase: 83 U/L (ref 38–126)
Anion gap: 5 (ref 5–15)
BUN: 16 mg/dL (ref 8–23)
CO2: 31 mmol/L (ref 22–32)
Calcium: 9 mg/dL (ref 8.9–10.3)
Chloride: 102 mmol/L (ref 98–111)
Creatinine: 1.03 mg/dL (ref 0.61–1.24)
GFR, Estimated: >60 mL/min (ref >60)
Glucose, Bld: 109 mg/dL — ABNORMAL HIGH (ref 70–99)
Potassium: 4.2 mmol/L (ref 3.5–5.1)
Sodium: 138 mmol/L (ref 135–145)
Total Bilirubin: 0.6 mg/dL (ref 0.0–1.2)
Total Protein: 6.6 g/dL (ref 6.5–8.1)

## 2023-12-16 LAB — FERRITIN: Ferritin: 98 ng/mL (ref 24–336)

## 2023-12-16 LAB — IRON AND IRON BINDING CAPACITY (CC-WL,HP ONLY)
Iron: 54 ug/dL (ref 45–182)
Saturation Ratios: 19 % (ref 17.9–39.5)
TIBC: 286 ug/dL (ref 250–450)
UIBC: 232 ug/dL (ref 117–376)

## 2023-12-16 NOTE — Progress Notes (Unsigned)
 [facility] [phone]   [phone]  PROGRESS NOTE  Patient Care Team: Janey Santos, MD as PCP - General (Internal Medicine) Waddell Danelle ORN, MD as PCP - Cardiology (Cardiology) Avram Lupita BRAVO, MD as Consulting Physician (Gastroenterology) Waddell Danelle ORN, MD as Consulting Physician (Cardiology) Mindi Mt, MD (Inactive) as Consulting Physician (Anesthesiology) Watt Rush, MD as Consulting Physician (Urology) Darlis Deatrice RAMAN, MD as Consulting Physician (Pain Medicine) Federico Rush ONEIDA MADISON, MD as Consulting Physician (Hematology and Oncology)  Hematological/Oncological History # Follicular Lymphoma June 2007: diagnosis of follicular center cell non-Hodgkin's lymphoma, 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 (initially ritux alone, but had a poor response).  02/14/2022: PET CT scan shows no evidence of residual/recurrent disease.  03/07/2021: last visit with Dr. Layla 03/08/2022: transition care to Dr. Federico   Interval History:  Willie Garcia 78 y.o. male with medical history significant for follicular lymphoma who presents for a follow up visit. The patient's last visit was on 03/08/2022. In the interim since the last visit he has had no major changes in his health.  On exam today Mr. Eppinger is accompanied by his wife.  He reports he has had a decrease in appetite and energy in the interim since her last visit.  He reports he has no desire to eat.  He reports he eats when he wants to and until he is full.  His wife is concerned about him.  She reports that she continues to give him his testosterone  shots but that he is becoming so thin that she is having difficulty finding subcutaneous tissue to inject it into.  His weight has declined per her reports, but on our findings he is 137 pounds today, down 1 pound from 11/08/2022.  She is interested in  starting him on nutritional supplements with Ensure and boost.  He is not having any other focal symptoms.  He denies any fevers, chills, sweats, nausea, vomiting or diarrhea.  Overall he appears at his baseline level of health.  Full 10 point ROS is listed below.  MEDICAL HISTORY:  Past Medical History:  Diagnosis Date   Allergic rhinitis    Anemia    Anxiety    Basal cell carcinoma of skin 2015   BPH (benign prostatic hyperplasia)    CAD (coronary artery disease)    Depression    Ganglion cyst    left thumb   GERD (gastroesophageal reflux disease)    HTN (hypertension)    Hyperlipidemia    Left inguinal hernia    Non Hodgkin's lymphoma (HCC)    Osteopenia    Personal history of colonic adenomas 04/04/2010   Scoliosis    Spinal stenosis    30 degree curve in back    SURGICAL HISTORY: Past Surgical History:  Procedure Laterality Date   basal cell cancer removed left eyelid   2019   CARDIAC CATHETERIZATION  07/14/2014   Procedure: LEFT HEART CATH AND CORS/GRAFTS ANGIOGRAPHY;  Surgeon: Candyce RAMAN Reek, MD;  Location: [facility] CATH LAB;  Service: Cardiovascular;;   CARDIAC CATHETERIZATION  07/14/2014   Procedure: INTRAVASCULAR PRESSURE WIRE/FFR STUDY;  Surgeon: Candyce RAMAN Reek, MD;  Location: [facility] CATH LAB;  Service: Cardiovascular;;  circ   CERVICAL SPINE SURGERY     C4-C6   COLONOSCOPY  06/18/2013   negative   CORONARY ARTERY BYPASS GRAFT  1999   DEXA     HERNIA  REPAIR     LEFT HEART CATH AND CORS/GRAFTS ANGIOGRAPHY N/A 05/24/2020   Procedure: LEFT HEART CATH AND CORS/GRAFTS ANGIOGRAPHY;  Surgeon: Dann Candyce RAMAN, MD;  Location: [facility] INVASIVE CV LAB;  Service: Cardiovascular;  Laterality: N/A;   prostate U/S + Biopsy  2002   RHINOPLASTY     several epidural injections     2004-2008   UPPER GASTROINTESTINAL ENDOSCOPY      SOCIAL HISTORY: Social History   Socioeconomic History   Marital status: Married    Spouse name: Erminio   Number of children: 0   Years of  education: Not on file   Highest education level: Not on file  Occupational History   Occupation: Retired  Tobacco Use   Smoking status: Never   Smokeless tobacco: Never  Vaping Use   Vaping status: Never Used  Substance and Sexual Activity   Alcohol  use: No    Alcohol /week: 0.0 standard drinks of alcohol    Drug use: No   Sexual activity: Not on file  Other Topics Concern   Not on file  Social History Narrative   Married, wife is a Engineer, civil (consulting), no children   Retired Engineer, agricultural, flies as a hobby   2 caffeinated beverages daily   Social Drivers of Corporate investment banker Strain: Not on Ship broker Insecurity: Not on file  Transportation Needs: Not on file  Physical Activity: Not on file  Stress: Not on file  Social Connections: Not on file  Intimate Partner Violence: Not on file    FAMILY HISTORY: Family History  Problem Relation Age of Onset   Osteoarthritis Mother    Hyperlipidemia Mother    Heart disease Mother    Heart attack Mother 80   Hyperlipidemia Father    Heart disease Father    Heart attack Father 37   Colonic polyp Father    Heart attack Brother 39   Diabetes Brother    Aneurysm Maternal Grandmother    Heart attack Maternal Grandmother    Aneurysm Maternal Grandfather    Heart attack Maternal Grandfather    Aneurysm Paternal Uncle    Colon cancer Neg Hx    Stomach cancer Neg Hx    Esophageal cancer Neg Hx    Rectal cancer Neg Hx     ALLERGIES:  is allergic to lipitor [atorvastatin].  MEDICATIONS:  Current Outpatient Medications  Medication Sig Dispense Refill   acetaminophen  (TYLENOL ) 500 MG tablet Take 500 mg by mouth every 6 (six) hours as needed (pain).     Ascorbic Acid (VITAMIN C) 1000 MG tablet Take 1,000 mg by mouth daily.     aspirin  EC 81 MG tablet Take 81 mg by mouth every Monday, Tuesday, Wednesday, Thursday, and Friday. Swallow whole.     CALCIUM -VITAMIN D PO Take 1 tablet by mouth 3 (three) times a week.       cyanocobalamin  (,VITAMIN B-12,) 1000 MCG/ML injection Inject 1,000 mcg into the muscle every 30 (thirty) days.     ezetimibe  (ZETIA ) 10 MG tablet Take 10 mg by mouth 3 (three) times a week.     fluticasone  (FLONASE ) 50 MCG/ACT nasal spray Place 2 sprays into both nostrils daily as needed for allergies or rhinitis.      folic acid  (FOLVITE ) 1 MG tablet Take 1 mg by mouth 3 (three) times a week.     gabapentin  (NEURONTIN ) 300 MG capsule TAKE 1 CAPSULE AT BEDTIME. 90 capsule 0   hydrocortisone  (ANUSOL -HC) 2.5 % rectal cream Place 1  Application rectally 2 (two) times daily as needed for hemorrhoids or anal itching. 30 g 1   isosorbide  mononitrate (IMDUR ) 30 MG 24 hr tablet TAKE (1/2) TABLET DAILY. 45 tablet 3   metoCLOPramide  (REGLAN ) 5 MG tablet Take 1 tablet (5 mg total) by mouth 3 (three) times daily before meals. 90 tablet 3   metoprolol  succinate (TOPROL -XL) 25 MG 24 hr tablet TAKE 1/2 TABLET BY MOUTH AT BEDTIME 45 tablet 3   nifedipine  0.3 % ointment Place 1 Application rectally 4 (four) times daily. 30 g 0   nitroGLYCERIN  (NITROSTAT ) 0.4 MG SL tablet Place 1 tablet (0.4 mg total) under the tongue every 5 (five) minutes as needed for chest pain. 25 tablet 1   pantoprazole  (PROTONIX ) 20 MG tablet TAKE ONE TABLET BY MOUTH ONCE DAILY BEFORE BREAKFAST 30 tablet 11   rosuvastatin  (CRESTOR ) 20 MG tablet TAKE 1 TABLET BY MOUTH ONCE DAILY. 90 tablet 2   silodosin (RAPAFLO) 8 MG CAPS capsule Take 8 mg by mouth at bedtime as needed (difficulty urinating).      testosterone  cypionate (DEPOTESTOTERONE CYPIONATE) 200 MG/ML injection Inject 50 mg into the muscle once a week. AS DIRECTED (0.25 ml)     No current facility-administered medications for this visit.    REVIEW OF SYSTEMS:   Constitutional: ( - ) fevers, ( - )  chills , ( - ) night sweats Eyes: ( - ) blurriness of vision, ( - ) double vision, ( - ) watery eyes Ears, nose, mouth, throat, and face: ( - ) mucositis, ( - ) sore throat Respiratory: (  - ) cough, ( - ) dyspnea, ( - ) wheezes Cardiovascular: ( - ) palpitation, ( - ) chest discomfort, ( - ) lower extremity swelling Gastrointestinal:  ( - ) nausea, ( - ) heartburn, ( - ) change in bowel habits Skin: ( - ) abnormal skin rashes Lymphatics: ( - ) new lymphadenopathy, ( - ) easy bruising Neurological: ( - ) numbness, ( - ) tingling, ( - ) new weaknesses Behavioral/Psych: ( - ) mood change, ( - ) new changes  All other systems were reviewed with the patient and are negative.  PHYSICAL EXAMINATION:  Vitals:   12/16/23 1419  BP: 108/62  Pulse: 61  Resp: 13  Temp: 97.8 F (36.6 C)  SpO2: 100%    Filed Weights   12/16/23 1419  Weight: 139 lb 12.8 oz (63.4 kg)     GENERAL: Well-appearing elderly Caucasian male, alert, no distress and comfortable SKIN: skin color, texture, turgor are normal, no rashes or significant lesions EYES: conjunctiva are pink and non-injected, sclera clear NECK: supple, non-tender LYMPH:  no palpable lymphadenopathy in the cervical, axillary or inguinal LUNGS: clear to auscultation and percussion with normal breathing effort HEART: regular rate & rhythm and no murmurs and no lower extremity edema Musculoskeletal: no cyanosis of digits and no clubbing  PSYCH: alert & oriented x 3, fluent speech NEURO: no focal motor/sensory deficits  LABORATORY DATA:  I have reviewed the data as listed    Latest Ref Rng & Units 12/16/2023    2:09 PM 12/18/2022   11:55 AM 10/31/2022    2:28 PM  CBC  WBC 4.0 - 10.5 K/uL 6.3  7.3  6.8   Hemoglobin 13.0 - 17.0 g/dL 88.4  87.3  86.8   Hematocrit 39.0 - 52.0 % 34.3  36.1  38.9   Platelets 150 - 400 K/uL 210  232  282  Latest Ref Rng & Units 12/18/2022   11:55 AM 11/08/2022    3:06 PM 10/31/2022    2:28 PM  CMP  Glucose 70 - 99 mg/dL 86  89  82   BUN 8 - 23 mg/dL 19  11  7    Creatinine 0.61 - 1.24 mg/dL 8.96  8.96  9.05   Sodium 135 - 145 mmol/L 140  138  141   Potassium 3.5 - 5.1 mmol/L 4.0  3.9  4.3    Chloride 98 - 111 mmol/L 103  102  101   CO2 22 - 32 mmol/L 32  30  25   Calcium  8.9 - 10.3 mg/dL 9.4  9.3  9.7   Total Protein 6.5 - 8.1 g/dL 6.2  6.6    Total Bilirubin 0.3 - 1.2 mg/dL 0.8  1.0    Alkaline Phos 38 - 126 U/L 83  78    AST 15 - 41 U/L 11  11    ALT 0 - 44 U/L 10  6      No results found for: MPROTEIN Lab Results  Component Value Date   KPAFRELGTCHN 0.64 03/23/2009   KPAFRELGTCHN 1.55 11/11/2008   KPAFRELGTCHN 1.56 05/12/2008   LAMBDASER 0.87 03/23/2009   LAMBDASER 1.21 11/11/2008   LAMBDASER 1.06 05/12/2008   KAPLAMBRATIO 0.74 03/23/2009   KAPLAMBRATIO 1.28 11/11/2008   KAPLAMBRATIO 1.47 05/12/2008    RADIOGRAPHIC STUDIES: No results found.  ASSESSMENT & PLAN LINDBERG ZENON 78 y.o. male with medical history significant for follicular lymphoma who presents for a follow up visit.   Per Dr. Tauna Prior note:   Dilan has long had back problems and has been followed for this by Dr. Colon.  On November 20, 2005, the patient had an MRI of the lumbar spine without contrast for evaluation of his chronic progressive low back and left buttock pain.  This showed confluent retroperitoneal adenopathy encasing the renal vessels and measuring up to 5.2 x 3.4 cm transversely. There was no epidural mass and the kidneys appeared unremarkable.  This had been compared with an MRI from Nov 09, 2004 where no such adenopathy was noted.     The patient's wife, Erminio, is a Customer service manager at [employer], so she brought this to the attention of Dr. Nicholaus and a CT Scan of the chest, abdomen and pelvis was obtained there on June 11th.  It was ready by India Han, showing some small mediastinal lymph nodes, the largest being 1.4 cm, but significant adenopathy surrounding the left renal vein with anterior displacement of that vein as well as the inferior vena cava.  This measured 5.2 cm in transverse diameter and 3.8 cm in AP diameter.  The adenopathy extended inferiorly to the level  of the aortic bifurcation.  There were small mesenteric lymph nodes, all less than 12 mm. The spleen was upper normal in size with no focal lesions.  There was no pelvic adenopathy.   # Follicular Lymphoma-under surveillance -- At this time findings are consistent with a low-grade follicular lymphoma treated in 2007 and has not required treatment since. -- Per NCCN recommendations we will continue observation at this time.  No signs or symptoms concerning for recurrence at this time. -- Last PET CT scan in August 2022.  No clear indication for repeat imaging as patient were to have symptoms or concerning changes in his blood work.  --Patient underwent CT abdomen pelvis on 11/27/2022.  No evidence of residual/recurrent lymphoma.  Will order CT  chest with contrast per patient request. -- Labs today show white blood cell count *** --No evidence that recurrent lymphoma is causing his symptoms.  Additionally no evidence of a hematological abnormality to explain his poor appetite and fatigue. -- Return to clinic in 1 years time or sooner if he would have any cytopenias or signs or symptoms concerning for recurrent disease.  No orders of the defined types were placed in this encounter.   All questions were answered. The patient knows to call the clinic with any problems, questions or concerns.  A total of more than 30 minutes were spent on this encounter with face-to-face time and non-face-to-face time, including preparing to see the patient, ordering tests and/or medications, counseling the patient and coordination of care as outlined above.   Norleen IVAR Kidney, MD Department of Hematology/Oncology [facility] Phone: [phone] Pager: [phone] Email: [email or web address]  12/16/2023 2:23 PM

## 2023-12-17 ENCOUNTER — Encounter: Payer: Self-pay | Admitting: Oncology

## 2023-12-24 ENCOUNTER — Ambulatory Visit: Payer: Self-pay | Admitting: *Deleted

## 2023-12-24 NOTE — Telephone Encounter (Addendum)
 Contacted patient per MD request with message below. Spoke with Ms. Scoggins. She verbalized understanding of information. Encouraged Mrs/Mr Magill to contact office with any questions or concerns.    ----- Message from Nurse Almarie DASEN sent at 12/23/2023  4:38 PM EDT -----  ----- Message ----- From: Federico Norleen DASEN MADISON, MD Sent: 12/17/2023   3:16 PM EDT To: Almarie DELENA Arabia, RN  Please let Mr. Allen know that his iron levels were normal.  For now the cause of his anemia is unclear.  We will continue to monitor closely.  We will see him back in 1 year as scheduled unless he  would develop any new or worsening findings. ----- Message ----- From: Rebecka, [facility] [place] Sent: 12/16/2023   2:16 PM EDT To: Norleen DASEN Federico MADISON, MD

## 2023-12-27 ENCOUNTER — Other Ambulatory Visit: Payer: Self-pay | Admitting: Internal Medicine

## 2023-12-27 ENCOUNTER — Encounter: Payer: Self-pay | Admitting: Internal Medicine

## 2023-12-27 ENCOUNTER — Ambulatory Visit: Attending: Internal Medicine | Admitting: Internal Medicine

## 2023-12-27 VITALS — BP 116/60 | HR 52 | Ht 70.0 in | Wt 136.3 lb

## 2023-12-27 DIAGNOSIS — I25118 Atherosclerotic heart disease of native coronary artery with other forms of angina pectoris: Secondary | ICD-10-CM | POA: Insufficient documentation

## 2023-12-27 MED ORDER — NITROGLYCERIN 0.4 MG SL SUBL: 0.4000 mg | SUBLINGUAL_TABLET | SUBLINGUAL | 11 refills | Status: AC | PRN

## 2023-12-27 NOTE — Progress Notes (Signed)
 HPI Willie Garcia returns today for followup. He is a pleasant 78 yo man with a  h/o CAD s/p CABG, follicular lymphoma. He has preserved LV function. He does not have typical chest pain. He was placed on imdur  and returns today for followup. He denies chest pain or sob. No syncope. No edema. He has had some increased bp but his weakness is improved.  Allergies  Allergen Reactions   Lipitor [Atorvastatin]     Weakness, myalgia      Current Outpatient Medications  Medication Sig Dispense Refill   acetaminophen  (TYLENOL ) 500 MG tablet Take 500 mg by mouth every 6 (six) hours as needed (pain).     Ascorbic Acid (VITAMIN C) 1000 MG tablet Take 1,000 mg by mouth daily.     aspirin  EC 81 MG tablet Take 81 mg by mouth every Monday, Tuesday, Wednesday, Thursday, and Friday. Swallow whole.     CALCIUM -VITAMIN D PO Take 1 tablet by mouth 3 (three) times a week.      cyanocobalamin  (,VITAMIN B-12,) 1000 MCG/ML injection Inject 1,000 mcg into the muscle every 30 (thirty) days.     ezetimibe  (ZETIA ) 10 MG tablet Take 10 mg by mouth 3 (three) times a week.     fluticasone  (FLONASE ) 50 MCG/ACT nasal spray Place 2 sprays into both nostrils daily as needed for allergies or rhinitis.      folic acid  (FOLVITE ) 1 MG tablet Take 1 mg by mouth 3 (three) times a week.     gabapentin  (NEURONTIN ) 300 MG capsule TAKE 1 CAPSULE AT BEDTIME. 90 capsule 0   hydrocortisone  (ANUSOL -HC) 2.5 % rectal cream Place 1 Application rectally 2 (two) times daily as needed for hemorrhoids or anal itching. 30 g 1   isosorbide  mononitrate (IMDUR ) 30 MG 24 hr tablet TAKE (1/2) TABLET DAILY. 45 tablet 3   metoCLOPramide  (REGLAN ) 5 MG tablet Take 1 tablet (5 mg total) by mouth 3 (three) times daily before meals. 90 tablet 3   metoprolol  succinate (TOPROL -XL) 25 MG 24 hr tablet TAKE 1/2 TABLET BY MOUTH AT BEDTIME 45 tablet 3   nifedipine  0.3 % ointment Place 1 Application rectally 4 (four) times daily. 30 g 0   nitroGLYCERIN   (NITROSTAT ) 0.4 MG SL tablet Place 1 tablet (0.4 mg total) under the tongue every 5 (five) minutes as needed for chest pain. 25 tablet 1   pantoprazole  (PROTONIX ) 20 MG tablet TAKE ONE TABLET BY MOUTH ONCE DAILY BEFORE BREAKFAST 30 tablet 11   rosuvastatin  (CRESTOR ) 20 MG tablet TAKE 1 TABLET BY MOUTH ONCE DAILY. 90 tablet 2   silodosin (RAPAFLO) 8 MG CAPS capsule Take 8 mg by mouth at bedtime as needed (difficulty urinating).      testosterone  cypionate (DEPOTESTOTERONE CYPIONATE) 200 MG/ML injection Inject 50 mg into the muscle once a week. AS DIRECTED (0.25 ml)     No current facility-administered medications for this visit.     Past Medical History:  Diagnosis Date   Allergic rhinitis    Anemia    Anxiety    Basal cell carcinoma of skin 2015   BPH (benign prostatic hyperplasia)    CAD (coronary artery disease)    Depression    Ganglion cyst    left thumb   GERD (gastroesophageal reflux disease)    HTN (hypertension)    Hyperlipidemia    Left inguinal hernia    Non Hodgkin's lymphoma (HCC)    Osteopenia    Personal history of colonic adenomas 04/04/2010  Scoliosis    Spinal stenosis    30 degree curve in back    ROS:   All systems reviewed and negative except as noted in the HPI.   Past Surgical History:  Procedure Laterality Date   basal cell cancer removed left eyelid   2019   CARDIAC CATHETERIZATION  07/14/2014   Procedure: LEFT HEART CATH AND CORS/GRAFTS ANGIOGRAPHY;  Surgeon: Candyce GORMAN Reek, MD;  Location: [facility] CATH LAB;  Service: Cardiovascular;;   CARDIAC CATHETERIZATION  07/14/2014   Procedure: INTRAVASCULAR PRESSURE WIRE/FFR STUDY;  Surgeon: Candyce GORMAN Reek, MD;  Location: [facility] CATH LAB;  Service: Cardiovascular;;  circ   CERVICAL SPINE SURGERY     C4-C6   COLONOSCOPY  06/18/2013   negative   CORONARY ARTERY BYPASS GRAFT  1999   DEXA     HERNIA REPAIR     LEFT HEART CATH AND CORS/GRAFTS ANGIOGRAPHY N/A 05/24/2020   Procedure: LEFT HEART CATH AND  CORS/GRAFTS ANGIOGRAPHY;  Surgeon: Reek Candyce GORMAN, MD;  Location: [facility] INVASIVE CV LAB;  Service: Cardiovascular;  Laterality: N/A;   prostate U/S + Biopsy  2002   RHINOPLASTY     several epidural injections     2004-2008   UPPER GASTROINTESTINAL ENDOSCOPY       Family History  Problem Relation Age of Onset   Osteoarthritis Mother    Hyperlipidemia Mother    Heart disease Mother    Heart attack Mother 48   Hyperlipidemia Father    Heart disease Father    Heart attack Father 55   Colonic polyp Father    Heart attack Brother 81   Diabetes Brother    Aneurysm Maternal Grandmother    Heart attack Maternal Grandmother    Aneurysm Maternal Grandfather    Heart attack Maternal Grandfather    Aneurysm Paternal Uncle    Colon cancer Neg Hx    Stomach cancer Neg Hx    Esophageal cancer Neg Hx    Rectal cancer Neg Hx      Social History   Socioeconomic History   Marital status: Married    Spouse name: Erminio   Number of children: 0   Years of education: Not on file   Highest education level: Not on file  Occupational History   Occupation: Retired  Tobacco Use   Smoking status: Never   Smokeless tobacco: Never  Vaping Use   Vaping status: Never Used  Substance and Sexual Activity   Alcohol  use: No    Alcohol /week: 0.0 standard drinks of alcohol    Drug use: No   Sexual activity: Not on file  Other Topics Concern   Not on file  Social History Narrative   Married, wife is a Engineer, civil (consulting), no children   Retired Engineer, agricultural, flies as a hobby   2 caffeinated beverages daily   Social Drivers of Corporate investment banker Strain: Not on Ship broker Insecurity: Not on file  Transportation Needs: Not on file  Physical Activity: Not on file  Stress: Not on file  Social Connections: Not on file  Intimate Partner Violence: Not on file     BP 116/60   Pulse (!) 52   Ht 5' 10 (1.778 m)   Wt 136 lb 4.8 oz (61.8 kg)   SpO2 97%   BMI 19.56 kg/m   Physical  Exam:  Well appearing NAD HEENT: Unremarkable Neck:  No JVD, no thyromegally Lymphatics:  No adenopathy Back:  No CVA tenderness Lungs:  Clear HEART:  Regular  rate rhythm, no murmurs, no rubs, no clicks Abd:  soft, positive bowel sounds, no organomegally, no rebound, no guarding Ext:  2 plus pulses, no edema, no cyanosis, no clubbing Skin:  No rashes no nodules Neuro:  CN II through XII intact, motor grossly intact  EKG  DEVICE  Normal device function.  See PaceArt for details.   Assess/Plan:  1.CAD - he is s/p CABG. He does not have angina. He will continue aggressive medical therapy. I encouraged him to stop the imdur  if he decides to use viagra or others. 2. HTN - his bp is well controlled. It tends to be low and I asked him to stop the imdur .  3. Dyslipidemia - he will continue crestor . 4. ED - if he decides to try sildenafil or others, then he would need to stop imdur  at least 3 days.    Danelle Dorleen Kissel,MD

## 2023-12-27 NOTE — Telephone Encounter (Signed)
 Pt of Dr. Waddell. He is being seen by Dr. Waddell as a Gen Card. Last seen by Barnie Hila in 10/2022. His Nitroglycerin  is needing refilled. Is this ok to fill under Dr. Waddell? Please advise.

## 2023-12-27 NOTE — Patient Instructions (Addendum)
 Medication Instructions:  Your physician recommends that you continue on your current medications as directed. Please refer to the Current Medication list given to you today.  *If you need a refill on your cardiac medications before your next appointment, please call your pharmacy*  Lab Work: None ordered.  You may go to any [facility] Location for your lab work:  [facility] - [address] ([facility]) - [address] [phone] [address]  [place] - [address]   [place]  - [address]   [place] - [address] - [address] [employer]  - [address] - [address] ([facility]   If you have labs (blood work) drawn today and your tests are completely normal, you will receive your results only by: [employer] (if you have MyChart)  If you have any lab test that is abnormal or we need to change your treatment, we will call you or send a MyChart message to review the results.  Testing/Procedures: None ordered.  Follow-Up: At [facility], you and your health needs are our priority.  As part of our continuing mission to provide you with exceptional heart care, we have created designated Provider Care Teams.  These Care Teams include your primary Cardiologist (physician) and Advanced Practice Providers (APPs -  Physician Assistants and Nurse Practitioners) who all work together to provide you with the care you need, when you need it.  Your next appointment:   1 year(s)  The format for your next appointment:   In Person  Provider:   Dr Kate

## 2024-01-07 DIAGNOSIS — M5416 Radiculopathy, lumbar region: Secondary | ICD-10-CM | POA: Diagnosis not present

## 2024-02-11 DIAGNOSIS — L570 Actinic keratosis: Secondary | ICD-10-CM | POA: Diagnosis not present

## 2024-02-11 DIAGNOSIS — L820 Inflamed seborrheic keratosis: Secondary | ICD-10-CM | POA: Diagnosis not present

## 2024-02-11 DIAGNOSIS — L723 Sebaceous cyst: Secondary | ICD-10-CM | POA: Diagnosis not present

## 2024-02-11 DIAGNOSIS — Z85828 Personal history of other malignant neoplasm of skin: Secondary | ICD-10-CM | POA: Diagnosis not present

## 2024-03-12 DIAGNOSIS — Z23 Encounter for immunization: Secondary | ICD-10-CM | POA: Diagnosis not present

## 2024-04-09 DIAGNOSIS — M5416 Radiculopathy, lumbar region: Secondary | ICD-10-CM | POA: Diagnosis not present

## 2024-04-13 DIAGNOSIS — E7849 Other hyperlipidemia: Secondary | ICD-10-CM | POA: Diagnosis not present

## 2024-04-13 DIAGNOSIS — R5383 Other fatigue: Secondary | ICD-10-CM | POA: Diagnosis not present

## 2024-04-13 DIAGNOSIS — K219 Gastro-esophageal reflux disease without esophagitis: Secondary | ICD-10-CM | POA: Diagnosis not present

## 2024-04-13 DIAGNOSIS — E291 Testicular hypofunction: Secondary | ICD-10-CM | POA: Diagnosis not present

## 2024-04-13 DIAGNOSIS — E785 Hyperlipidemia, unspecified: Secondary | ICD-10-CM | POA: Diagnosis not present

## 2024-04-13 DIAGNOSIS — D649 Anemia, unspecified: Secondary | ICD-10-CM | POA: Diagnosis not present

## 2024-04-13 DIAGNOSIS — E538 Deficiency of other specified B group vitamins: Secondary | ICD-10-CM | POA: Diagnosis not present

## 2024-04-13 DIAGNOSIS — M8589 Other specified disorders of bone density and structure, multiple sites: Secondary | ICD-10-CM | POA: Diagnosis not present

## 2024-04-13 DIAGNOSIS — I10 Essential (primary) hypertension: Secondary | ICD-10-CM | POA: Diagnosis not present

## 2024-04-20 DIAGNOSIS — N401 Enlarged prostate with lower urinary tract symptoms: Secondary | ICD-10-CM | POA: Diagnosis not present

## 2024-04-20 DIAGNOSIS — F4321 Adjustment disorder with depressed mood: Secondary | ICD-10-CM | POA: Diagnosis not present

## 2024-04-20 DIAGNOSIS — Z1331 Encounter for screening for depression: Secondary | ICD-10-CM | POA: Diagnosis not present

## 2024-04-20 DIAGNOSIS — M51362 Other intervertebral disc degeneration, lumbar region with discogenic back pain and lower extremity pain: Secondary | ICD-10-CM | POA: Diagnosis not present

## 2024-04-20 DIAGNOSIS — E291 Testicular hypofunction: Secondary | ICD-10-CM | POA: Diagnosis not present

## 2024-04-20 DIAGNOSIS — Z1339 Encounter for screening examination for other mental health and behavioral disorders: Secondary | ICD-10-CM | POA: Diagnosis not present

## 2024-04-20 DIAGNOSIS — K219 Gastro-esophageal reflux disease without esophagitis: Secondary | ICD-10-CM | POA: Diagnosis not present

## 2024-04-20 DIAGNOSIS — I2510 Atherosclerotic heart disease of native coronary artery without angina pectoris: Secondary | ICD-10-CM | POA: Diagnosis not present

## 2024-04-20 DIAGNOSIS — M5030 Other cervical disc degeneration, unspecified cervical region: Secondary | ICD-10-CM | POA: Diagnosis not present

## 2024-04-20 DIAGNOSIS — N138 Other obstructive and reflux uropathy: Secondary | ICD-10-CM | POA: Diagnosis not present

## 2024-04-20 DIAGNOSIS — C8590 Non-Hodgkin lymphoma, unspecified, unspecified site: Secondary | ICD-10-CM | POA: Diagnosis not present

## 2024-04-20 DIAGNOSIS — Z0000 Encounter for general adult medical examination without abnormal findings: Secondary | ICD-10-CM | POA: Diagnosis not present

## 2024-04-20 DIAGNOSIS — I10 Essential (primary) hypertension: Secondary | ICD-10-CM | POA: Diagnosis not present

## 2024-04-20 DIAGNOSIS — E785 Hyperlipidemia, unspecified: Secondary | ICD-10-CM | POA: Diagnosis not present

## 2024-05-07 DIAGNOSIS — E291 Testicular hypofunction: Secondary | ICD-10-CM | POA: Diagnosis not present

## 2024-05-07 DIAGNOSIS — R351 Nocturia: Secondary | ICD-10-CM | POA: Diagnosis not present

## 2024-05-07 DIAGNOSIS — R350 Frequency of micturition: Secondary | ICD-10-CM | POA: Diagnosis not present

## 2024-05-07 DIAGNOSIS — N5201 Erectile dysfunction due to arterial insufficiency: Secondary | ICD-10-CM | POA: Diagnosis not present

## 2024-05-07 DIAGNOSIS — N401 Enlarged prostate with lower urinary tract symptoms: Secondary | ICD-10-CM | POA: Diagnosis not present

## 2024-05-11 ENCOUNTER — Other Ambulatory Visit: Payer: Self-pay | Admitting: Internal Medicine

## 2024-05-12 DIAGNOSIS — L570 Actinic keratosis: Secondary | ICD-10-CM | POA: Diagnosis not present

## 2024-05-12 DIAGNOSIS — D2339 Other benign neoplasm of skin of other parts of face: Secondary | ICD-10-CM | POA: Diagnosis not present

## 2024-05-12 DIAGNOSIS — D485 Neoplasm of uncertain behavior of skin: Secondary | ICD-10-CM | POA: Diagnosis not present

## 2024-05-12 DIAGNOSIS — L821 Other seborrheic keratosis: Secondary | ICD-10-CM | POA: Diagnosis not present

## 2024-05-12 DIAGNOSIS — Z85828 Personal history of other malignant neoplasm of skin: Secondary | ICD-10-CM | POA: Diagnosis not present

## 2024-06-03 ENCOUNTER — Other Ambulatory Visit: Payer: Self-pay | Admitting: Internal Medicine

## 2024-12-28 ENCOUNTER — Other Ambulatory Visit

## 2024-12-28 ENCOUNTER — Ambulatory Visit: Admitting: Hematology and Oncology
